# Patient Record
Sex: Male | Born: 1950 | Race: White | Hispanic: No | Marital: Married | Smoking: Former smoker
Health system: Southern US, Community
[De-identification: ages and names within clinical notes are randomized; demographics above are authoritative.]

## PROBLEM LIST (undated history)

## (undated) DIAGNOSIS — J189 Pneumonia, unspecified organism: Secondary | ICD-10-CM

## (undated) DIAGNOSIS — K219 Gastro-esophageal reflux disease without esophagitis: Secondary | ICD-10-CM

## (undated) DIAGNOSIS — C801 Malignant (primary) neoplasm, unspecified: Secondary | ICD-10-CM

## (undated) DIAGNOSIS — M1990 Unspecified osteoarthritis, unspecified site: Secondary | ICD-10-CM

## (undated) DIAGNOSIS — E785 Hyperlipidemia, unspecified: Secondary | ICD-10-CM

## (undated) DIAGNOSIS — L570 Actinic keratosis: Secondary | ICD-10-CM

## (undated) DIAGNOSIS — D125 Benign neoplasm of sigmoid colon: Secondary | ICD-10-CM

## (undated) DIAGNOSIS — K635 Polyp of colon: Secondary | ICD-10-CM

## (undated) DIAGNOSIS — K5792 Diverticulitis of intestine, part unspecified, without perforation or abscess without bleeding: Secondary | ICD-10-CM

## (undated) DIAGNOSIS — E349 Endocrine disorder, unspecified: Secondary | ICD-10-CM

## (undated) HISTORY — DX: Benign neoplasm of sigmoid colon: D12.5

## (undated) HISTORY — DX: Gastro-esophageal reflux disease without esophagitis: K21.9

## (undated) HISTORY — DX: Actinic keratosis: L57.0

## (undated) HISTORY — DX: Diverticulitis of intestine, part unspecified, without perforation or abscess without bleeding: K57.92

## (undated) HISTORY — PX: SHOULDER ARTHROSCOPY: SHX128

## (undated) HISTORY — DX: Unspecified osteoarthritis, unspecified site: M19.90

## (undated) HISTORY — DX: Polyp of colon: K63.5

## (undated) HISTORY — DX: Hyperlipidemia, unspecified: E78.5

## (undated) NOTE — *Deleted (*Deleted)
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  PROCEDURE:  Revision left total hip arthroplasty.  COMPONENTS USED:  DePuy hip system with a size 15 mm small stature AML 6-inch stem with a new 36+1.5 delta ceramic ball and a new 54 x 36+4 neutral AltrX liner.  SURGEON:  Durene Romans, MD  ASSISTANT:  Dennie Bible, PA-C.  Note that Ms. Adrian Blackwater was present for the entirety of the case from preoperative positioning, perioperative management of the operative extremity, general facilitation of the case and primary wound closure.  ANESTHESIA:  General.  SPECIMENS:  None.  COMPLICATIONS:  None noted.  BLOOD LOSS:  About 250 mL.  DRAINS:  None.  INDICATIONS:  The patient is a very pleasant 23 year old male with history of left total hip arthroplasty about 4 years ago.  He presented recently with complaints of knee and thigh pain.  Upon review of his radiographs, there was some concern not  necessarily of inherent knee pathology, but femoral component subsidence with pedestal formation distally and concerns for loosening around his femoral stem.  Comparing his immediate postoperative films to current films indicated concern for subsidence  or settling of the component.  Confirmational bone scan was ordered and indicated loosening of the stem.  We discussed the indications for surgery.  We discussed the risk of recurrence, lack of ingrowth given revised components.  We discussed the  postoperative course and expectation, reviewed standard risk of infection, DVT,  dislocation.  Consent was obtained for benefit of pain relief.  PROCEDURE IN DETAIL:  The patient was brought to the operative theater.  Once adequate anesthesia, preoperative antibiotics, Ancef administered as well as tranexamic  Dictation Ends Here  HN/NUANCE  D:06/22/2020 T:06/22/2020 JOB:[id number]

---
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
## 1988-08-05 HISTORY — PX: EYE SURGERY: SHX253

## 2006-09-15 ENCOUNTER — Ambulatory Visit (HOSPITAL_COMMUNITY): Admission: RE | Admit: 2006-09-15 | Discharge: 2006-09-15 | Payer: Self-pay | Admitting: Family Medicine

## 2006-09-29 ENCOUNTER — Ambulatory Visit: Payer: Self-pay | Admitting: Internal Medicine

## 2006-10-10 ENCOUNTER — Ambulatory Visit: Payer: Self-pay | Admitting: Internal Medicine

## 2006-10-10 HISTORY — PX: COLONOSCOPY: SHX174

## 2010-08-05 DIAGNOSIS — E349 Endocrine disorder, unspecified: Secondary | ICD-10-CM

## 2010-08-05 HISTORY — DX: Endocrine disorder, unspecified: E34.9

## 2011-09-19 ENCOUNTER — Encounter: Payer: Self-pay | Admitting: *Deleted

## 2011-09-24 ENCOUNTER — Ambulatory Visit (INDEPENDENT_AMBULATORY_CARE_PROVIDER_SITE_OTHER): Payer: BC Managed Care – PPO | Admitting: Cardiology

## 2011-09-24 ENCOUNTER — Encounter: Payer: Self-pay | Admitting: Cardiology

## 2011-09-24 DIAGNOSIS — Z01818 Encounter for other preprocedural examination: Secondary | ICD-10-CM

## 2011-09-24 DIAGNOSIS — E785 Hyperlipidemia, unspecified: Secondary | ICD-10-CM

## 2011-09-24 NOTE — Patient Instructions (Signed)
Start aspirin 81mg  daily after your surgery has been done.  Your physician recommends that you do not need to  schedule a follow-up appointment with Dr Shirlee Latch.

## 2011-09-25 DIAGNOSIS — Z01818 Encounter for other preprocedural examination: Secondary | ICD-10-CM | POA: Insufficient documentation

## 2011-09-25 DIAGNOSIS — E785 Hyperlipidemia, unspecified: Secondary | ICD-10-CM | POA: Insufficient documentation

## 2011-09-25 NOTE — Progress Notes (Signed)
PCP: Dr. Christell Constant  61 yo with history history of hyperlipidemia presents for cardiology evaluation prior to left THR with Dr. Charlann Boxer.   Patient in general is quite healthy.  No cardiopulmonary complaints.  He no longer jogs because of his hip pain but does not have any problems with walking briskly or climbing steps.  He swims for about 1/2 hour daily and lifts weights.  No chest pain or tightness.  No exertional dyspnea.  No history of syncope or lightheadedness.  He has never had heart trouble.  He does not smoke (quit years ago).  No family history of premature CAD.    ECG: NSR, normal  Labs (11/12): LDL-P 918 (low), LDL 74, HDL 56, TSH normal, K 5.6, creatinine 1.16  PMH: 1. Hyperlipidemia 2. Left hip OA 3. H/o diverticulitis 4. ETT about 5 years ago was normal.   FH: Father with dementia, mother with renal cell CA.  No family history of premature CAD.   SH: Married, 2 children.  Occasional ETOH, former smoker years ago.  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  Lives in [place].    ROS: All systems reviewed and negative except as per HPI.   Current Outpatient Prescriptions  Medication Sig Dispense Refill  . ANDROGEL PUMP 20.25 MG/ACT (1.62%) GEL Three times a day.      . cholecalciferol (VITAMIN D) 1000 UNITS tablet Take 1,000 Units by mouth daily.      . fish oil-omega-3 fatty acids 1000 MG capsule Take by mouth daily.      . multivitamin (THERAGRAN) per tablet Take 1 tablet by mouth daily.      Marland Kitchen SIMCOR 1000-40 MG TB24 Take 1,000 mg by mouth Daily.      Marland Kitchen aspirin (ASPIRIN CHILDRENS) 81 MG chewable tablet Chew 1 tablet (81 mg total) by mouth daily.        BP 110/72  Pulse 67  Ht 6\' 1"  (1.854 m)  Wt 201 lb 12.8 oz (91.536 kg)  BMI 26.62 kg/m2 General: NAD Neck: No JVD, no thyromegaly or thyroid nodule.  Lungs: Clear to auscultation bilaterally with normal respiratory effort. CV: Nondisplaced PMI.  Heart regular S1/S2, no S3/S4, no murmur.  No peripheral edema.  No carotid  bruit.  Normal pedal pulses.  Abdomen: Soft, nontender, no hepatosplenomegaly, no distention.  Skin: Intact without lesions or rashes.  Neurologic: Alert and oriented x 3.  Psych: Normal affect. Extremities: No clubbing or cyanosis.  HEENT: Normal.

## 2011-09-25 NOTE — Progress Notes (Signed)
H&P performed 09/25/2011 Dictation # [id number]

## 2011-09-25 NOTE — Assessment & Plan Note (Signed)
No cardiopulmonary limitations with excellent exercise tolerance.  ECG is normal.  No further testing needed prior to orthopedic surgery.  After surgery, would have him start ASA 81 mg daily.

## 2011-09-25 NOTE — Assessment & Plan Note (Signed)
Excellent lipid profile on Simcor.

## 2011-09-26 ENCOUNTER — Encounter (HOSPITAL_COMMUNITY): Payer: Self-pay | Admitting: Pharmacy Technician

## 2011-09-26 ENCOUNTER — Encounter (HOSPITAL_COMMUNITY): Payer: Self-pay

## 2011-09-26 ENCOUNTER — Encounter (HOSPITAL_COMMUNITY)
Admission: RE | Admit: 2011-09-26 | Discharge: 2011-09-26 | Disposition: A | Discharge status: Home / Self Care | Payer: BC Managed Care – PPO | Source: Ambulatory Visit | Attending: Orthopedic Surgery | Admitting: Orthopedic Surgery

## 2011-09-26 HISTORY — DX: Unspecified osteoarthritis, unspecified site: M19.90

## 2011-09-26 HISTORY — DX: Pneumonia, unspecified organism: J18.9

## 2011-09-26 LAB — COMPREHENSIVE METABOLIC PANEL
ALT: 34 U/L (ref 0–53)
AST: 37 U/L (ref 0–37)
Albumin: 4.4 g/dL (ref 3.5–5.2)
Alkaline Phosphatase: 60 U/L (ref 39–117)
BUN: 16 mg/dL (ref 6–23)
CO2: 29 mEq/L (ref 19–32)
Calcium: 9.7 mg/dL (ref 8.4–10.5)
Chloride: 102 mEq/L (ref 96–112)
Creatinine, Ser: 1.07 mg/dL (ref 0.50–1.35)
GFR calc Af Amer: 85 mL/min — ABNORMAL LOW (ref >90)
GFR calc non Af Amer: 74 mL/min — ABNORMAL LOW (ref >90)
Glucose, Bld: 104 mg/dL — ABNORMAL HIGH (ref 70–99)
Potassium: 3.9 mEq/L (ref 3.5–5.1)
Sodium: 140 mEq/L (ref 135–145)
Total Bilirubin: 1 mg/dL (ref 0.3–1.2)
Total Protein: 7.6 g/dL (ref 6.0–8.3)

## 2011-09-26 LAB — SURGICAL PCR SCREEN
MRSA, PCR: NEGATIVE
Staphylococcus aureus: NEGATIVE

## 2011-09-26 LAB — CBC
HCT: 44.9 % (ref 39.0–52.0)
Hemoglobin: 15.9 g/dL (ref 13.0–17.0)
MCH: 33.3 pg (ref 26.0–34.0)
MCHC: 35.4 g/dL (ref 30.0–36.0)
MCV: 93.9 fL (ref 78.0–100.0)
Platelets: 136 10*3/uL — ABNORMAL LOW (ref 150–400)
RBC: 4.78 MIL/uL (ref 4.22–5.81)
RDW: 12.7 % (ref 11.5–15.5)
WBC: 3.5 10*3/uL — ABNORMAL LOW (ref 4.0–10.5)

## 2011-09-26 LAB — URINALYSIS, ROUTINE W REFLEX MICROSCOPIC
Bilirubin Urine: NEGATIVE
Glucose, UA: NEGATIVE mg/dL
Hgb urine dipstick: NEGATIVE
Ketones, ur: NEGATIVE mg/dL
Leukocytes, UA: NEGATIVE
Nitrite: NEGATIVE
Protein, ur: NEGATIVE mg/dL
Specific Gravity, Urine: 1.028 (ref 1.005–1.030)
Urobilinogen, UA: 0.2 mg/dL (ref 0.0–1.0)
pH: 6 (ref 5.0–8.0)

## 2011-09-26 LAB — DIFFERENTIAL
Basophils Absolute: 0 10*3/uL (ref 0.0–0.1)
Basophils Relative: 1 % (ref 0–1)
Eosinophils Absolute: 0.1 10*3/uL (ref 0.0–0.7)
Eosinophils Relative: 2 % (ref 0–5)
Lymphocytes Relative: 36 % (ref 12–46)
Lymphs Abs: 1.3 10*3/uL (ref 0.7–4.0)
Monocytes Absolute: 0.3 10*3/uL (ref 0.1–1.0)
Monocytes Relative: 9 % (ref 3–12)
Neutro Abs: 1.8 10*3/uL (ref 1.7–7.7)
Neutrophils Relative %: 52 % (ref 43–77)

## 2011-09-26 LAB — PROTIME-INR
INR: 0.96 (ref 0.00–1.49)
Prothrombin Time: 13 seconds (ref 11.6–15.2)

## 2011-09-26 LAB — APTT: aPTT: 39 seconds — ABNORMAL HIGH (ref 24–37)

## 2011-09-26 MED ORDER — CEFAZOLIN SODIUM 1-5 GM-% IV SOLN: 1.0000 g | INTRAVENOUS | Status: DC

## 2011-09-26 MED ORDER — CHLORHEXIDINE GLUCONATE 4 % EX LIQD
60.0000 mL | Freq: Once | CUTANEOUS | Status: DC
  Filled 2011-09-26: qty 60

## 2011-09-26 NOTE — Pre-Procedure Instructions (Signed)
09/24/11 OV with Dr Shirlee Latch for clearance in [facility]

## 2011-09-26 NOTE — H&P (Signed)
[name], SNOWBALL                ACCOUNT NO.:  [id number]  MEDICAL RECORD NO.:  [id number]  LOCATION:                               FACILITY:  [facility]  PHYSICIAN:  Madlyn Frankel. Charlann Boxer, M.D.  DATE OF BIRTH:  1950/12/08  DATE OF ADMISSION:  10/01/2011 DATE OF DISCHARGE:                             HISTORY & PHYSICAL   DATE OF SURGERY:  October 01, 2011.  ADMITTING DIAGNOSIS:  Left hip osteoarthritis.  HISTORY OF PRESENT ILLNESS:  This is a 61 year old gentleman with a history of osteoarthritis of the left hip that has failed conservative management.  At this time, the patient is scheduled for total hip arthroplasty by anterior approach.  The surgery's benefits, and aftercare were discussed in detail with the patient, questions invited and answered.  Note that the patient's medical doctor is Dr. Rudi Heap.  He is a candidate for tranexamic acid and will receive that in preop.  He is planning on going home after surgery.  He was given his home medications of aspirin, iron, MiraLAX, Robaxin, and Colace.  PAST MEDICAL HISTORY:  Drug allergies none.  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  PREVIOUS SURGERIES:  None.  CURRENT MEDICATIONS: 1. Simcor 1000 mg daily, and 2. AndroGel 1.62% t.i.d.  FAMILY HISTORY:  Positive for Alzheimer's, and renal cell cancer.  SOCIAL HISTORY:  The patient is married.  He does not smoke and has 1-2 beers per day.  Again, he is planning on going home after surgery.  REVIEW OF SYSTEMS:  CENTRAL NERVOUS SYSTEM:  Negative for headache, blurred vision, or dizziness.  PULMONARY:  Negative for shortness breath, PND, and orthopnea.  CARDIOVASCULAR:  Negative for chest pain or palpitation.  GI:  Negative for ulcers, hepatitis.  GU:  Negative for urinary tract difficulty.  MUSCULOSKELETAL:  Positive as in HPI.  PHYSICAL EXAMINATION:  GENERAL:  This is a well-developed, well- nourished gentleman, in no acute  distress. VITAL SIGNS:  Blood pressure 145/88, respirations 14, pulse 72 and regular.  HEENT:  Head normocephalic.  Nose patent.  Ears patent. Pupils equal, round, reactive to light.  Throat without injection. NECK:  Supple without adenopathy.  Carotids 2+ without bruit. CHEST:  Clear to auscultation.  No rales or rhonchi.  Respirations 14. HEART:  Regular rate and rhythm at 72 beats per minute without murmur. ABDOMEN:  Soft.  Active bowel sounds.  No masses or organomegaly. NEUROLOGIC:  Patient alert, oriented to time, place and person.  Cranial nerves 2-12 grossly intact. EXTREMITIES:  Show the left hip with full extension, further flexion to 120 degrees, external rotation of 35, internal rotation of 20 with discomfort.  Dorsalis pedis and posterior tibialis pulses are 2+.  ASSESSMENT:  Osteoarthritis, left hip.  PLAN:  Total hip arthroplasty, left hip, by anterior approach.     Jaquelyn Bitter. Tymier Lindholm, P.A.   ______________________________ Madlyn Frankel Charlann Boxer, M.D.    SJC/MEDQ  D:  09/25/2011  T:  09/26/2011  Job:  [id number]

## 2011-09-26 NOTE — Patient Instructions (Signed)
[name]  09/26/2011   Your procedure is scheduled on:  10/01/11 1220pm-1350pm  Report to [facility] at 1000 AM.  Call this number if you have problems the morning of surgery: [phone]   Remember:   Do not eat food:After Midnight.  May have clear liquids:until Midnight .    Take these medicines the morning of surgery with A SIP OF WATER:    Do not wear jewelry,   Do not wear lotions, powders, or perfumes. .    Do not bring valuables to the hospital.  Contacts, dentures or bridgework may not be worn into surgery.  Leave suitcase in the car. After surgery it may be brought to your room.  For patients admitted to the hospital, checkout time is 11:00 AM the day of discharge.       Special Instructions: CHG Shower Use Special Wash: 1/2 bottle night before surgery and 1/2 bottle morning of surgery. shower chin to toes with CHG.  Wash face and private parts with regular soap.     Please read over the following fact sheets that you were given: MRSA Information, coughing and deep breathing exercises, leg exercises, Blood Transfusion Fact Sheet, Incentive Fact Sheet

## 2011-09-30 ENCOUNTER — Encounter (HOSPITAL_COMMUNITY): Payer: Self-pay | Admitting: Pharmacy Technician

## 2011-10-01 ENCOUNTER — Inpatient Hospital Stay (HOSPITAL_COMMUNITY): Payer: BC Managed Care – PPO

## 2011-10-01 ENCOUNTER — Encounter (HOSPITAL_COMMUNITY): Payer: Self-pay | Admitting: Anesthesiology

## 2011-10-01 ENCOUNTER — Encounter (HOSPITAL_COMMUNITY): Payer: Self-pay | Admitting: *Deleted

## 2011-10-01 ENCOUNTER — Inpatient Hospital Stay (HOSPITAL_COMMUNITY)
Admission: RE | Admit: 2011-10-01 | Discharge: 2011-10-02 | DRG: 818 | Disposition: A | Discharge status: Home Health | Payer: BC Managed Care – PPO | Source: Ambulatory Visit | Attending: Orthopedic Surgery | Admitting: Orthopedic Surgery

## 2011-10-01 ENCOUNTER — Inpatient Hospital Stay (HOSPITAL_COMMUNITY): Payer: BC Managed Care – PPO | Admitting: Anesthesiology

## 2011-10-01 ENCOUNTER — Encounter (HOSPITAL_COMMUNITY)
Admission: RE | Disposition: A | Discharge status: Home Health | Payer: Self-pay | Source: Ambulatory Visit | Attending: Orthopedic Surgery

## 2011-10-01 DIAGNOSIS — E349 Endocrine disorder, unspecified: Secondary | ICD-10-CM | POA: Diagnosis present

## 2011-10-01 DIAGNOSIS — E7800 Pure hypercholesterolemia, unspecified: Secondary | ICD-10-CM | POA: Diagnosis present

## 2011-10-01 DIAGNOSIS — M169 Osteoarthritis of hip, unspecified: Principal | ICD-10-CM | POA: Diagnosis present

## 2011-10-01 DIAGNOSIS — Z01812 Encounter for preprocedural laboratory examination: Secondary | ICD-10-CM

## 2011-10-01 DIAGNOSIS — Z96649 Presence of unspecified artificial hip joint: Secondary | ICD-10-CM

## 2011-10-01 DIAGNOSIS — Z79899 Other long term (current) drug therapy: Secondary | ICD-10-CM

## 2011-10-01 DIAGNOSIS — M1610 Unilateral primary osteoarthritis, unspecified hip: Principal | ICD-10-CM | POA: Diagnosis present

## 2011-10-01 HISTORY — PX: TOTAL HIP ARTHROPLASTY: SHX124

## 2011-10-01 LAB — ABO/RH: ABO/RH(D): O POS

## 2011-10-01 LAB — TYPE AND SCREEN
ABO/RH(D): O POS
Antibody Screen: NEGATIVE

## 2011-10-01 SURGERY — ARTHROPLASTY, HIP, TOTAL, ANTERIOR APPROACH
Anesthesia: General | Site: Hip | Laterality: Left | Wound class: Clean

## 2011-10-01 MED ORDER — DIPHENHYDRAMINE HCL 25 MG PO CAPS: 25.0000 mg | ORAL_CAPSULE | Freq: Four times a day (QID) | ORAL | Status: DC | PRN

## 2011-10-01 MED ORDER — LACTATED RINGERS IV SOLN
INTRAVENOUS | Status: DC | PRN
  Administered 2011-10-01 (×3): via INTRAVENOUS

## 2011-10-01 MED ORDER — PROMETHAZINE HCL 25 MG/ML IJ SOLN: 6.2500 mg | INTRAMUSCULAR | Status: DC | PRN

## 2011-10-01 MED ORDER — SIMVASTATIN 40 MG PO TABS
40.0000 mg | ORAL_TABLET | Freq: Every day | ORAL | Status: DC
  Administered 2011-10-02: 40 mg via ORAL
  Filled 2011-10-01: qty 1

## 2011-10-01 MED ORDER — LACTATED RINGERS IV SOLN
INTRAVENOUS | Status: DC
  Administered 2011-10-01: 16:00:00 via INTRAVENOUS

## 2011-10-01 MED ORDER — METHOCARBAMOL 500 MG PO TABS
500.0000 mg | ORAL_TABLET | Freq: Four times a day (QID) | ORAL | Status: DC | PRN
  Administered 2011-10-02: 500 mg via ORAL
  Filled 2011-10-01: qty 1

## 2011-10-01 MED ORDER — ACETAMINOPHEN 325 MG PO TABS: 650.0000 mg | ORAL_TABLET | Freq: Four times a day (QID) | ORAL | Status: DC | PRN

## 2011-10-01 MED ORDER — ONDANSETRON HCL 4 MG/2ML IJ SOLN: 4.0000 mg | Freq: Four times a day (QID) | INTRAMUSCULAR | Status: DC | PRN

## 2011-10-01 MED ORDER — PHENOL 1.4 % MT LIQD
1.0000 | OROMUCOSAL | Status: DC | PRN
  Filled 2011-10-01: qty 177

## 2011-10-01 MED ORDER — MIDAZOLAM HCL 5 MG/5ML IJ SOLN
INTRAMUSCULAR | Status: DC | PRN
  Administered 2011-10-01: 2 mg via INTRAVENOUS

## 2011-10-01 MED ORDER — HYDROCODONE-ACETAMINOPHEN 7.5-325 MG PO TABS
1.0000 | ORAL_TABLET | ORAL | Status: DC
  Administered 2011-10-01: 2 via ORAL
  Administered 2011-10-01: 1 via ORAL
  Administered 2011-10-02: 2 via ORAL
  Administered 2011-10-02: 1 via ORAL
  Administered 2011-10-02 (×2): 2 via ORAL
  Filled 2011-10-01 (×4): qty 2
  Filled 2011-10-01: qty 1
  Filled 2011-10-01: qty 2

## 2011-10-01 MED ORDER — MENTHOL 3 MG MT LOZG
1.0000 | LOZENGE | OROMUCOSAL | Status: DC | PRN
  Filled 2011-10-01: qty 9

## 2011-10-01 MED ORDER — ROCURONIUM BROMIDE 100 MG/10ML IV SOLN
INTRAVENOUS | Status: DC | PRN
  Administered 2011-10-01: 10 mg via INTRAVENOUS
  Administered 2011-10-01: 20 mg via INTRAVENOUS
  Administered 2011-10-01: 50 mg via INTRAVENOUS

## 2011-10-01 MED ORDER — TRANEXAMIC ACID 100 MG/ML IV SOLN
1360.0000 mg | INTRAVENOUS | Status: AC
  Administered 2011-10-01: 1360 mg via INTRAVENOUS
  Filled 2011-10-01: qty 13.6

## 2011-10-01 MED ORDER — METHOCARBAMOL 100 MG/ML IJ SOLN
500.0000 mg | Freq: Four times a day (QID) | INTRAVENOUS | Status: DC | PRN
  Administered 2011-10-01 (×2): 500 mg via INTRAVENOUS
  Filled 2011-10-01 (×3): qty 5

## 2011-10-01 MED ORDER — PROPOFOL 10 MG/ML IV EMUL
INTRAVENOUS | Status: DC | PRN
  Administered 2011-10-01: 200 mg via INTRAVENOUS

## 2011-10-01 MED ORDER — LIDOCAINE HCL (CARDIAC) 20 MG/ML IV SOLN
INTRAVENOUS | Status: DC | PRN
  Administered 2011-10-01: 60 mg via INTRAVENOUS
  Administered 2011-10-01: 100 mg via INTRAVENOUS

## 2011-10-01 MED ORDER — ACETAMINOPHEN 10 MG/ML IV SOLN
INTRAVENOUS | Status: DC | PRN
  Administered 2011-10-01: 1000 mg via INTRAVENOUS

## 2011-10-01 MED ORDER — RIVAROXABAN 10 MG PO TABS
10.0000 mg | ORAL_TABLET | ORAL | Status: DC
  Administered 2011-10-02: 10 mg via ORAL
  Filled 2011-10-01: qty 1

## 2011-10-01 MED ORDER — HYDROMORPHONE HCL PF 1 MG/ML IJ SOLN
INTRAMUSCULAR | Status: DC | PRN
  Administered 2011-10-01 (×2): 0.5 mg via INTRAVENOUS

## 2011-10-01 MED ORDER — FLEET ENEMA 7-19 GM/118ML RE ENEM: 1.0000 | ENEMA | Freq: Once | RECTAL | Status: AC | PRN

## 2011-10-01 MED ORDER — ACETAMINOPHEN 650 MG RE SUPP: 650.0000 mg | Freq: Four times a day (QID) | RECTAL | Status: DC | PRN

## 2011-10-01 MED ORDER — ONDANSETRON HCL 4 MG PO TABS: 4.0000 mg | ORAL_TABLET | Freq: Four times a day (QID) | ORAL | Status: DC | PRN

## 2011-10-01 MED ORDER — FENTANYL CITRATE 0.05 MG/ML IJ SOLN
INTRAMUSCULAR | Status: DC | PRN
  Administered 2011-10-01 (×2): 50 ug via INTRAVENOUS
  Administered 2011-10-01: 100 ug via INTRAVENOUS
  Administered 2011-10-01: 50 ug via INTRAVENOUS

## 2011-10-01 MED ORDER — BISACODYL 5 MG PO TBEC: 5.0000 mg | DELAYED_RELEASE_TABLET | Freq: Every day | ORAL | Status: DC | PRN

## 2011-10-01 MED ORDER — DOCUSATE SODIUM 100 MG PO CAPS
100.0000 mg | ORAL_CAPSULE | Freq: Two times a day (BID) | ORAL | Status: DC
  Administered 2011-10-01 – 2011-10-02 (×2): 100 mg via ORAL
  Filled 2011-10-01 (×3): qty 1

## 2011-10-01 MED ORDER — SODIUM CHLORIDE 0.9 % IV SOLN
100.0000 mL/h | INTRAVENOUS | Status: DC
  Administered 2011-10-01 – 2011-10-02 (×2): 100 mL/h via INTRAVENOUS
  Filled 2011-10-01 (×6): qty 1000

## 2011-10-01 MED ORDER — METOCLOPRAMIDE HCL 5 MG/ML IJ SOLN: 5.0000 mg | Freq: Three times a day (TID) | INTRAMUSCULAR | Status: DC | PRN

## 2011-10-01 MED ORDER — HYDROMORPHONE HCL PF 1 MG/ML IJ SOLN
INTRAMUSCULAR | Status: AC
  Administered 2011-10-01: 0.5 mg via INTRAVENOUS
  Filled 2011-10-01: qty 2

## 2011-10-01 MED ORDER — ALUM & MAG HYDROXIDE-SIMETH 200-200-20 MG/5ML PO SUSP: 30.0000 mL | ORAL | Status: DC | PRN

## 2011-10-01 MED ORDER — NIACIN-SIMVASTATIN ER 1000-40 MG PO TB24: 1000.0000 mg | ORAL_TABLET | Freq: Every morning | ORAL | Status: DC

## 2011-10-01 MED ORDER — FERROUS SULFATE 325 (65 FE) MG PO TABS
325.0000 mg | ORAL_TABLET | Freq: Three times a day (TID) | ORAL | Status: DC
  Administered 2011-10-01 – 2011-10-02 (×3): 325 mg via ORAL
  Filled 2011-10-01 (×4): qty 1

## 2011-10-01 MED ORDER — POLYETHYLENE GLYCOL 3350 17 G PO PACK
17.0000 g | PACK | Freq: Two times a day (BID) | ORAL | Status: DC
  Administered 2011-10-02: 17 g via ORAL
  Filled 2011-10-01 (×3): qty 1

## 2011-10-01 MED ORDER — GLYCOPYRROLATE 0.2 MG/ML IJ SOLN
INTRAMUSCULAR | Status: DC | PRN
  Administered 2011-10-01: 0.2 mg via INTRAVENOUS

## 2011-10-01 MED ORDER — ONDANSETRON HCL 4 MG/2ML IJ SOLN
INTRAMUSCULAR | Status: DC | PRN
  Administered 2011-10-01: 4 mg via INTRAVENOUS

## 2011-10-01 MED ORDER — CEFAZOLIN SODIUM-DEXTROSE 2-3 GM-% IV SOLR
2.0000 g | Freq: Once | INTRAVENOUS | Status: AC
  Administered 2011-10-01: 2 g via INTRAVENOUS

## 2011-10-01 MED ORDER — ZOLPIDEM TARTRATE 5 MG PO TABS: 5.0000 mg | ORAL_TABLET | Freq: Every evening | ORAL | Status: DC | PRN

## 2011-10-01 MED ORDER — CEFAZOLIN SODIUM-DEXTROSE 2-3 GM-% IV SOLR
2.0000 g | Freq: Four times a day (QID) | INTRAVENOUS | Status: AC
  Administered 2011-10-01 – 2011-10-02 (×3): 2 g via INTRAVENOUS
  Filled 2011-10-01 (×3): qty 50

## 2011-10-01 MED ORDER — HYDROMORPHONE HCL PF 1 MG/ML IJ SOLN: 0.5000 mg | INTRAMUSCULAR | Status: DC | PRN

## 2011-10-01 MED ORDER — METOCLOPRAMIDE HCL 10 MG PO TABS: 5.0000 mg | ORAL_TABLET | Freq: Three times a day (TID) | ORAL | Status: DC | PRN

## 2011-10-01 MED ORDER — NIACIN ER 500 MG PO CPCR
1000.0000 mg | ORAL_CAPSULE | Freq: Every day | ORAL | Status: DC
  Administered 2011-10-02: 1000 mg via ORAL
  Filled 2011-10-01: qty 2

## 2011-10-01 MED ORDER — HYDROMORPHONE HCL PF 1 MG/ML IJ SOLN
0.2500 mg | INTRAMUSCULAR | Status: DC | PRN
  Administered 2011-10-01 (×4): 0.5 mg via INTRAVENOUS

## 2011-10-01 SURGICAL SUPPLY — 45 items
ADH SKN CLS APL DERMABOND .7 (GAUZE/BANDAGES/DRESSINGS) ×1
BAG SPEC THK2 15X12 ZIP CLS (MISCELLANEOUS) ×2
BAG ZIPLOCK 12X15 (MISCELLANEOUS) ×4 IMPLANT
BLADE SAW SGTL 18X1.27X75 (BLADE) ×2 IMPLANT
CELLS DAT CNTRL 66122 CELL SVR (MISCELLANEOUS) IMPLANT
CLOTH BEACON ORANGE TIMEOUT ST (SAFETY) ×2 IMPLANT
DERMABOND ADVANCED (GAUZE/BANDAGES/DRESSINGS) ×1
DERMABOND ADVANCED .7 DNX12 (GAUZE/BANDAGES/DRESSINGS) ×1 IMPLANT
DRAPE C-ARM 42X72 X-RAY (DRAPES) ×2 IMPLANT
DRAPE STERI IOBAN 125X83 (DRAPES) ×2 IMPLANT
DRAPE U-SHAPE 47X51 STRL (DRAPES) ×6 IMPLANT
DRSG AQUACEL AG ADV 3.5X10 (GAUZE/BANDAGES/DRESSINGS) ×2 IMPLANT
DRSG TEGADERM 4X4.75 (GAUZE/BANDAGES/DRESSINGS) ×1 IMPLANT
DURAPREP 26ML APPLICATOR (WOUND CARE) ×2 IMPLANT
ELECT BLADE TIP CTD 4 INCH (ELECTRODE) ×2 IMPLANT
ELECT REM PT RETURN 9FT ADLT (ELECTROSURGICAL) ×2
ELECTRODE REM PT RTRN 9FT ADLT (ELECTROSURGICAL) ×1 IMPLANT
EVACUATOR 1/8 PVC DRAIN (DRAIN) ×1 IMPLANT
FACESHIELD LNG OPTICON STERILE (SAFETY) ×8 IMPLANT
GAUZE SPONGE 2X2 8PLY STRL LF (GAUZE/BANDAGES/DRESSINGS) ×1 IMPLANT
GLOVE BIOGEL PI IND STRL 7.5 (GLOVE) ×1 IMPLANT
GLOVE BIOGEL PI IND STRL 8 (GLOVE) ×1 IMPLANT
GLOVE BIOGEL PI INDICATOR 7.5 (GLOVE) ×1
GLOVE BIOGEL PI INDICATOR 8 (GLOVE) ×1
GLOVE ECLIPSE 8.0 STRL XLNG CF (GLOVE) ×3 IMPLANT
GLOVE INDICATOR 6.5 STRL GRN (GLOVE) ×1 IMPLANT
GLOVE ORTHO TXT STRL SZ7.5 (GLOVE) ×5 IMPLANT
GLOVE SURG SS PI 6.5 STRL IVOR (GLOVE) ×1 IMPLANT
GLOVE SURG SS PI 7.5 STRL IVOR (GLOVE) ×2 IMPLANT
GOWN BRE IMP PREV XXLGXLNG (GOWN DISPOSABLE) ×2 IMPLANT
GOWN STRL NON-REIN LRG LVL3 (GOWN DISPOSABLE) ×3 IMPLANT
GOWN STRL REIN XL XLG (GOWN DISPOSABLE) ×1 IMPLANT
KIT BASIN OR (CUSTOM PROCEDURE TRAY) ×2 IMPLANT
PACK TOTAL JOINT (CUSTOM PROCEDURE TRAY) ×2 IMPLANT
PADDING CAST COTTON 6X4 STRL (CAST SUPPLIES) ×2 IMPLANT
RETRACTOR WND ALEXIS 18 MED (MISCELLANEOUS) ×1 IMPLANT
RTRCTR WOUND ALEXIS 18CM MED (MISCELLANEOUS)
SPONGE GAUZE 2X2 STER 10/PKG (GAUZE/BANDAGES/DRESSINGS) ×1
SUCTION FRAZIER 12FR DISP (SUCTIONS) ×2 IMPLANT
SUT MNCRL AB 4-0 PS2 18 (SUTURE) ×2 IMPLANT
SUT VIC AB 1 CT1 36 (SUTURE) ×8 IMPLANT
SUT VIC AB 2-0 CT1 27 (SUTURE) ×4
SUT VIC AB 2-0 CT1 TAPERPNT 27 (SUTURE) ×2 IMPLANT
TOWEL OR 17X26 10 PK STRL BLUE (TOWEL DISPOSABLE) ×4 IMPLANT
TRAY FOLEY CATH 14FRSI W/METER (CATHETERS) ×2 IMPLANT

## 2011-10-01 NOTE — Op Note (Signed)
NAME:  [name]                ACCOUNT NO.: [id number]      MEDICAL RECORD NO.: [id number]      FACILITY:  [facility]      PHYSICIAN:  Durene Romans D  DATE OF BIRTH:  [birth year]     DATE OF PROCEDURE:  10/01/2011                                 OPERATIVE REPORT         PREOPERATIVE DIAGNOSIS: Left  hip osteoarthritis.      POSTOPERATIVE DIAGNOSIS:  Left hip osteoarthritis.      PROCEDURE:  Left total hip replacement through an anterior approach   utilizing DePuy THR system, component size 54mm pinnacle cup, a size 36+4 neutral   Altrex liner, a size 8Hi Tri Lock stem with a 36+1.5 delta ceramic   ball.      SURGEON:  Madlyn Frankel. Charlann Boxer, M.D.      ASSISTANT:  Lanney Gins, PA      ANESTHESIA:  General.      SPECIMENS:  None.      COMPLICATIONS:  None.      BLOOD LOSS:  800 cc     DRAINS:  One Hemovac.      INDICATION OF THE PROCEDURE:  [name] is a 61 y.o. male who had   presented to office for evaluation of left hip pain.  Radiographs revealed   progressive degenerative changes with bone-on-bone   articulation to the  hip joint.  The patient had painful limited range of   motion significantly affecting their overall quality of life.  The patient was failing to    respond to conservative measures, and at this point was ready   to proceed with more definitive measures.  The patient has noted progressive   degenerative changes in his hip, progressive problems and dysfunction   with regarding the hip prior to surgery.  Consent was obtained for   benefit of pain relief.  Specific risk of infection, DVT, component   failure, dislocation, need for revision surgery, as well discussion of   the anterior versus posterior approach were reviewed.  Consent was   obtained for benefit of anterior pain relief through an anterior   approach.      PROCEDURE IN DETAIL:  The patient was brought to operative theater.   Once adequate anesthesia, preoperative antibiotics, 2gm Ancef  administered.   The patient was positioned supine on the OSI Hanna table.  Once adequate   padding of boney process was carried out, we had predraped out the hip, and  used fluoroscopy to confirm orientation of the pelvis and position.      The left hip was then prepped and draped from proximal iliac crest to   mid thigh with shower curtain technique.      Time-out was performed identifying the patient, planned procedure, and   extremity.     An incision was then made 2 cm distal and lateral to the   anterior superior iliac spine extending over the orientation of the   tensor fascia lata muscle and sharp dissection was carried down to the   fascia of the muscle and protractor placed in the soft tissues.      The fascia was then incised.  The muscle belly was identified and swept   laterally  and retractor placed along the superior neck.  Following   cauterization of the circumflex vessels and removing some pericapsular   fat, a second cobra retractor was placed on the inferior neck.  A third   retractor was placed on the anterior acetabulum after elevating the   anterior rectus.  A L-capsulotomy was along the line of the   superior neck to the trochanteric fossa, then extended proximally and   distally.  Tag sutures were placed and the retractors were then placed   intracapsular.  We then identified the trochanteric fossa and   orientation of my neck cut, confirmed this radiographically   and then made a neck osteotomy with the femur on traction.  The femoral   head was removed without difficulty or complication.  Traction was let   off and retractors were placed posterior and anterior around the   acetabulum.      The labrum and foveal tissue were debrided.  I began reaming with a 47mm   reamer and reamed up to 53mm reamer with good bony bed preparation and a 54mm   cup was chosen.  The final 54 Pinnacle cup was then impacted under fluoroscopy  to confirm the depth of penetration and  orientation with respect to   abduction.  A screw was placed followed by the hole eliminator.  The final   36+4 neutral Altrex liner was impacted with good visualized rim fit.  The cup was positioned anatomically within the acetabular portion of the pelvis.      At this point, the femur was rolled at 80 degrees.  Further capsule was   released off the inferior aspect of the femoral neck.  I then   released the superior capsule proximally.  The hook was placed laterally   along the femur and elevated manually and held in position with the bed   hook.  The leg was then extended and adducted with the leg rolled to 100   degrees of external rotation.  Once the proximal femur was fully   exposed, I used a box osteotome to set orientation.  I then began   broaching with the starting chili pepper broach and passed this by hand and then broached up to 8.  With the 8 broach in place I chose a high offset neck with a 36+1.5 ball and did a trial reduction.  The offset was appropriate, leg lengths   appeared to be equal, confirmed radiographically.   Given these findings, I went ahead and dislocated the hip, repositioned all   retractors and positioned the right hip in the extended and abducted position.  The final 8 Hi  Tri Lock stem was   chosen and it was impacted down to the level of neck cut.  Based on this   and the trial reduction, a 36+1.5 delta ceramic ball was chosen and   impacted onto a clean and dry trunnion, and the hip was reduced.  The   hip had been irrigated throughout the case again at this point.  I did   reapproximate the superior capsular leaflet to the anterior leaflet   using #1 Vicryl, placed a medium Hemovac drain deep.  The fascia of the   tensor fascia lata muscle was then reapproximated using #1 Vicryl.  The   remaining wound was closed with 2-0 Vicryl and running 4-0 Monocryl.   The hip was cleaned, dried, and dressed sterilely using Dermabond and   Aquacel dressing.  Drain  site dressed  separately.  She was then brought   to recovery room in stable condition tolerating the procedure well.    Lanney Gins, PA-C was present for the entirety of the case involved from   preoperative positioning, perioperative retractor management, general   facilitation of the case, as well as primary wound closure as assistant.            Madlyn Frankel Charlann Boxer, M.D.            MDO/MEDQ  D:  05/28/2011  T:  05/28/2011  Job:  [id number]      Electronically Signed by Durene Romans M.D. on 06/03/2011 09:15:38 AM

## 2011-10-01 NOTE — Preoperative (Signed)
Beta Blockers   Reason not to administer Beta Blockers:Not Applicable 

## 2011-10-01 NOTE — Anesthesia Postprocedure Evaluation (Signed)
  Anesthesia Post-op Note  Patient: [name]  Procedure(s) Performed: Procedure(s) (LRB): TOTAL HIP ARTHROPLASTY ANTERIOR APPROACH (Left)  Patient Location: PACU  Anesthesia Type: General  Level of Consciousness: awake and alert   Airway and Oxygen Therapy: Patient Spontanous Breathing  Post-op Pain: mild  Post-op Assessment: Post-op Vital signs reviewed, Patient's Cardiovascular Status Stable, Respiratory Function Stable, Patent Airway and No signs of Nausea or vomiting  Post-op Vital Signs: stable  Complications: No apparent anesthesia complications

## 2011-10-01 NOTE — Anesthesia Preprocedure Evaluation (Addendum)
Anesthesia Evaluation  Patient identified by MRN, date of birth, ID band Patient awake    Reviewed: Allergy & Precautions, H&P , NPO status , Patient's Chart, lab work & pertinent test results  Airway Mallampati: II TM Distance: >3 FB Neck ROM: Full    Dental No notable dental hx.    Pulmonary  clear to auscultation  Pulmonary exam normal       Cardiovascular neg cardio ROS Regular Normal    Neuro/Psych Negative Neurological ROS  Negative Psych ROS   GI/Hepatic negative GI ROS, Neg liver ROS,   Endo/Other  Negative Endocrine ROS  Renal/GU negative Renal ROS  Genitourinary negative   Musculoskeletal negative musculoskeletal ROS (+)   Abdominal   Peds negative pediatric ROS (+)  Hematology negative hematology ROS (+)   Anesthesia Other Findings   Reproductive/Obstetrics negative OB ROS                           Anesthesia Physical Anesthesia Plan  ASA: I  Anesthesia Plan: General   Post-op Pain Management:    Induction: Intravenous  Airway Management Planned: Oral ETT  Additional Equipment:   Intra-op Plan:   Post-operative Plan: Extubation in OR  Informed Consent: I have reviewed the patients History and Physical, chart, labs and discussed the procedure including the risks, benefits and alternatives for the proposed anesthesia with the patient or authorized representative who has indicated his/her understanding and acceptance.   Dental advisory given  Plan Discussed with: CRNA  Anesthesia Plan Comments:         Anesthesia Quick Evaluation

## 2011-10-01 NOTE — Transfer of Care (Signed)
Immediate Anesthesia Transfer of Care Note  Patient: [name]  Procedure(s) Performed: Procedure(s) (LRB): TOTAL HIP ARTHROPLASTY ANTERIOR APPROACH (Left)  Patient Location: PACU  Anesthesia Type: General  Level of Consciousness: awake, alert , oriented and patient cooperative  Airway & Oxygen Therapy: Patient Spontanous Breathing and Patient connected to face mask oxygen  Post-op Assessment: Report given to PACU RN, Post -op Vital signs reviewed and stable and Patient moving all extremities X 4  Post vital signs: Reviewed and stable  Complications: No apparent anesthesia complications

## 2011-10-01 NOTE — Interval H&P Note (Signed)
History and Physical Interval Note:  10/01/2011 8:44 AM  [name]  has presented today for surgery, with the diagnosis of Left Hip Osetoarthritis  The various methods of treatment have been discussed with the patient and family. After consideration of risks, benefits and other options for treatment, the patient has consented to  Procedure(s) (LRB): LEFT TOTAL HIP ARTHROPLASTY ANTERIOR APPROACH (Left) as a surgical intervention .  The patients' history has been reviewed, patient examined, no change in status, stable for surgery.  I have reviewed the patients' chart and labs.  Questions were answered to the patient's satisfaction.     Shelda Pal

## 2011-10-02 LAB — BASIC METABOLIC PANEL
BUN: 9 mg/dL (ref 6–23)
CO2: 29 mEq/L (ref 19–32)
Calcium: 8.3 mg/dL — ABNORMAL LOW (ref 8.4–10.5)
Chloride: 106 mEq/L (ref 96–112)
Creatinine, Ser: 1.15 mg/dL (ref 0.50–1.35)
GFR calc Af Amer: 78 mL/min — ABNORMAL LOW (ref >90)
GFR calc non Af Amer: 67 mL/min — ABNORMAL LOW (ref >90)
Glucose, Bld: 115 mg/dL — ABNORMAL HIGH (ref 70–99)
Potassium: 3.7 mEq/L (ref 3.5–5.1)
Sodium: 140 mEq/L (ref 135–145)

## 2011-10-02 LAB — CBC
HCT: 35 % — ABNORMAL LOW (ref 39.0–52.0)
Hemoglobin: 12.1 g/dL — ABNORMAL LOW (ref 13.0–17.0)
MCH: 32.1 pg (ref 26.0–34.0)
MCHC: 34.6 g/dL (ref 30.0–36.0)
MCV: 92.8 fL (ref 78.0–100.0)
Platelets: 120 10*3/uL — ABNORMAL LOW (ref 150–400)
RBC: 3.77 MIL/uL — ABNORMAL LOW (ref 4.22–5.81)
RDW: 12.5 % (ref 11.5–15.5)
WBC: 8 10*3/uL (ref 4.0–10.5)

## 2011-10-02 MED ORDER — DSS 100 MG PO CAPS: 100.0000 mg | ORAL_CAPSULE | Freq: Two times a day (BID) | ORAL | Status: AC

## 2011-10-02 MED ORDER — ASPIRIN EC 325 MG PO TBEC: 325.0000 mg | DELAYED_RELEASE_TABLET | Freq: Two times a day (BID) | ORAL | Status: DC

## 2011-10-02 MED ORDER — DIPHENHYDRAMINE HCL 25 MG PO CAPS: 25.0000 mg | ORAL_CAPSULE | Freq: Four times a day (QID) | ORAL | Status: DC | PRN

## 2011-10-02 MED ORDER — POLYETHYLENE GLYCOL 3350 17 G PO PACK: 17.0000 g | PACK | Freq: Two times a day (BID) | ORAL | Status: AC

## 2011-10-02 MED ORDER — METHOCARBAMOL 500 MG PO TABS: 500.0000 mg | ORAL_TABLET | Freq: Four times a day (QID) | ORAL | Status: AC | PRN

## 2011-10-02 MED ORDER — HYDROCODONE-ACETAMINOPHEN 7.5-325 MG PO TABS: 1.0000 | ORAL_TABLET | ORAL | Status: AC

## 2011-10-02 MED ORDER — FERROUS SULFATE 325 (65 FE) MG PO TABS: 325.0000 mg | ORAL_TABLET | Freq: Three times a day (TID) | ORAL | Status: DC

## 2011-10-02 NOTE — Evaluation (Signed)
Physical Therapy Evaluation Patient Details Name: [name] MRN: [id number] DOB: June 29, 1951 Today's Date: 10/02/2011  Problem List:  Patient Active Problem List  Diagnoses  . Hyperlipidemia  . Preoperative evaluation of a medical condition to rule out surgical contraindications (TAR required)  . S/P left THA, AA    Past Medical History:  Past Medical History  Diagnosis Date  . Pneumonia     hx of years ago   . Arthritis     hip , shoulders    Past Surgical History: History reviewed. No pertinent past surgical history.  PT Assessment/Plan/Recommendation PT Assessment Clinical Impression Statement: pt is s/p L THA dir. ant. ambulated first time OOB.pt will benefit from PT to improve functional I reo, strength to DC to home. PT Recommendation/Assessment: Patient will need skilled PT in the acute care venue PT Problem List: Decreased strength;Decreased range of motion;Decreased activity tolerance;Decreased knowledge of use of DME PT Therapy Diagnosis : Difficulty walking PT Plan PT Frequency: 7X/week PT Recommendation Recommendations for Other Services: OT consult Follow Up Recommendations: Home health PT Equipment Recommended:  (crutches) PT Goals  Acute Rehab PT Goals PT Goal Formulation: With patient Time For Goal Achievement: 3 days Pt will go Supine/Side to Sit: Independently PT Goal: Supine/Side to Sit - Progress: Goal set today Pt will go Sit to Supine/Side: Independently PT Goal: Sit to Supine/Side - Progress: Goal set today Pt will go Sit to Stand: with supervision PT Goal: Sit to Stand - Progress: Goal set today Pt will go Stand to Sit: with supervision PT Goal: Stand to Sit - Progress: Goal set today Pt will Ambulate: >150 feet;with supervision;with least restrictive assistive device PT Goal: Ambulate - Progress: Goal set today Pt will Go Up / Down Stairs: 1-2 stairs;with supervision;with least restrictive assistive device PT Goal: Up/Down Stairs -  Progress: Goal set today Pt will Perform Home Exercise Program: Independently PT Goal: Perform Home Exercise Program - Progress: Goal set today  PT Evaluation Precautions/Restrictions    Prior Functioning  Home Living Lives With: Spouse Receives Help From: Family Type of Home: House Home Layout: One level Home Access: Stairs to enter Entrance Stairs-Rails: None Entrance Stairs-Number of Steps: 1 Home Adaptive Equipment: Walker - rolling Prior Function Level of Independence: Independent with basic ADLs Able to Take Stairs?: Yes Driving: Yes Vocation: Full time employment Cognition Cognition Overall Cognitive Status: Appears within functional limits for tasks assessed Orientation Level: Oriented X4 Sensation/Coordination   Extremity Assessment RLE Assessment RLE Assessment: Within Functional Limits LLE Assessment LLE Assessment: Exceptions to [facility] LLE Strength LLE Overall Strength Comments: pt able to flex knee in supine to 80 degr. with no assist. Mobility (including Balance) Bed Mobility Bed Mobility: Yes Supine to Sit: 4: Min assist;HOB flat;With rails Transfers Transfers: Yes Sit to Stand: 4: Min assist;From elevated surface;From bed;With upper extremity assist Sit to Stand Details (indicate cue type and reason): vc topush from bed Stand to Sit: 5: Supervision;With upper extremity assist;To chair/3-in-1;With armrests Stand to Sit Details: vc for LLE placed for comfort prior to sitting. Ambulation/Gait Ambulation/Gait: Yes Ambulation/Gait Assistance: 4: Min assist Ambulation/Gait Assistance Details (indicate cue type and reason): vc for position inside RWQ and step length on LLE Ambulation Distance (Feet): 200 Feet Assistive device: Rolling walker Gait Pattern: Step-through pattern Gait velocity: normal    Exercise  Total Joint Exercises Ankle Circles/Pumps: AROM;20 reps;Supine Quad Sets: AROM;10 reps;Supine Short Arc Quad: AROM;Left;10 reps;Supine Heel  Slides: AROM;Left;20 reps;Supine Hip ABduction/ADduction: AROM;Right;10 reps;Supine End of Session PT - End  of Session Activity Tolerance: Patient tolerated treatment well Patient left: in chair;with call bell in reach Nurse Communication: Mobility status for transfers General Behavior During Session: [facility] for tasks performed Cognition: [facility] for tasks performed  Rada Hay 10/02/2011, 11:04 AM

## 2011-10-02 NOTE — Progress Notes (Signed)
D/c instructions given and explained to pt.  Prescription for norco given to pt, placed in pt's hands.  Iv site d/c, placed pressure dressing over site.  Pt stable and ready for d/c.  Pt has 3-n-1 and crutches.  Pt d/c home with wife at this time.

## 2011-10-02 NOTE — Progress Notes (Signed)
CARE MANAGEMENT NOTE 10/02/2011  Patient:  [name], [name]   Account Number:  [id number]  Date Initiated:  10/02/2011  Documentation initiated by:  Colleen Can  Subjective/Objective Assessment:   dx total hip replacemnt-left, anterior approach     Action/Plan:   CM spoke with patient. Plans are for patient to return to his home in [place] where his spouse will be caregiver. Already has crutches and #n1 in room. States he will not require RW. Wants [facility] for [facility] services   Anticipated DC Date:  10/02/2011   Anticipated DC Plan:  HOME W HOME HEALTH SERVICES  In-house referral  NA      DC Planning Services  CM consult      [facility] Choice  HOME HEALTH   Choice offered to / List presented to:  C-1 Patient   DME arranged  NA      DME agency  NA     HH arranged  HH-2 PT      [facility] agency  [facility]   Status of service:  Completed, signed off Medicare Important Message given?  NO (If response is "NO", the following Medicare IM given date fields will be blank) Date Medicare IM given:   Date Additional Medicare IM given:    Discharge Disposition:  HOME W HOME HEALTH SERVICES  Per UR Regulation:    Comments:  List of agencies placed in shadow chart . Genevieve Norlander will start [facility] services tomorrow 10/03/11.

## 2011-10-02 NOTE — Progress Notes (Signed)
Subjective: 1 Day Post-Op Procedure(s) (LRB): TOTAL HIP ARTHROPLASTY ANTERIOR APPROACH (Left)   Patient reports pain as mild. No events. Ready to be discharged home.  Objective:   VITALS:   Filed Vitals:   10/02/11 0645  BP: 113/67  Pulse: 59  Temp: 98.5 F (36.9 C)  Resp: 16    Neurovascular intact Dorsiflexion/Plantar flexion intact Incision: dressing C/D/I No cellulitis present Compartment soft  LABS  Basename 10/02/11 0435  HGB 12.1*  HCT 35.0*  WBC 8.0  PLT 120*     Basename 10/02/11 0435  NA 140  K 3.7  BUN 9  CREATININE 1.15  GLUCOSE 115*     Assessment/Plan: 1 Day Post-Op Procedure(s) (LRB): TOTAL HIP ARTHROPLASTY ANTERIOR APPROACH (Left)   HV drain d/c'ed Foley cath d/c'ed Advance diet Up with therapy D/C IV fluids Discharge home with home health today Follow up in 2 weeks at [facility].  Follow-up Information    Follow up with OLIN,Havier Deeb D in 2 weeks.   Contact information:   [facility] [address], [address] [place] [address] [phone]          Anastasio Auerbach. Darell Saputo   PAC  10/02/2011, 9:32 AM

## 2011-10-02 NOTE — Discharge Summary (Signed)
Physician Discharge Summary  Patient ID: [name] MRN: [id number] DOB/AGE: 09/24/1950 61 y.o.  Admit date: 10/01/2011 Discharge date: 10/02/2011  Procedures:  Procedure(s) (LRB): TOTAL HIP ARTHROPLASTY ANTERIOR APPROACH (Left)  Attending Physician: Shelda Pal, MD   Admission Diagnoses: Left hip osteoarthritis  Discharge Diagnoses:  Principal Problem:  *S/P left THA, AA Hypercholesterolemia Hypotestosteronemia  HPI: This is a 61 year old gentleman with a history of osteoarthritis of the left hip that has failed conservative management. At this time, the patient is scheduled for total hip arthroplasty by anterior approach. The surgery's benefits, and aftercare were discussed in detail with the patient, questions invited and answered. Note that the patient's medical doctor is Dr. Rudi Heap. He is a candidate for tranexamic acid and will receive that in preop.  He is planning on going home after surgery. He was given his home medications of aspirin, iron, MiraLAX, Robaxin, and Colace.  PCP: Rudi Heap, MD, MD   Discharged Condition: good  Hospital Course:  Patient underwent the above stated procedure on 10/01/2011. Patient tolerated the procedure well and brought to the recovery room in good condition and subsequently to the floor.  POD #1 BP: 113/67 ; Pulse: 59 ; Temp: 98.5 F (36.9 C) ; Resp: 16 Pt's foley was removed, as well as the hemovac drain removed. IV was changed to a saline lock. Patient reports pain as mild. No events. Ready to be discharged home. Neurovascular intact, dorsiflexion/plantar flexion intact, incision: dressing C/D/I, no cellulitis present and compartment soft.   LABS  Basename  10/02/11 0435   HGB  12.1  HCT  35.0    Discharge Exam: General appearance: alert, cooperative and no distress Extremities: Homans sign is negative, no sign of DVT, no edema, redness or tenderness in the calves or thighs and no ulcers, gangrene or trophic  changes  Disposition: Home with HHPT, with follow up in 2 weeks  Follow-up Information    Follow up with OLIN,Ann-Marie Kluge D in 2 weeks.   Contact information:   [facility] [address] [place] [address] [phone]          Discharge Orders    Future Orders Please Complete By Expires   Diet - low sodium heart healthy      Call MD / Call 911      Comments:   If you experience chest pain or shortness of breath, CALL 911 and be transported to the hospital emergency room.  If you develope a fever above 101 F, pus (white drainage) or increased drainage or redness at the wound, or calf pain, call your surgeon's office.   Discharge instructions      Comments:   Maintain surgical dressing for 8 days, then replace with gauze and tape. Keep the area dry and clean until follow up. Follow up in 2 weeks at [facility]. Call with any questions or concerns.     Constipation Prevention      Comments:   Drink plenty of fluids.  Prune juice may be helpful.  You may use a stool softener, such as Colace (over the counter) 100 mg twice a day.  Use MiraLax (over the counter) for constipation as needed.   Increase activity slowly as tolerated      Weight Bearing as taught in Physical Therapy      Comments:   Use a walker or crutches as instructed.   Driving restrictions      Comments:   No driving for 4  weeks   Change dressing      Comments:   Maintain surgical dressing for 8 days, then replace with 4x4 guaze and tape. Keep the area dry and clean.   TED hose      Comments:   Use stockings (TED hose) for 2 weeks on both leg(s).  You may remove them at night for sleeping.      Current Discharge Medication List    START taking these medications   Details  aspirin EC 325 MG tablet Take 1 tablet (325 mg total) by mouth 2 (two) times daily. X 4 weeks Qty: 60 tablet, Refills: 0    diphenhydrAMINE (BENADRYL) 25 mg capsule Take 1 capsule  (25 mg total) by mouth every 6 (six) hours as needed for itching, allergies or sleep.    docusate sodium 100 MG CAPS Take 100 mg by mouth 2 (two) times daily.    ferrous sulfate 325 (65 FE) MG tablet Take 1 tablet (325 mg total) by mouth 3 (three) times daily after meals.    HYDROcodone-acetaminophen (NORCO) 7.5-325 MG per tablet Take 1-2 tablets by mouth every 4 (four) hours. Qty: 120 tablet, Refills: 0    methocarbamol (ROBAXIN) 500 MG tablet Take 1 tablet (500 mg total) by mouth every 6 (six) hours as needed (muscle spasms).    polyethylene glycol (MIRALAX / GLYCOLAX) packet Take 17 g by mouth 2 (two) times daily.      CONTINUE these medications which have NOT CHANGED   Details  ANDROGEL PUMP 20.25 MG/ACT (1.62%) GEL Three times a day.    cholecalciferol (VITAMIN D) 1000 UNITS tablet Take 1,000 Units by mouth every morning.     SIMCOR 1000-40 MG TB24 Take 1,000 mg by mouth every morning.     fish oil-omega-3 fatty acids 1000 MG capsule Take 1 g by mouth every morning.     multivitamin (THERAGRAN) per tablet Take 1 tablet by mouth every morning.       STOP taking these medications     aspirin (ASPIRIN CHILDRENS) 81 MG chewable tablet Comments:  Reason for Stopping:          Signed: Anastasio Auerbach. Narciso Stoutenburg   PAC  10/02/2011, 9:39 AM

## 2011-10-04 ENCOUNTER — Encounter (HOSPITAL_COMMUNITY): Payer: Self-pay | Admitting: Orthopedic Surgery

## 2012-01-04 ENCOUNTER — Encounter: Payer: Self-pay | Admitting: Cardiology

## 2012-01-28 ENCOUNTER — Ambulatory Visit (INDEPENDENT_AMBULATORY_CARE_PROVIDER_SITE_OTHER): Payer: BC Managed Care – PPO | Admitting: Internal Medicine

## 2012-01-28 ENCOUNTER — Encounter: Payer: Self-pay | Admitting: Internal Medicine

## 2012-01-28 VITALS — BP 138/72 | HR 64 | Ht 73.0 in | Wt 200.0 lb

## 2012-01-28 DIAGNOSIS — R12 Heartburn: Secondary | ICD-10-CM

## 2012-01-28 NOTE — Patient Instructions (Addendum)
Please read the GERD hand out you have been given today and try to follow the suggestions.  If symptoms become more frequent or worsen come back and see [place].  You are due for your colon in 2018.

## 2012-01-28 NOTE — Progress Notes (Signed)
Subjective:    Patient ID: [name], male    DOB: 09-01-1950, 61 y.o.   MRN: [id number] Referred by: Ernestina Penna, MD  HPI The patient is a pleasant middle-aged white man sent for evaluation of heartburn and reflux symptoms. At 2 months ago he took a Z-Pak and developed nausea and stomach upset increased heartburn. He was mentioning this to his primary care physician, Dr. Christell Constant and a recent visit. He was referred for evaluation of GERD symptoms. Since that time he is actually had less problems. About once a week he has heartburn. He drinks about 5 cups of coffee a day. The heartburn, when it occurs response to a glass of water. He does not have dysphagia or bleeding or unintentional weight loss in his GI review of systems is otherwise negative.  He had a screening colonoscopy about 5 years ago, he had 2 diminutive hyperplastic polyps in the sigmoid.  No Known Allergies Outpatient Prescriptions Prior to Visit  Medication Sig Dispense Refill  . ANDROGEL PUMP 20.25 MG/ACT (1.62%) GEL Three times a day.      . cholecalciferol (VITAMIN D) 1000 UNITS tablet Take 2,000 Units by mouth every morning.       . fish oil-omega-3 fatty acids 1000 MG capsule Take 1 g by mouth every morning.       Marland Kitchen SIMCOR 1000-40 MG TB24 Take 1,000 mg by mouth every morning.       Marland Kitchen aspirin EC 325 MG tablet Take 1 tablet (325 mg total) by mouth 2 (two) times daily. X 4 weeks  60 tablet  0  . diphenhydrAMINE (BENADRYL) 25 mg capsule Take 1 capsule (25 mg total) by mouth every 6 (six) hours as needed for itching, allergies or sleep.      . ferrous sulfate 325 (65 FE) MG tablet Take 1 tablet (325 mg total) by mouth 3 (three) times daily after meals.      . multivitamin (THERAGRAN) per tablet Take 1 tablet by mouth every morning.        Past Medical History  Diagnosis Date  . Pneumonia     hx of years ago   . Arthritis     hip , shoulders   . Hyperlipidemia   . Diverticulitis   . Osteoarthritis     left Hip  .  Polyp, sigmoid colon  hyperplastic and diminutive   . Actinic keratosis   . GERD (gastroesophageal reflux disease)    Past Surgical History  Procedure Date  . Total hip arthroplasty 10/01/2011    Procedure: TOTAL HIP ARTHROPLASTY ANTERIOR APPROACH;  Surgeon: Shelda Pal, MD;  Location: [facility];  Service: Orthopedics;  Laterality: Left;  . Colonoscopy    History   Social History  . Marital Status: Married    Spouse Name: N/A    Number of Children: 2  .     Occupational History  . CEO    Social History Main Topics  . Smoking status: Never Smoker   . Smokeless tobacco: Never Used  . Alcohol Use: 0.0 oz/week     sometimes  . Drug Use: No  . Sexually Active: None          Social History Narrative   Daily caffeine 5 cups of coffee    Family History  Problem Relation Age of Onset  . Kidney cancer Mother 60  . Colon cancer Neg Hx   . Alzheimer's disease Father 72       Review of Systems  As per history of present illness, all other review of systems negative.    Objective:   Physical Exam General:  NAD Eyes:   anicteric Lungs:  clear Heart:  S1S2 no rubs, murmurs or gallops Abdomen:  soft and nontender, BS+ Ext:   no edema Neuro/psych: Alert and oriented x3 normal affect    Data Reviewed:  CBC on 01/10/2012 shows a white count of 3.4 platelets 1 28,000 otherwise normal hemoglobin and hematocrit. TSH was normal. Comprehensive metabolic panel was normal.        Assessment & Plan:   1. Heartburn    He has some occasional or rare heartburn at this point. I think he had more symptoms related to side effects from azithromycin. At this point he is advised to reduce caffeine, and follow lifestyle changes for heartburn. I will see him back on an as-needed basis.  Note he really does not need a routine preventative colonoscopy for another 5 years or so as he had diminutive hyperplastic sigmoid polyps which do not raise his risk of cancer. It is not unreasonable  to start screening Hemoccult at this point after 5 years but again that is optional.  I appreciate the opportunity to care for this patient.   CC: Rudi Heap, MD

## 2012-10-29 ENCOUNTER — Telehealth: Payer: Self-pay | Admitting: Family Medicine

## 2012-10-30 NOTE — Telephone Encounter (Signed)
appt scheduled

## 2012-11-23 ENCOUNTER — Encounter: Payer: Self-pay | Admitting: *Deleted

## 2012-11-23 ENCOUNTER — Other Ambulatory Visit (INDEPENDENT_AMBULATORY_CARE_PROVIDER_SITE_OTHER): Payer: BC Managed Care – PPO

## 2012-11-23 DIAGNOSIS — R5383 Other fatigue: Secondary | ICD-10-CM

## 2012-11-23 DIAGNOSIS — R5381 Other malaise: Secondary | ICD-10-CM

## 2012-11-23 DIAGNOSIS — E785 Hyperlipidemia, unspecified: Secondary | ICD-10-CM

## 2012-11-23 DIAGNOSIS — E559 Vitamin D deficiency, unspecified: Secondary | ICD-10-CM

## 2012-11-23 DIAGNOSIS — I10 Essential (primary) hypertension: Secondary | ICD-10-CM

## 2012-11-23 DIAGNOSIS — R7309 Other abnormal glucose: Secondary | ICD-10-CM

## 2012-11-23 LAB — POCT GLYCOSYLATED HEMOGLOBIN (HGB A1C): Hemoglobin A1C: 5.5

## 2012-11-23 LAB — THYROID PANEL WITH TSH
Free Thyroxine Index: 1.5 (ref 1.0–3.9)
T3 Uptake: 39.9 % — ABNORMAL HIGH (ref 22.5–37.0)
T4, Total: 3.8 ug/dL — ABNORMAL LOW (ref 5.0–12.5)
TSH: 1.929 u[IU]/mL (ref 0.350–4.500)

## 2012-11-23 LAB — POCT CBC
Granulocyte percent: 54.3 %G (ref 37–80)
HCT, POC: 45.3 % (ref 43.5–53.7)
Hemoglobin: 15.1 g/dL (ref 14.1–18.1)
Lymph, poc: 1.5 (ref 0.6–3.4)
MCH, POC: 31.9 pg — AB (ref 27–31.2)
MCHC: 33.4 g/dL (ref 31.8–35.4)
MCV: 95.5 fL (ref 80–97)
MPV: 9.3 fL (ref 0–99.8)
POC Granulocyte: 1.9 — AB (ref 2–6.9)
POC LYMPH PERCENT: 42.8 %L (ref 10–50)
Platelet Count, POC: 130 10*3/uL — AB (ref 142–424)
RBC: 4.7 M/uL (ref 4.69–6.13)
RDW, POC: 12.4 %
WBC: 3.5 10*3/uL — AB (ref 4.6–10.2)

## 2012-11-23 LAB — BASIC METABOLIC PANEL
BUN: 17 mg/dL (ref 6–23)
CO2: 29 mEq/L (ref 19–32)
Calcium: 9.6 mg/dL (ref 8.4–10.5)
Chloride: 106 mEq/L (ref 96–112)
Creat: 1.08 mg/dL (ref 0.50–1.35)
Glucose, Bld: 113 mg/dL — ABNORMAL HIGH (ref 70–99)
Potassium: 5.1 mEq/L (ref 3.5–5.3)
Sodium: 140 mEq/L (ref 135–145)

## 2012-11-23 LAB — HEPATIC FUNCTION PANEL
ALT: 30 U/L (ref 0–53)
AST: 28 U/L (ref 0–37)
Albumin: 4.1 g/dL (ref 3.5–5.2)
Alkaline Phosphatase: 52 U/L (ref 39–117)
Bilirubin, Direct: 0.2 mg/dL (ref 0.0–0.3)
Indirect Bilirubin: 0.8 mg/dL (ref 0.0–0.9)
Total Bilirubin: 1 mg/dL (ref 0.3–1.2)
Total Protein: 6.6 g/dL (ref 6.0–8.3)

## 2012-11-23 LAB — VITAMIN D 25 HYDROXY (VIT D DEFICIENCY, FRACTURES): Vit D, 25-Hydroxy: 41 ng/mL (ref 30–89)

## 2012-11-23 NOTE — Progress Notes (Signed)
Patient came in for labs only.

## 2012-11-24 LAB — NMR LIPOPROFILE WITH LIPIDS
Cholesterol, Total: 138 mg/dL (ref <200)
HDL Particle Number: 33.8 umol/L (ref >=30.5)
HDL Size: 9.4 nm (ref >=9.2)
HDL-C: 44 mg/dL (ref >=40)
LDL (calc): 66 mg/dL (ref <100)
LDL Particle Number: 854 nmol/L (ref <1000)
LDL Size: 20.1 nm — ABNORMAL LOW (ref >20.5)
LP-IR Score: 51 — ABNORMAL HIGH (ref <=45)
Large HDL-P: 8 umol/L (ref >=4.8)
Large VLDL-P: 6 nmol/L — ABNORMAL HIGH (ref <=2.7)
Small LDL Particle Number: 548 nmol/L — ABNORMAL HIGH (ref <=527)
Triglycerides: 140 mg/dL (ref <150)
VLDL Size: 51.3 nm — ABNORMAL HIGH (ref <=46.6)

## 2012-11-25 ENCOUNTER — Encounter: Payer: Self-pay | Admitting: Family Medicine

## 2012-11-25 ENCOUNTER — Ambulatory Visit (INDEPENDENT_AMBULATORY_CARE_PROVIDER_SITE_OTHER): Payer: BC Managed Care – PPO | Admitting: Family Medicine

## 2012-11-25 VITALS — BP 122/76 | HR 66 | Temp 97.3°F | Ht 72.0 in | Wt 201.6 lb

## 2012-11-25 DIAGNOSIS — E785 Hyperlipidemia, unspecified: Secondary | ICD-10-CM

## 2012-11-25 DIAGNOSIS — E349 Endocrine disorder, unspecified: Secondary | ICD-10-CM | POA: Insufficient documentation

## 2012-11-25 DIAGNOSIS — E291 Testicular hypofunction: Secondary | ICD-10-CM

## 2012-11-25 NOTE — Progress Notes (Signed)
  Subjective:    Patient ID: [name], male    DOB: [birth year], 62 y.o.   MRN: [id number]  HPI Patient is doing well no particular complaints as noted and review of systems. Labs reviewed with patient.  Review of Systems  Constitutional: Negative.   HENT: Negative.   Eyes: Negative.   Respiratory: Negative.   Cardiovascular: Negative.   Gastrointestinal: Negative.   Endocrine: Negative.   Genitourinary: Negative.   Musculoskeletal: Negative.   Skin: Negative.   Allergic/Immunologic: Negative.   Neurological: Negative.   Hematological: Negative.   Psychiatric/Behavioral: Negative.        Objective:   Physical Exam BP 122/76  Pulse 66  Temp(Src) 97.3 F (36.3 C) (Oral)  Ht 6' (1.829 m)  Wt 201 lb 9.6 oz (91.445 kg)  BMI 27.34 kg/m2  The patient appeared well nourished and normally developed, alert and oriented to time and place. Speech, behavior and judgement appear normal. Vital signs as documented.  Head exam is unremarkable. No scleral icterus or pallor noted.  Neck is without jugular venous distension, thyromegally, or carotid bruits. Carotid upstrokes are brisk bilaterally. No cervical adenopathy. Lungs are clear anteriorly and posteriorly to auscultation. Normal respiratory effort. Cardiac exam reveals regular rate and rhythm. First and second heart sounds normal. No murmurs, rubs or gallops.  Abdominal exam reveals normal bowl sounds, no masses, no organomegaly and no aortic enlargement. No inguinal adenopathy. Extremities are nonedematous and both femoral and pedal pulses are normal. Skin without pallor or jaundice.  Warm and dry, without rash. Neurologic exam reveals normal deep tendon reflexes and normal sensation.          Assessment & Plan:  Testosterone deficiency  Hyperlipidemia   Patient Instructions  Take vitamin D 3 2000 daily Monday through Friday and take 4000 on Saturday and Sunday Now Brand Continue current meds and aggressive  therapeutic lifestyle changes Watch the carbs in your diet more closely

## 2012-11-25 NOTE — Patient Instructions (Addendum)
Take vitamin D 3 2000 daily Monday through Friday and take 4000 on Saturday and Sunday Now Brand Continue current meds and aggressive therapeutic lifestyle changes Watch the carbs in your diet more closely

## 2012-11-30 ENCOUNTER — Encounter: Payer: Self-pay | Admitting: *Deleted

## 2012-12-10 ENCOUNTER — Ambulatory Visit (INDEPENDENT_AMBULATORY_CARE_PROVIDER_SITE_OTHER): Payer: BC Managed Care – PPO | Admitting: General Practice

## 2012-12-10 ENCOUNTER — Encounter: Payer: Self-pay | Admitting: General Practice

## 2012-12-10 ENCOUNTER — Telehealth: Payer: Self-pay | Admitting: Family Medicine

## 2012-12-10 VITALS — BP 118/76 | HR 60 | Temp 97.6°F | Ht 72.0 in | Wt 202.0 lb

## 2012-12-10 DIAGNOSIS — J069 Acute upper respiratory infection, unspecified: Secondary | ICD-10-CM

## 2012-12-10 MED ORDER — AZITHROMYCIN 250 MG PO TABS: ORAL_TABLET | ORAL | Status: DC

## 2012-12-10 NOTE — Progress Notes (Signed)
  Subjective:    Patient ID: [name], male    DOB: 06/24/1951, 62 y.o.   MRN: [id number]  HPI Presents today with productive cough (brown sputum), congestion, and generalized weakness. OTC mucinex with minimal relief.     Review of Systems  Constitutional: Negative for fever and chills.  HENT: Positive for congestion.   Respiratory: Positive for cough. Negative for chest tightness and shortness of breath.   Cardiovascular: Negative for chest pain.  Genitourinary: Negative for difficulty urinating.  Skin: Negative.   Neurological: Negative for dizziness and headaches.       Objective:   Physical Exam  Constitutional: He is oriented to person, place, and time. He appears well-developed and well-nourished.  Cardiovascular: Normal rate, regular rhythm and normal heart sounds.   Pulmonary/Chest: Effort normal and breath sounds normal. No respiratory distress. He exhibits no tenderness.  Neurological: He is alert and oriented to person, place, and time.  Skin: Skin is warm and dry.  Psychiatric: He has a normal mood and affect.          Assessment & Plan:  Continue antibiotics even if feeling better Increase fluid intake Motrin or tylenol OTC OTC decongestant New toothbrush in 3 days Proper handwashing Patient verbalized understanding Coralie Keens, FNP-C

## 2012-12-10 NOTE — Telephone Encounter (Signed)
Returned Call to triage. Appt given

## 2012-12-10 NOTE — Patient Instructions (Signed)

## 2013-03-08 ENCOUNTER — Other Ambulatory Visit: Payer: Self-pay | Admitting: Family Medicine

## 2013-03-09 ENCOUNTER — Other Ambulatory Visit: Payer: Self-pay | Admitting: *Deleted

## 2013-03-09 MED ORDER — TESTOSTERONE 20.25 MG/ACT (1.62%) TD GEL: 3.0000 | Freq: Every day | TRANSDERMAL | Status: DC

## 2013-03-09 NOTE — Telephone Encounter (Signed)
Last seen in office on 12-10-12. Please advise. If approved please print and route to Pool A so pt can be called to pick up

## 2013-03-09 NOTE — Telephone Encounter (Signed)
Pharmacy sent request for simcor but the dosage is different that what we have listed. Please advise which dose he should be taking

## 2013-03-30 ENCOUNTER — Other Ambulatory Visit (INDEPENDENT_AMBULATORY_CARE_PROVIDER_SITE_OTHER): Payer: BC Managed Care – PPO

## 2013-03-30 DIAGNOSIS — E291 Testicular hypofunction: Secondary | ICD-10-CM

## 2013-03-30 DIAGNOSIS — I10 Essential (primary) hypertension: Secondary | ICD-10-CM

## 2013-03-30 DIAGNOSIS — E785 Hyperlipidemia, unspecified: Secondary | ICD-10-CM

## 2013-03-30 DIAGNOSIS — E559 Vitamin D deficiency, unspecified: Secondary | ICD-10-CM

## 2013-03-30 LAB — POCT CBC
Granulocyte percent: 52.4 %G (ref 37–80)
HCT, POC: 44.2 % (ref 43.5–53.7)
Hemoglobin: 15.1 g/dL (ref 14.1–18.1)
Lymph, poc: 1.2 (ref 0.6–3.4)
MCH, POC: 32.5 pg — AB (ref 27–31.2)
MCHC: 34.2 g/dL (ref 31.8–35.4)
MCV: 95 fL (ref 80–97)
MPV: 8.4 fL (ref 0–99.8)
POC Granulocyte: 1.6 — AB (ref 2–6.9)
POC LYMPH PERCENT: 39.6 %L (ref 10–50)
Platelet Count, POC: 122 10*3/uL — AB (ref 142–424)
RBC: 4.7 M/uL (ref 4.69–6.13)
RDW, POC: 12.3 %
WBC: 3 10*3/uL — AB (ref 4.6–10.2)

## 2013-03-30 NOTE — Progress Notes (Signed)
Pt came in for labs only 

## 2013-04-01 ENCOUNTER — Encounter: Payer: Self-pay | Admitting: Family Medicine

## 2013-04-01 ENCOUNTER — Ambulatory Visit (INDEPENDENT_AMBULATORY_CARE_PROVIDER_SITE_OTHER): Payer: BC Managed Care – PPO | Admitting: Family Medicine

## 2013-04-01 VITALS — BP 113/73 | HR 56 | Temp 98.5°F | Resp 18 | Ht 73.0 in | Wt 191.0 lb

## 2013-04-01 DIAGNOSIS — E559 Vitamin D deficiency, unspecified: Secondary | ICD-10-CM

## 2013-04-01 DIAGNOSIS — E785 Hyperlipidemia, unspecified: Secondary | ICD-10-CM

## 2013-04-01 DIAGNOSIS — E291 Testicular hypofunction: Secondary | ICD-10-CM

## 2013-04-01 DIAGNOSIS — E349 Endocrine disorder, unspecified: Secondary | ICD-10-CM

## 2013-04-01 LAB — NMR, LIPOPROFILE
Cholesterol: 150 mg/dL (ref <200)
HDL Cholesterol by NMR: 47 mg/dL (ref >=40)
HDL Particle Number: 31.3 umol/L (ref >=30.5)
LDL Particle Number: 1006 nmol/L — ABNORMAL HIGH (ref <1000)
LDL Size: 20.9 nm (ref >20.5)
LDLC SERPL CALC-MCNC: 80 mg/dL (ref <100)
LP-IR Score: 36 (ref <=45)
Small LDL Particle Number: 410 nmol/L (ref <=527)
Triglycerides by NMR: 114 mg/dL (ref <150)

## 2013-04-01 LAB — BMP8+EGFR
BUN/Creatinine Ratio: 12 (ref 10–22)
BUN: 13 mg/dL (ref 8–27)
CO2: 26 mmol/L (ref 18–29)
Calcium: 9.3 mg/dL (ref 8.6–10.2)
Chloride: 103 mmol/L (ref 97–108)
Creatinine, Ser: 1.11 mg/dL (ref 0.76–1.27)
GFR calc Af Amer: 82 mL/min/{1.73_m2} (ref >59)
GFR calc non Af Amer: 71 mL/min/{1.73_m2} (ref >59)
Glucose: 109 mg/dL — ABNORMAL HIGH (ref 65–99)
Potassium: 4.7 mmol/L (ref 3.5–5.2)
Sodium: 141 mmol/L (ref 134–144)

## 2013-04-01 LAB — HEPATIC FUNCTION PANEL
ALT: 30 IU/L (ref 0–44)
AST: 32 IU/L (ref 0–40)
Albumin: 4.3 g/dL (ref 3.6–4.8)
Alkaline Phosphatase: 56 IU/L (ref 39–117)
Bilirubin, Direct: 0.24 mg/dL (ref 0.00–0.40)
Total Bilirubin: 1.1 mg/dL (ref 0.0–1.2)
Total Protein: 6.7 g/dL (ref 6.0–8.5)

## 2013-04-01 LAB — TESTOSTERONE,FREE AND TOTAL
Testosterone, Free: 2.8 pg/mL — ABNORMAL LOW (ref 6.6–18.1)
Testosterone: 302 ng/dL — ABNORMAL LOW (ref 348–1197)

## 2013-04-01 LAB — VITAMIN D 25 HYDROXY (VIT D DEFICIENCY, FRACTURES): Vit D, 25-Hydroxy: 46.9 ng/mL (ref 30.0–100.0)

## 2013-04-01 NOTE — Patient Instructions (Signed)
Continue meds as doing Continue aggressive therapeutic lifestyle changes Remember to get flu shot in October

## 2013-04-01 NOTE — Progress Notes (Signed)
  Subjective:    Patient ID: [name], male    DOB: 1951/04/30, 62 y.o.   MRN: [id number]  HPI Patient returns to clinic for followup of chronic medical problems. He is doing well without any complaints. These problems include hyperlipidemia, BPH, and testosterone deficiency.    Review of Systems  Constitutional: Negative.  Negative for activity change, appetite change and fatigue.  HENT: Negative.  Negative for ear pain, congestion, sore throat, sneezing and postnasal drip.   Eyes: Negative.  Negative for pain, discharge, redness, itching and visual disturbance.  Respiratory: Negative.  Negative for cough, choking, chest tightness, shortness of breath and wheezing.   Cardiovascular: Negative.  Negative for palpitations and leg swelling.  Gastrointestinal: Negative.  Negative for vomiting, abdominal pain, diarrhea, constipation and blood in stool.  Endocrine: Negative.  Negative for cold intolerance, heat intolerance, polydipsia and polyuria.  Genitourinary: Negative for dysuria, urgency, frequency and hematuria.  Musculoskeletal: Positive for myalgias (muscle soreness due to weightlifting). Negative for back pain and arthralgias.  Skin: Negative.  Negative for color change, rash and wound.  Allergic/Immunologic: Negative.   Neurological: Negative for dizziness, tremors, weakness, light-headedness, numbness and headaches.  Hematological: Does not bruise/bleed easily.  Psychiatric/Behavioral: Negative for confusion, sleep disturbance and agitation. The patient is not nervous/anxious.        Objective:   Physical Exam BP 113/73  Pulse 56  Temp(Src) 98.5 F (36.9 C) (Oral)  Resp 18  Ht 6\' 1"  (1.854 m)  Wt 191 lb (86.637 kg)  BMI 25.2 kg/m2  The patient appeared well nourished and normally developed, alert and oriented to time and place. Positive demeanor.  Speech, behavior and judgement appear normal. Vital signs as documented.  Head exam is unremarkable. No scleral icterus  or pallor noted. Ears nose and throat were all normal.  Neck is without jugular venous distension, thyromegally, or carotid bruits. Carotid upstrokes are brisk bilaterally. No cervical adenopathy. Lungs are clear anteriorly and posteriorly to auscultation. Normal respiratory effort. Cardiac exam reveals regular rate and rhythm at 72 per minute.. First and second heart sounds normal.  No murmurs, rubs or gallops.  Abdominal exam reveals normal bowl sounds, no masses, no organomegaly and no aortic enlargement. No inguinal adenopathy. There is no abdominal tenderness. Extremities are nonedematous and both femoral and pedal pulses are normal. Skin without pallor or jaundice.  Warm and dry, without rash. Neurologic exam reveals normal deep tendon reflexes and normal sensation.  Recent labs were reviewed with patient. The testosterone levels were low this time. Cholesterol numbers were elevated this time, but only slightly          Assessment & Plan:  1. Hyperlipemia  2. Vitamin D deficiency  3. Testosterone deficiency  Patient Instructions  Continue meds as doing Continue aggressive therapeutic lifestyle changes Remember to get flu shot in October   Repeat labs and prostate exam and PSA and testosterone levels at next visit Nyra Capes MD

## 2013-04-30 ENCOUNTER — Other Ambulatory Visit: Payer: Self-pay | Admitting: Family Medicine

## 2013-04-30 NOTE — Telephone Encounter (Signed)
This is okay for 6 months 

## 2013-04-30 NOTE — Telephone Encounter (Signed)
Last seen 04/01/13  DWM  If approved print and route to nurse

## 2013-05-03 NOTE — Telephone Encounter (Signed)
Pt aware to pick up in office

## 2013-05-04 ENCOUNTER — Other Ambulatory Visit: Payer: Self-pay | Admitting: Family Medicine

## 2013-05-10 ENCOUNTER — Other Ambulatory Visit: Payer: Self-pay | Admitting: Family Medicine

## 2013-06-10 ENCOUNTER — Other Ambulatory Visit: Payer: Self-pay

## 2013-06-16 ENCOUNTER — Other Ambulatory Visit: Payer: Self-pay | Admitting: Family Medicine

## 2013-06-17 ENCOUNTER — Encounter: Payer: Self-pay | Admitting: *Deleted

## 2013-06-17 NOTE — Telephone Encounter (Signed)
Left message that prescription is available

## 2013-06-17 NOTE — Telephone Encounter (Signed)
This prescription is okay for 3 month Make sure that the patient knows his last testosterone level

## 2013-06-17 NOTE — Telephone Encounter (Signed)
LAST 0V 04/01/13. LAST TESTOSTERONE LEVEL WAS 302. PRINT AND CALL PT FOR RESULTS.

## 2013-07-14 ENCOUNTER — Other Ambulatory Visit: Payer: Self-pay | Admitting: Family Medicine

## 2013-07-16 ENCOUNTER — Encounter: Payer: Self-pay | Admitting: Internal Medicine

## 2013-07-16 ENCOUNTER — Other Ambulatory Visit: Payer: Self-pay | Admitting: Family Medicine

## 2013-07-16 NOTE — Telephone Encounter (Signed)
rx called in

## 2013-07-16 NOTE — Telephone Encounter (Signed)
Last seen 04/01/13  DWM  If approved route to nurse to call into [facility]

## 2013-07-16 NOTE — Telephone Encounter (Signed)
NTBS- appointment with Dr. Christell Constant

## 2013-08-24 ENCOUNTER — Telehealth: Payer: Self-pay | Admitting: Family Medicine

## 2013-08-24 DIAGNOSIS — Z96649 Presence of unspecified artificial hip joint: Secondary | ICD-10-CM

## 2013-08-24 DIAGNOSIS — E349 Endocrine disorder, unspecified: Secondary | ICD-10-CM

## 2013-08-24 DIAGNOSIS — E785 Hyperlipidemia, unspecified: Secondary | ICD-10-CM

## 2013-08-25 ENCOUNTER — Other Ambulatory Visit: Payer: Self-pay | Admitting: *Deleted

## 2013-08-25 MED ORDER — TESTOSTERONE 20.25 MG/ACT (1.62%) TD GEL: TRANSDERMAL | Status: DC

## 2013-08-25 NOTE — Telephone Encounter (Signed)
Labs ordered.

## 2013-09-03 ENCOUNTER — Other Ambulatory Visit (INDEPENDENT_AMBULATORY_CARE_PROVIDER_SITE_OTHER): Payer: BC Managed Care – PPO

## 2013-09-03 DIAGNOSIS — E349 Endocrine disorder, unspecified: Secondary | ICD-10-CM

## 2013-09-03 DIAGNOSIS — Z96649 Presence of unspecified artificial hip joint: Secondary | ICD-10-CM

## 2013-09-03 DIAGNOSIS — E785 Hyperlipidemia, unspecified: Secondary | ICD-10-CM

## 2013-09-03 NOTE — Progress Notes (Signed)
Patient came in for labs only.

## 2013-09-06 LAB — NMR, LIPOPROFILE
Cholesterol: 157 mg/dL (ref <200)
HDL Cholesterol by NMR: 50 mg/dL (ref >=40)
HDL Particle Number: 29.2 umol/L — ABNORMAL LOW (ref >=30.5)
LDL Particle Number: 976 nmol/L (ref <1000)
LDL Size: 20.8 nm (ref >20.5)
LDLC SERPL CALC-MCNC: 86 mg/dL (ref <100)
LP-IR Score: 26 (ref <=45)
Small LDL Particle Number: 481 nmol/L (ref <=527)
Triglycerides by NMR: 104 mg/dL (ref <150)

## 2013-09-06 LAB — CBC WITH DIFFERENTIAL
Basophils Absolute: 0 10*3/uL (ref 0.0–0.2)
Basos: 1 %
Eos: 3 %
Eosinophils Absolute: 0.1 10*3/uL (ref 0.0–0.4)
HCT: 45.4 % (ref 37.5–51.0)
Hemoglobin: 15.5 g/dL (ref 12.6–17.7)
Immature Grans (Abs): 0 10*3/uL (ref 0.0–0.1)
Immature Granulocytes: 0 %
Lymphocytes Absolute: 1.3 10*3/uL (ref 0.7–3.1)
Lymphs: 35 %
MCH: 32.5 pg (ref 26.6–33.0)
MCHC: 34.1 g/dL (ref 31.5–35.7)
MCV: 95 fL (ref 79–97)
Monocytes Absolute: 0.3 10*3/uL (ref 0.1–0.9)
Monocytes: 9 %
Neutrophils Absolute: 2 10*3/uL (ref 1.4–7.0)
Neutrophils Relative %: 52 %
Platelets: 162 10*3/uL (ref 150–379)
RBC: 4.77 x10E6/uL (ref 4.14–5.80)
RDW: 13.2 % (ref 12.3–15.4)
WBC: 3.8 10*3/uL (ref 3.4–10.8)

## 2013-09-06 LAB — TESTOSTERONE,FREE AND TOTAL
Testosterone, Free: 6 pg/mL — ABNORMAL LOW (ref 6.6–18.1)
Testosterone: 262 ng/dL — ABNORMAL LOW (ref 348–1197)

## 2013-09-06 LAB — HEPATIC FUNCTION PANEL
ALT: 25 IU/L (ref 0–44)
AST: 32 IU/L (ref 0–40)
Albumin: 4.5 g/dL (ref 3.6–4.8)
Alkaline Phosphatase: 53 IU/L (ref 39–117)
Bilirubin, Direct: 0.23 mg/dL (ref 0.00–0.40)
Total Bilirubin: 1 mg/dL (ref 0.0–1.2)
Total Protein: 6.6 g/dL (ref 6.0–8.5)

## 2013-09-06 LAB — BMP8+EGFR
BUN/Creatinine Ratio: 17 (ref 10–22)
BUN: 20 mg/dL (ref 8–27)
CO2: 25 mmol/L (ref 18–29)
Calcium: 9.9 mg/dL (ref 8.6–10.2)
Chloride: 102 mmol/L (ref 97–108)
Creatinine, Ser: 1.18 mg/dL (ref 0.76–1.27)
GFR calc Af Amer: 76 mL/min/{1.73_m2} (ref >59)
GFR calc non Af Amer: 66 mL/min/{1.73_m2} (ref >59)
Glucose: 111 mg/dL — ABNORMAL HIGH (ref 65–99)
Potassium: 4.6 mmol/L (ref 3.5–5.2)
Sodium: 143 mmol/L (ref 134–144)

## 2013-09-06 LAB — PSA, TOTAL AND FREE
PSA, Free Pct: 20.8 %
PSA, Free: 0.25 ng/mL
PSA: 1.2 ng/mL (ref 0.0–4.0)

## 2013-09-06 LAB — VITAMIN D 25 HYDROXY (VIT D DEFICIENCY, FRACTURES): Vit D, 25-Hydroxy: 43.7 ng/mL (ref 30.0–100.0)

## 2013-09-16 ENCOUNTER — Ambulatory Visit (INDEPENDENT_AMBULATORY_CARE_PROVIDER_SITE_OTHER): Payer: BC Managed Care – PPO | Admitting: Family Medicine

## 2013-09-16 ENCOUNTER — Encounter: Payer: Self-pay | Admitting: Family Medicine

## 2013-09-16 VITALS — BP 123/74 | HR 60 | Temp 96.6°F | Ht 73.0 in | Wt 199.0 lb

## 2013-09-16 DIAGNOSIS — E349 Endocrine disorder, unspecified: Secondary | ICD-10-CM

## 2013-09-16 DIAGNOSIS — R7309 Other abnormal glucose: Secondary | ICD-10-CM

## 2013-09-16 DIAGNOSIS — N400 Enlarged prostate without lower urinary tract symptoms: Secondary | ICD-10-CM

## 2013-09-16 DIAGNOSIS — E291 Testicular hypofunction: Secondary | ICD-10-CM

## 2013-09-16 DIAGNOSIS — R739 Hyperglycemia, unspecified: Secondary | ICD-10-CM

## 2013-09-16 DIAGNOSIS — E785 Hyperlipidemia, unspecified: Secondary | ICD-10-CM

## 2013-09-16 DIAGNOSIS — M542 Cervicalgia: Secondary | ICD-10-CM

## 2013-09-16 LAB — POCT GLYCOSYLATED HEMOGLOBIN (HGB A1C): Hemoglobin A1C: 5.1

## 2013-09-16 NOTE — Addendum Note (Signed)
Addended by: Zannie Cove on: 09/16/2013 04:24 PM   Modules accepted: Orders

## 2013-09-16 NOTE — Patient Instructions (Addendum)
Continue current medications. Continue good therapeutic lifestyle changes which include good diet and exercise. Fall precautions discussed with patient. Schedule your flu vaccine if you haven't had it yet If you are over 63 years old - you may need Prevnar 68 or the adult Pneumonia vaccine. Please check with her insurance regarding the Prevnar vaccine We will call you with the results of the hemoglobin A1c once those results are available. You are due to get a chest x-ray today and we will call you with those results once they are available also.

## 2013-09-16 NOTE — Progress Notes (Signed)
Subjective:    Patient ID: [name], male    DOB: 04-09-1951, 63 y.o.   MRN: [id number]  HPI Pt here for follow up and management of chronic medical problems. Recent labs were reviewed with patient and has an understanding of disease. He'll have a hemoglobin A1c which did not get added to the most recent lab work. He is going to check with his insurance regarding the Prevnar vaccine.       Patient Active Problem List   Diagnosis Date Noted  . Testosterone deficiency 11/25/2012  . 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 Hyperlipidemia 09/25/2011   Outpatient Encounter Prescriptions as of 09/16/2013  Medication Sig  . aspirin 81 MG tablet Take 81 mg by mouth daily.  . cholecalciferol (VITAMIN D) 1000 UNITS tablet Take 3,000 Units by mouth every morning.   . fish oil-omega-3 fatty acids 1000 MG capsule Take 2 g by mouth 2 (two) times daily.   Marland Kitchen SIMCOR 500-20 MG 24 hr tablet TAKE 1 TABLET DAILY  . Testosterone (ANDROGEL PUMP) 20.25 MG/ACT (1.62%) GEL APPLY 3 PUMPS ONCE DAILY AS DIRECTED    Review of Systems  Constitutional: Negative.   HENT: Negative.   Eyes: Negative.   Respiratory: Negative.   Cardiovascular: Negative.   Gastrointestinal: Negative.   Endocrine: Negative.   Genitourinary: Negative.   Musculoskeletal: Positive for myalgias (right shoulder pain) and neck pain.  Skin: Negative.   Allergic/Immunologic: Negative.   Neurological: Negative.   Hematological: Negative.   Psychiatric/Behavioral: Negative.        Objective:   Physical Exam  Nursing note and vitals reviewed. Constitutional: He is oriented to person, place, and time. He appears well-developed and well-nourished. No distress.  HENT:  Head: Normocephalic and atraumatic.  Right Ear: External ear normal.  Left Ear: External ear normal.  Nose: Nose normal.  Mouth/Throat: Oropharynx is clear and moist. No oropharyngeal exudate.  Eyes: Conjunctivae and EOM are normal. Pupils are equal, round, and  reactive to light. Right eye exhibits no discharge. Left eye exhibits no discharge. No scleral icterus.  Neck: Normal range of motion. Neck supple. No thyromegaly present.  Cardiovascular: Normal rate, regular rhythm, normal heart sounds and intact distal pulses.  Exam reveals no gallop and no friction rub.   No murmur heard. Pulmonary/Chest: Effort normal and breath sounds normal. No respiratory distress. He has no wheezes. He has no rales. He exhibits no tenderness.  Abdominal: Soft. Bowel sounds are normal. He exhibits no mass. There is no tenderness. There is no rebound and no guarding.  Genitourinary: Rectum normal and penis normal.  The prostate is slightly enlarged with no lumps or masses. No rectal masses. No inguinal hernia. External genitalia are normal  Musculoskeletal: Normal range of motion. He exhibits no edema and no tenderness.  Good mobility of the neck without pain  Lymphadenopathy:    He has no cervical adenopathy.  Neurological: He is alert and oriented to person, place, and time. He has normal reflexes. No cranial nerve deficit.  Skin: Skin is warm and dry. No rash noted. No erythema. No pallor.  Psychiatric: He has a normal mood and affect. His behavior is normal. Judgment and thought content normal.   BP 123/74  Pulse 60  Temp(Src) 96.6 F (35.9 C) (Oral)  Ht 6\' 1"  (1.854 m)  Wt 199 lb (90.266 kg)  BMI 26.26 kg/m2  [facility] reading (PRIMARY) by  Dr.Moore-chest x-ray  Assessment & Plan:  1. Testosterone deficiency  2. Hyperlipidemia  3. BPH (benign prostatic hyperplasia)  4. Neck pain Patient Instructions  Continue current medications. Continue good therapeutic lifestyle changes which include good diet and exercise. Fall precautions discussed with patient. Schedule your flu vaccine if you haven't had it yet If you are over 28 years old - you may need Prevnar 33 or the adult Pneumonia vaccine. Please check with her  insurance regarding the Prevnar vaccine We will call you with the results of the hemoglobin A1c once those results are available. You are due to get a chest x-ray today and we will call you with those results once they are available also.   Arrie Senate MD

## 2013-10-12 ENCOUNTER — Other Ambulatory Visit: Payer: Self-pay | Admitting: Nurse Practitioner

## 2013-10-12 ENCOUNTER — Ambulatory Visit (INDEPENDENT_AMBULATORY_CARE_PROVIDER_SITE_OTHER): Payer: BC Managed Care – PPO | Admitting: Pharmacist Clinician (PhC)/ Clinical Pharmacy Specialist

## 2013-10-12 DIAGNOSIS — E785 Hyperlipidemia, unspecified: Secondary | ICD-10-CM

## 2013-10-12 MED ORDER — SIMVASTATIN 20 MG PO TABS: 20.0000 mg | ORAL_TABLET | Freq: Every day | ORAL | Status: DC

## 2013-10-14 ENCOUNTER — Other Ambulatory Visit: Payer: Self-pay | Admitting: *Deleted

## 2013-10-14 MED ORDER — TESTOSTERONE 20.25 MG/ACT (1.62%) TD GEL: TRANSDERMAL | Status: DC

## 2013-10-14 NOTE — Telephone Encounter (Signed)
Pt aware to pick up

## 2013-10-14 NOTE — Telephone Encounter (Signed)
Patient last seen in office on 09-16-13. Rx last filled on 08-25-13. Please advise. If approved please route to Pool A so nurse can call patient to pick up

## 2013-11-09 ENCOUNTER — Other Ambulatory Visit: Payer: Self-pay | Admitting: *Deleted

## 2013-11-09 ENCOUNTER — Encounter (INDEPENDENT_AMBULATORY_CARE_PROVIDER_SITE_OTHER): Payer: BC Managed Care – PPO

## 2013-11-09 MED ORDER — AZITHROMYCIN 250 MG PO TABS: ORAL_TABLET | ORAL | Status: DC

## 2013-11-26 ENCOUNTER — Encounter: Payer: Self-pay | Admitting: *Deleted

## 2014-01-20 ENCOUNTER — Encounter: Payer: Self-pay | Admitting: *Deleted

## 2014-02-02 ENCOUNTER — Other Ambulatory Visit (HOSPITAL_COMMUNITY): Payer: Self-pay | Admitting: Orthopedic Surgery

## 2014-02-02 DIAGNOSIS — M79652 Pain in left thigh: Secondary | ICD-10-CM

## 2014-02-14 ENCOUNTER — Ambulatory Visit (HOSPITAL_COMMUNITY)
Admission: RE | Admit: 2014-02-14 | Discharge: 2014-02-14 | Disposition: A | Discharge status: Home / Self Care | Payer: BC Managed Care – PPO | Source: Ambulatory Visit | Attending: Orthopedic Surgery | Admitting: Orthopedic Surgery

## 2014-02-14 ENCOUNTER — Encounter (HOSPITAL_COMMUNITY)
Admission: RE | Admit: 2014-02-14 | Discharge: 2014-02-14 | Disposition: A | Discharge status: Home / Self Care | Payer: BC Managed Care – PPO | Source: Ambulatory Visit | Attending: Orthopedic Surgery | Admitting: Orthopedic Surgery

## 2014-02-14 DIAGNOSIS — M79652 Pain in left thigh: Secondary | ICD-10-CM

## 2014-02-14 DIAGNOSIS — Z96649 Presence of unspecified artificial hip joint: Secondary | ICD-10-CM | POA: Insufficient documentation

## 2014-02-14 DIAGNOSIS — M25559 Pain in unspecified hip: Secondary | ICD-10-CM | POA: Insufficient documentation

## 2014-02-14 MED ORDER — TECHNETIUM TC 99M MEDRONATE IV KIT
25.5000 | PACK | Freq: Once | INTRAVENOUS | Status: AC | PRN
Start: 2014-02-14 — End: 2014-02-14
  Administered 2014-02-14: 25.5 via INTRAVENOUS

## 2014-02-15 ENCOUNTER — Other Ambulatory Visit: Payer: Self-pay | Admitting: Family Medicine

## 2014-02-15 ENCOUNTER — Telehealth: Payer: Self-pay | Admitting: Family Medicine

## 2014-02-15 MED ORDER — AZITHROMYCIN 250 MG PO TABS: ORAL_TABLET | ORAL | Status: DC

## 2014-02-15 NOTE — Telephone Encounter (Signed)
Patient wants to know if he can get a rx for zpack for a cough, and congestion he is going out of town and wants to know if we can call in a rx

## 2014-02-16 ENCOUNTER — Encounter: Payer: Self-pay | Admitting: Family Medicine

## 2014-02-16 ENCOUNTER — Encounter (INDEPENDENT_AMBULATORY_CARE_PROVIDER_SITE_OTHER): Payer: BC Managed Care – PPO | Admitting: Family Medicine

## 2014-02-16 NOTE — Telephone Encounter (Signed)
appt made

## 2014-02-16 NOTE — Progress Notes (Signed)
Pt came in for URI Did not realize [facility] had called Zpac in on Tuesday Wanted to try this first

## 2014-03-07 ENCOUNTER — Other Ambulatory Visit (INDEPENDENT_AMBULATORY_CARE_PROVIDER_SITE_OTHER): Payer: BC Managed Care – PPO

## 2014-03-07 DIAGNOSIS — E785 Hyperlipidemia, unspecified: Secondary | ICD-10-CM

## 2014-03-07 DIAGNOSIS — M542 Cervicalgia: Secondary | ICD-10-CM

## 2014-03-07 DIAGNOSIS — R5383 Other fatigue: Secondary | ICD-10-CM

## 2014-03-07 DIAGNOSIS — R5381 Other malaise: Secondary | ICD-10-CM

## 2014-03-07 DIAGNOSIS — E291 Testicular hypofunction: Secondary | ICD-10-CM

## 2014-03-07 DIAGNOSIS — E349 Endocrine disorder, unspecified: Secondary | ICD-10-CM

## 2014-03-07 DIAGNOSIS — R7309 Other abnormal glucose: Secondary | ICD-10-CM

## 2014-03-07 DIAGNOSIS — R739 Hyperglycemia, unspecified: Secondary | ICD-10-CM

## 2014-03-07 LAB — POCT GLYCOSYLATED HEMOGLOBIN (HGB A1C): Hemoglobin A1C: 5.2

## 2014-03-07 LAB — POCT CBC
Granulocyte percent: 56.8 %G (ref 37–80)
HCT, POC: 44.4 % (ref 43.5–53.7)
Hemoglobin: 14.9 g/dL (ref 14.1–18.1)
Lymph, poc: 1.4 (ref 0.6–3.4)
MCH, POC: 32 pg — AB (ref 27–31.2)
MCHC: 33.6 g/dL (ref 31.8–35.4)
MCV: 95.1 fL (ref 80–97)
MPV: 9.5 fL (ref 0–99.8)
POC Granulocyte: 2 (ref 2–6.9)
POC LYMPH PERCENT: 39.6 %L (ref 10–50)
Platelet Count, POC: 122 10*3/uL — AB (ref 142–424)
RBC: 4.7 M/uL (ref 4.69–6.13)
RDW, POC: 12.4 %
WBC: 3.5 10*3/uL — AB (ref 4.6–10.2)

## 2014-03-08 LAB — BMP8+EGFR
BUN/Creatinine Ratio: 18 (ref 10–22)
BUN: 20 mg/dL (ref 8–27)
CO2: 25 mmol/L (ref 18–29)
Calcium: 9.3 mg/dL (ref 8.6–10.2)
Chloride: 99 mmol/L (ref 97–108)
Creatinine, Ser: 1.11 mg/dL (ref 0.76–1.27)
GFR calc Af Amer: 82 mL/min/{1.73_m2} (ref >59)
GFR calc non Af Amer: 71 mL/min/{1.73_m2} (ref >59)
Glucose: 108 mg/dL — ABNORMAL HIGH (ref 65–99)
Potassium: 4.8 mmol/L (ref 3.5–5.2)
Sodium: 138 mmol/L (ref 134–144)

## 2014-03-08 LAB — HEPATIC FUNCTION PANEL
ALT: 24 IU/L (ref 0–44)
AST: 33 IU/L (ref 0–40)
Albumin: 4.3 g/dL (ref 3.6–4.8)
Alkaline Phosphatase: 51 IU/L (ref 39–117)
Bilirubin, Direct: 0.19 mg/dL (ref 0.00–0.40)
Total Bilirubin: 0.8 mg/dL (ref 0.0–1.2)
Total Protein: 6.5 g/dL (ref 6.0–8.5)

## 2014-03-08 LAB — NMR, LIPOPROFILE
Cholesterol: 137 mg/dL (ref 100–199)
HDL Cholesterol by NMR: 45 mg/dL (ref >39)
HDL Particle Number: 32.9 umol/L (ref >=30.5)
LDL Particle Number: 719 nmol/L (ref <1000)
LDL Size: 20.6 nm (ref >20.5)
LDLC SERPL CALC-MCNC: 69 mg/dL (ref 0–99)
LP-IR Score: 36 (ref <=45)
Small LDL Particle Number: 212 nmol/L (ref <=527)
Triglycerides by NMR: 115 mg/dL (ref 0–149)

## 2014-03-08 LAB — TESTOSTERONE,FREE AND TOTAL
Testosterone, Free: 21.2 pg/mL — ABNORMAL HIGH (ref 6.6–18.1)
Testosterone: 436 ng/dL (ref 348–1197)

## 2014-03-08 LAB — VITAMIN D 25 HYDROXY (VIT D DEFICIENCY, FRACTURES): Vit D, 25-Hydroxy: 39.5 ng/mL (ref 30.0–100.0)

## 2014-03-16 ENCOUNTER — Ambulatory Visit: Payer: BC Managed Care – PPO | Admitting: Family Medicine

## 2014-03-17 ENCOUNTER — Ambulatory Visit (INDEPENDENT_AMBULATORY_CARE_PROVIDER_SITE_OTHER): Payer: BC Managed Care – PPO | Admitting: Family Medicine

## 2014-03-17 ENCOUNTER — Ambulatory Visit (INDEPENDENT_AMBULATORY_CARE_PROVIDER_SITE_OTHER): Payer: BC Managed Care – PPO

## 2014-03-17 ENCOUNTER — Encounter: Payer: Self-pay | Admitting: Family Medicine

## 2014-03-17 VITALS — BP 115/78 | HR 53 | Temp 96.6°F | Ht 73.0 in | Wt 195.0 lb

## 2014-03-17 DIAGNOSIS — E785 Hyperlipidemia, unspecified: Secondary | ICD-10-CM

## 2014-03-17 DIAGNOSIS — E291 Testicular hypofunction: Secondary | ICD-10-CM

## 2014-03-17 DIAGNOSIS — E349 Endocrine disorder, unspecified: Secondary | ICD-10-CM

## 2014-03-17 LAB — POCT UA - MICROSCOPIC ONLY
Bacteria, U Microscopic: NEGATIVE
Casts, Ur, LPF, POC: NEGATIVE
Crystals, Ur, HPF, POC: NEGATIVE
Mucus, UA: NEGATIVE
RBC, urine, microscopic: NEGATIVE
WBC, Ur, HPF, POC: NEGATIVE
Yeast, UA: NEGATIVE

## 2014-03-17 LAB — POCT URINALYSIS DIPSTICK
Bilirubin, UA: NEGATIVE
Blood, UA: NEGATIVE
Glucose, UA: NEGATIVE
Ketones, UA: NEGATIVE
Leukocytes, UA: NEGATIVE
Nitrite, UA: NEGATIVE
Protein, UA: NEGATIVE
Spec Grav, UA: 1.01
Urobilinogen, UA: NEGATIVE
pH, UA: 6.5

## 2014-03-17 NOTE — Progress Notes (Addendum)
Subjective:    Patient ID: [name], male    DOB: [birth year], 63 y.o.   MRN: [id number]  HPI Pt here for follow up and management of chronic medical problems. Patient is doing well with no specific complaints other than his left hip. He has recently seen the orthopedist about this. His most recent lab work will be reviewed with him today. The blood sugar was slightly elevated at 108. The testosterone levels were good except the free direct testosterone level was slightly elevated. Liver function tests were normal the vitamin D level was 39.5 and he was asked to increase his vitamin D3 by taking 2000 daily Monday through Saturday  and 4000 on Sunday . The cholesterol numbers were excellent. The CBC was within normal limits. A hemoglobin A1c was done because of the slightly elevated blood sugar and this was well within the normal range at 5.2%. This was reviewed with him he was given a copy of the report.        Patient Active Problem List   Diagnosis Date Noted  . Testosterone deficiency 11/25/2012  . 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 Hyperlipidemia 09/25/2011   Outpatient Encounter Prescriptions as of 03/17/2014  Medication Sig  . aspirin 81 MG tablet Take 81 mg by mouth daily.  . cholecalciferol (VITAMIN D) 1000 UNITS tablet Take 3,000 Units by mouth every morning.   . fish oil-omega-3 fatty acids 1000 MG capsule Take 2 g by mouth 2 (two) times daily.   . simvastatin (ZOCOR) 20 MG tablet Take 1 tablet (20 mg total) by mouth at bedtime.  . Testosterone (ANDROGEL PUMP) 20.25 MG/ACT (1.62%) GEL APPLY 3 PUMPS ONCE DAILY AS DIRECTED  . [DISCONTINUED] azithromycin (ZITHROMAX) 250 MG tablet As dircted    Review of Systems  Constitutional: Negative.   HENT: Negative.   Eyes: Negative.   Respiratory: Negative.   Cardiovascular: Negative.   Gastrointestinal: Negative.   Endocrine: Negative.   Genitourinary: Negative.   Musculoskeletal: Positive for arthralgias (left hip pain- at  times and into leg- seen ortho.).  Skin: Negative.   Allergic/Immunologic: Negative.   Neurological: Negative.   Hematological: Negative.   Psychiatric/Behavioral: Negative.        Objective:   Physical Exam  Nursing note and vitals reviewed. Constitutional: He is oriented to person, place, and time. He appears well-developed and well-nourished. No distress.  HENT:  Head: Normocephalic and atraumatic.  Right Ear: External ear normal.  Left Ear: External ear normal.  Nose: Nose normal.  Mouth/Throat: Oropharynx is clear and moist. No oropharyngeal exudate.  Eyes: Conjunctivae and EOM are normal. Pupils are equal, round, and reactive to light. Right eye exhibits no discharge. Left eye exhibits no discharge. No scleral icterus.  Neck: Normal range of motion. Neck supple. No thyromegaly present.  No carotid bruits  Cardiovascular: Normal rate, regular rhythm, normal heart sounds and intact distal pulses.  Exam reveals no gallop and no friction rub.   No murmur heard. At 72 per minute  Pulmonary/Chest: Effort normal and breath sounds normal. No respiratory distress. He has no wheezes. He has no rales. He exhibits no tenderness.  The lungs are clear anteriorly and posteriorly, there is no axillary adenopathy  Abdominal: Soft. Bowel sounds are normal. He exhibits no mass. There is no tenderness. There is no rebound and no guarding.  There are no abdominal bruits  Genitourinary: Rectum normal and penis normal.  There is minimal enlargement of the prostate gland and it is smooth without  lumps or masses. There are no rectal masses. The external genitalia were normal. There are no inguinal hernias or inguinal nodes.  Musculoskeletal: Normal range of motion. He exhibits no edema and no tenderness.  The patient has good motion of his left hip with abduction and abduction and leg raising.  Lymphadenopathy:    He has no cervical adenopathy.  Neurological: He is alert and oriented to person,  place, and time. He has normal reflexes. No cranial nerve deficit.  Skin: Skin is warm and dry. No rash noted. No erythema. No pallor.  Psychiatric: He has a normal mood and affect. His behavior is normal. Judgment and thought content normal.    BP 115/78  Pulse 53  Temp(Src) 96.6 F (35.9 C) (Oral)  Ht 6\' 1"  (1.854 m)  Wt 195 lb (88.451 kg)  BMI 25.73 kg/m2   [facility] reading (PRIMARY) by  Dr. Brunilda Payor x-ray--no active disease                                      Assessment & Plan:  1. Hyperlipidemia - POCT UA - Microscopic Only - DG Chest 2 View; Future  2. Testosterone deficiency - POCT UA - Microscopic Only - POCT urinalysis dipstick - PSA, total and free  3. BPH  No orders of the defined types were placed in this encounter.   Patient Instructions  Continue current medications. Continue good therapeutic lifestyle changes which include good diet and exercise. Fall precautions discussed with patient. If an FOBT was given today- please return it to our front desk. If you are over 63 years old - you may need Prevnar 46 or the adult Pneumonia vaccine.  Please be sure and return  the FOBT We will call you with the results of the PSA test once those results are available   Arrie Senate MD

## 2014-03-17 NOTE — Patient Instructions (Addendum)
Continue current medications. Continue good therapeutic lifestyle changes which include good diet and exercise. Fall precautions discussed with patient. If an FOBT was given today- please return it to our front desk. If you are over 63 years old - you may need Prevnar 42 or the adult Pneumonia vaccine.  Please be sure and return  the FOBT We will call you with the results of the PSA test once those results are available

## 2014-03-18 ENCOUNTER — Ambulatory Visit: Payer: BC Managed Care – PPO | Admitting: Family Medicine

## 2014-03-18 ENCOUNTER — Telehealth: Payer: Self-pay

## 2014-03-18 LAB — PSA, TOTAL AND FREE
PSA, Free Pct: 16.7 %
PSA, Free: 0.2 ng/mL
PSA: 1.2 ng/mL (ref 0.0–4.0)

## 2014-03-18 NOTE — Telephone Encounter (Signed)
Message copied by Koren Bound on Fri Mar 18, 2014  2:10 PM ------      Message from: Chipper Herb      Created: Thu Mar 17, 2014  5:09 PM       As per radiology report ------

## 2014-03-18 NOTE — Telephone Encounter (Signed)
[facility] to x-ray 

## 2014-03-18 NOTE — Telephone Encounter (Signed)
Message copied by Koren Bound on Fri Mar 18, 2014  2:13 PM ------      Message from: Chipper Herb      Created: Thu Mar 17, 2014  5:09 PM       As per radiology report ------

## 2014-03-18 NOTE — Telephone Encounter (Signed)
Pt aware of CXR results.

## 2014-04-29 ENCOUNTER — Other Ambulatory Visit: Payer: Self-pay | Admitting: Nurse Practitioner

## 2014-05-02 NOTE — Telephone Encounter (Signed)
Patient last seen in office on 03-17-14. Rx last filled on 03-22-14 for 150g. Please advise on refill. If approved please route to Pool A so nurse can call patient to pick up. Rx will print

## 2014-05-02 NOTE — Telephone Encounter (Signed)
This is okay to refill for six-month 

## 2014-05-03 ENCOUNTER — Telehealth: Payer: Self-pay | Admitting: Family Medicine

## 2014-05-03 NOTE — Telephone Encounter (Signed)
Left prescription information on [facility] voicemail. Patient advised that this is a controlled substance and normally a paper prescription is given for patient to drop off. He is aware that they may not be able to fill it without the hard copy.

## 2014-05-20 ENCOUNTER — Other Ambulatory Visit: Payer: Self-pay

## 2014-06-09 ENCOUNTER — Other Ambulatory Visit: Payer: Self-pay | Admitting: Family Medicine

## 2014-06-09 ENCOUNTER — Other Ambulatory Visit: Payer: Self-pay | Admitting: Nurse Practitioner

## 2014-06-10 ENCOUNTER — Other Ambulatory Visit: Payer: Self-pay | Admitting: *Deleted

## 2014-06-10 MED ORDER — TESTOSTERONE 20.25 MG/ACT (1.62%) TD GEL: TRANSDERMAL | Status: DC

## 2014-06-10 NOTE — Telephone Encounter (Signed)
Patient last seen in office on 03-17-14. Rx last filled in [place] last month. Please advise. If approved please route to Pool A so nurse can call patient to pick up. Rx will print

## 2014-06-10 NOTE — Telephone Encounter (Signed)
Please confirm and make sure that this prescription is correct and it may be refilled as this patient comes in regularly for checkups

## 2014-06-10 NOTE — Telephone Encounter (Signed)
This medication is not on EPIC list   Saw [facility] 03/17/14

## 2014-06-10 NOTE — Telephone Encounter (Signed)
This is okay to refill for 4 months

## 2014-07-18 ENCOUNTER — Other Ambulatory Visit: Payer: Self-pay | Admitting: Nurse Practitioner

## 2014-07-19 NOTE — Telephone Encounter (Signed)
Print or call in, last filled 06/10/14, last seen 03/17/14

## 2014-08-21 IMAGING — CR DG CHEST 2V
3 series · 3 of 3 positions shown · non-contrast
Comparison: None.

CLINICAL DATA: Yearly physical.

EXAM:
CHEST  2 VIEW

[view not recorded (1 of 3)]
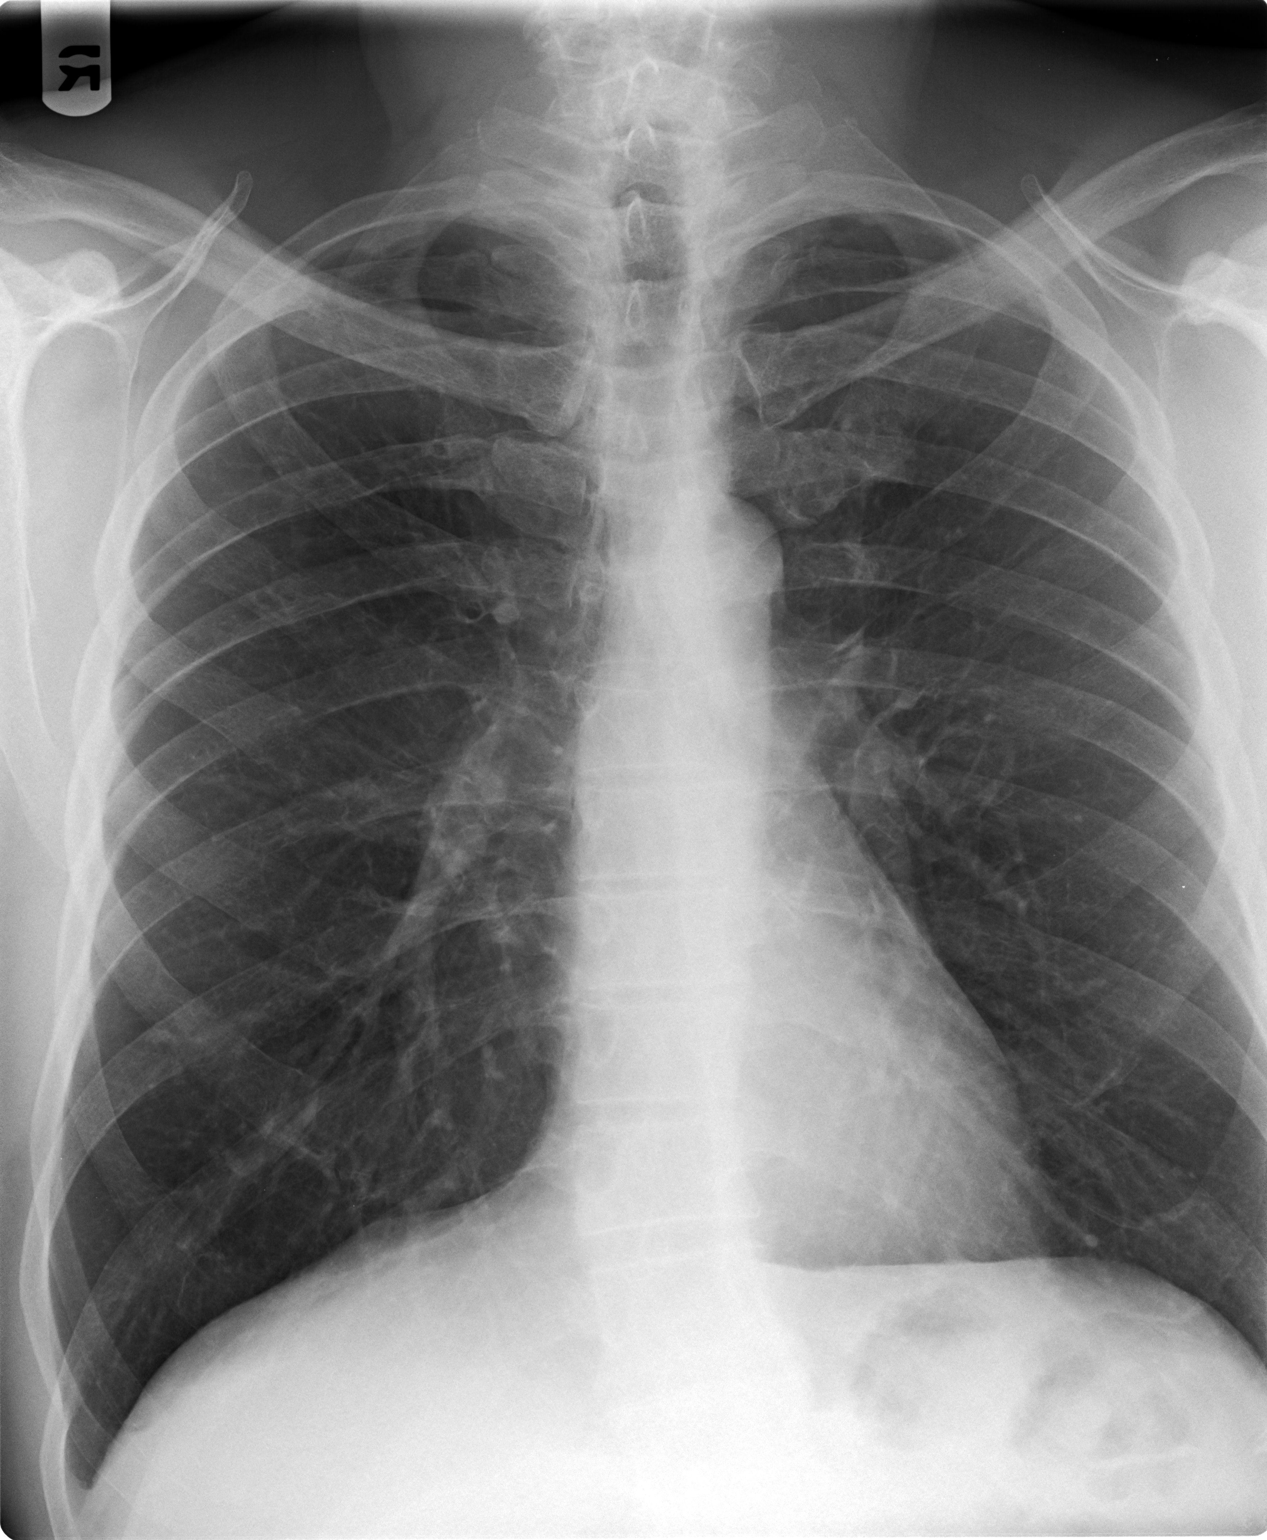

[view not recorded (2 of 3)]
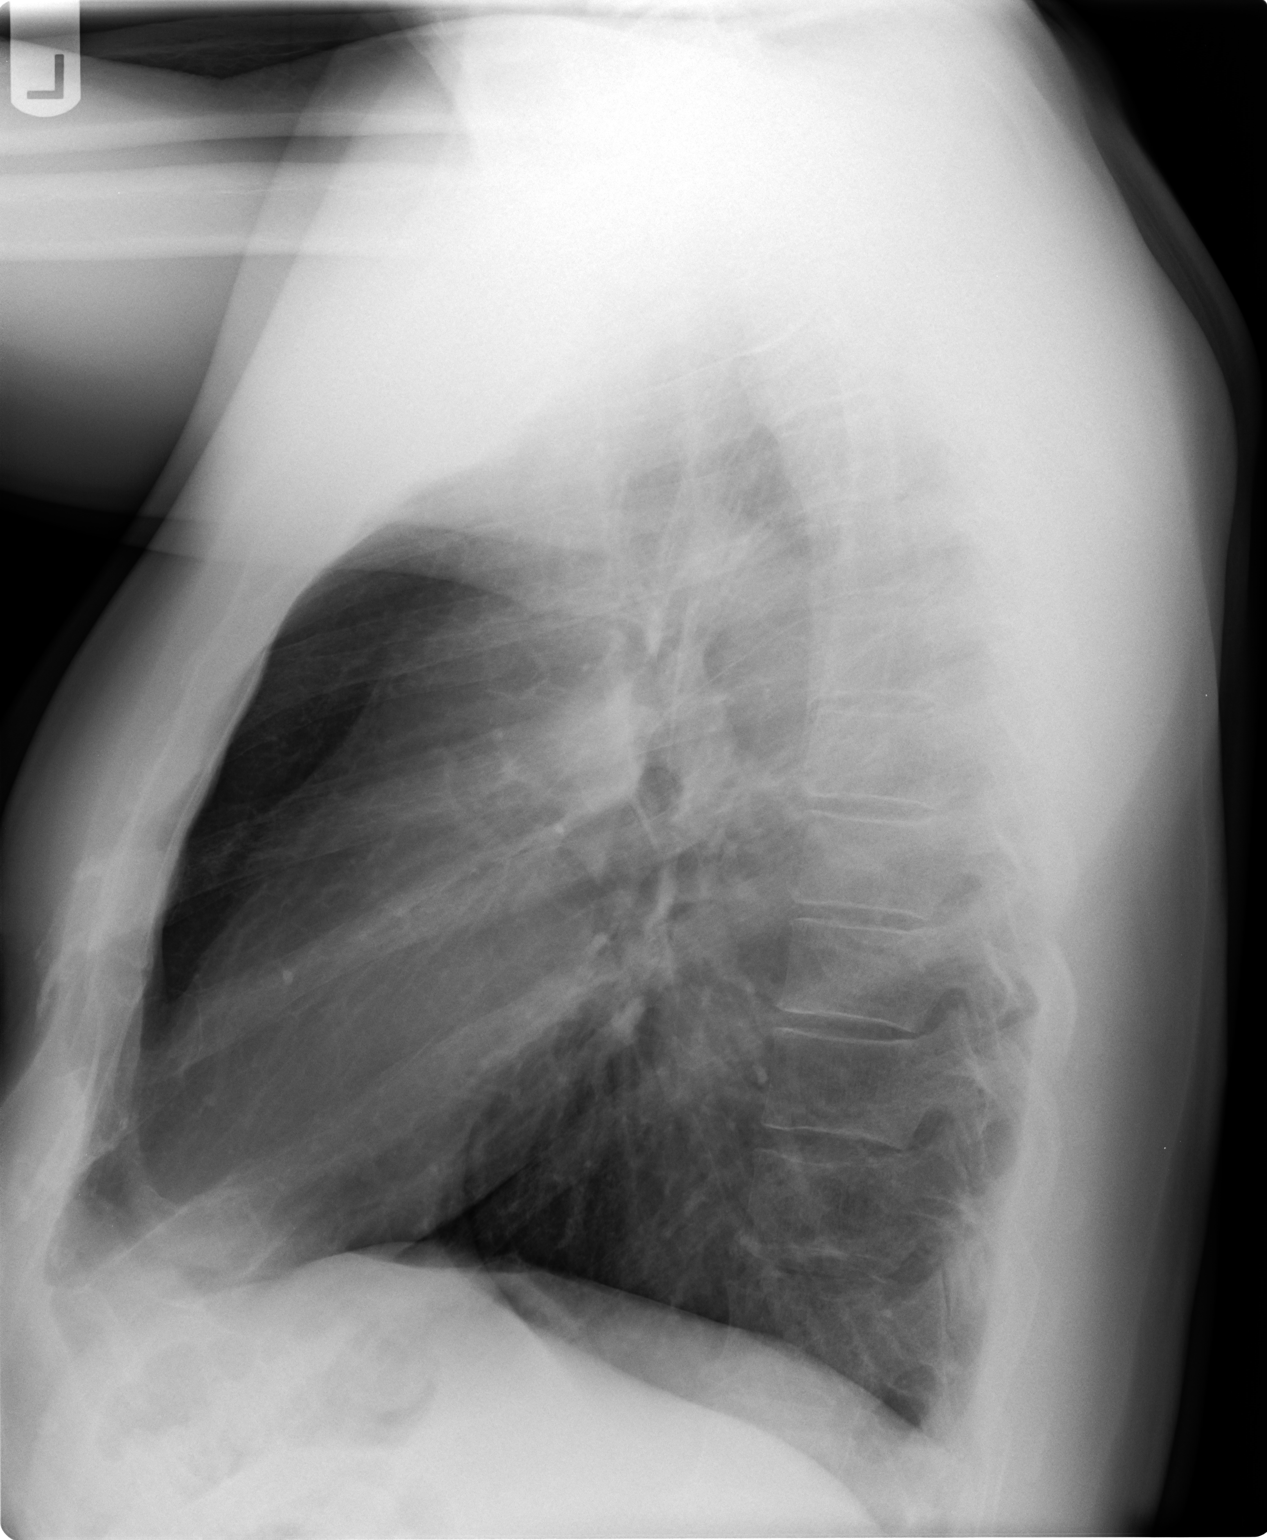

[view not recorded (3 of 3)]
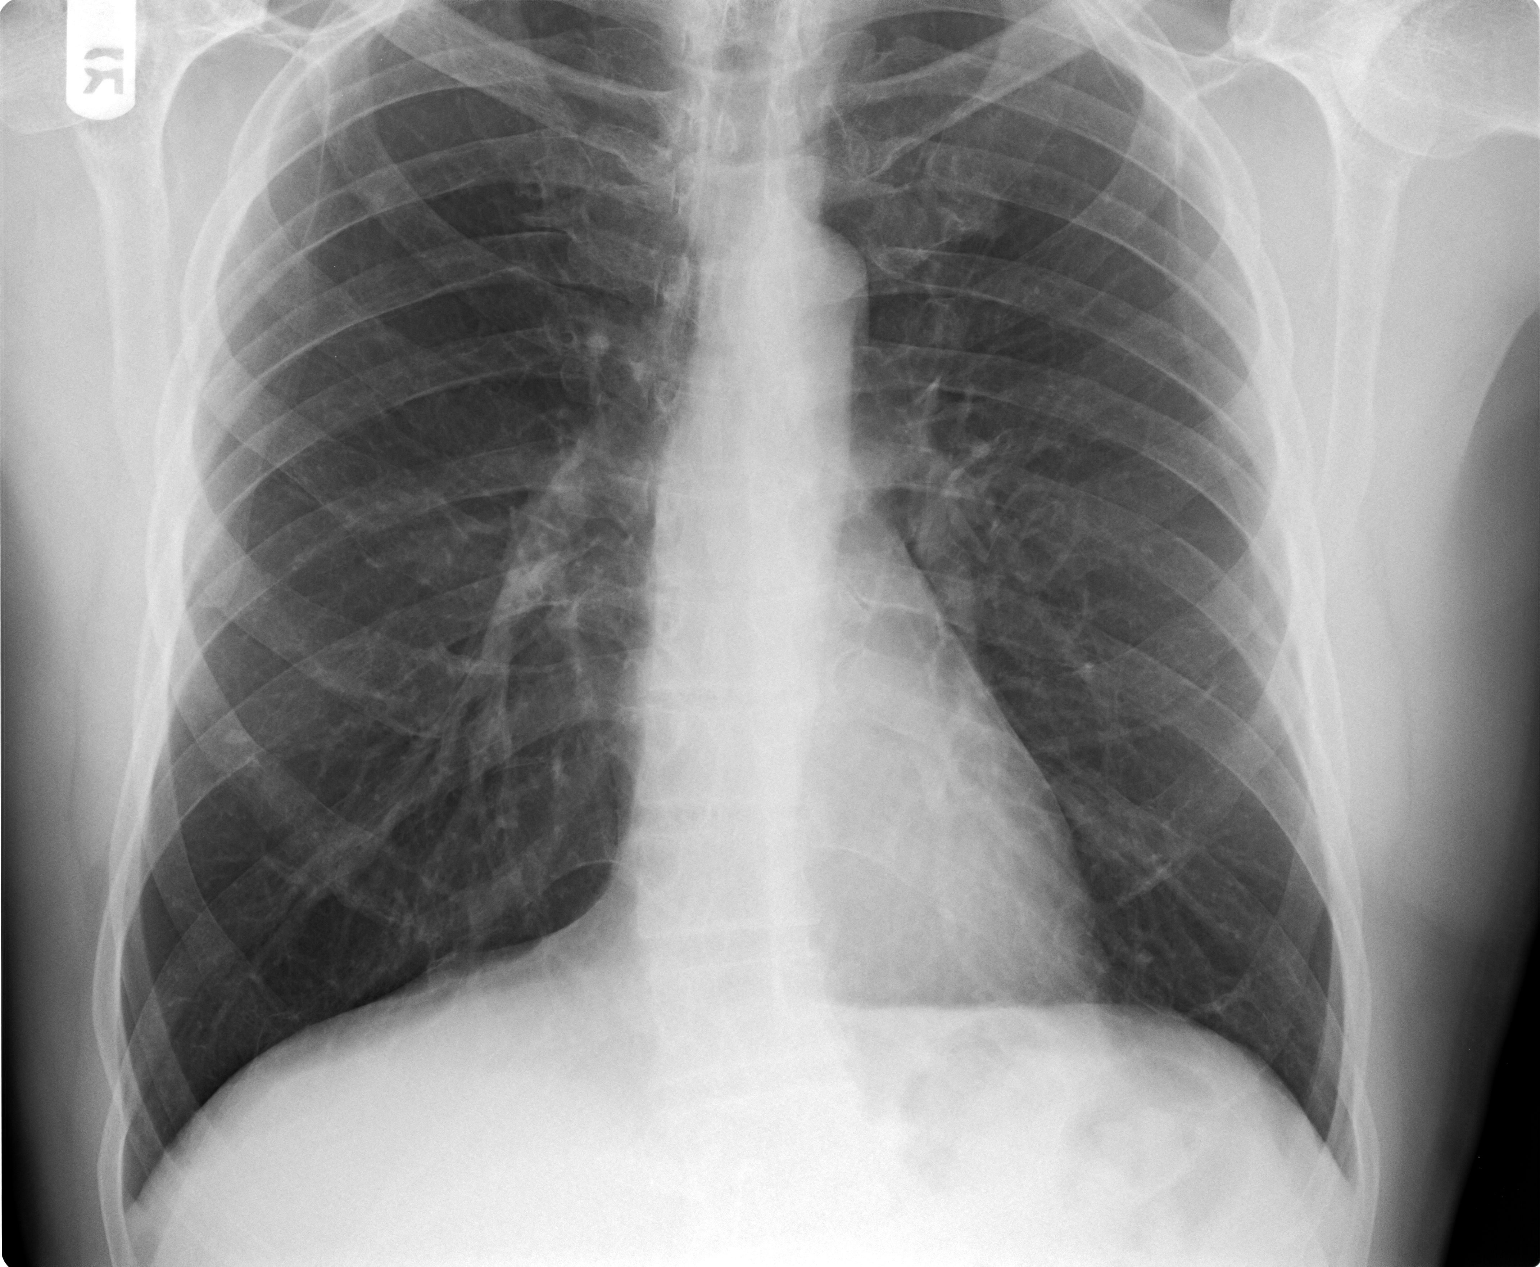

[3 of 3 positions shown; findings below may reference images not displayed]

FINDINGS: Heart, mediastinum hila are unremarkable.

Lungs are mildly hyperexpanded. Lungs are clear. No pleural effusion
or pneumothorax.

Bony thorax is intact.
IMPRESSION: No acute cardiopulmonary disease.

## 2014-09-07 ENCOUNTER — Telehealth: Payer: Self-pay | Admitting: *Deleted

## 2014-09-07 NOTE — Telephone Encounter (Signed)
[name], [name]'s ins will not cover his androgel and dr Laurance Flatten wishes you to help him determine the best alternative to get equivalent dose to current med.  Ins will cover androderm or axiron.

## 2014-09-07 NOTE — Telephone Encounter (Signed)
I would recommend Axiron - it is 30mg /actuation or pump compared to Androgel which is 20.25mg /acutation or pump.  Patient is currently using 3 acutations / pumps daily for Androgel.  New Rx would be Axiron 30mg /act appy one pump to each under arm once daily.   Dose may be increased as needed in the future to 3 or 4 pumps daily.  Please note this Rx cannot be sent electronically and must be either printed and signed by provider to called in.

## 2014-09-07 NOTE — Telephone Encounter (Signed)
Please change this prescription to [place] as determined by the clinical pharmacist and have a prescription printed for me to sign so that he can pick it up

## 2014-09-08 MED ORDER — TESTOSTERONE 30 MG/ACT TD SOLN: TRANSDERMAL | Status: DC

## 2014-09-08 NOTE — Telephone Encounter (Signed)
rx has been printed and given to Dr. Laurance Flatten to sign

## 2014-09-08 NOTE — Telephone Encounter (Signed)
Detailed message left that new rx is ready to be picked up.

## 2014-09-19 ENCOUNTER — Ambulatory Visit: Payer: BC Managed Care – PPO | Admitting: Family Medicine

## 2014-09-30 ENCOUNTER — Other Ambulatory Visit (INDEPENDENT_AMBULATORY_CARE_PROVIDER_SITE_OTHER): Payer: BLUE CROSS/BLUE SHIELD

## 2014-09-30 DIAGNOSIS — Z1212 Encounter for screening for malignant neoplasm of rectum: Secondary | ICD-10-CM

## 2014-09-30 NOTE — Progress Notes (Signed)
Lab only 

## 2014-10-02 LAB — FECAL OCCULT BLOOD, IMMUNOCHEMICAL: Fecal Occult Bld: NEGATIVE

## 2014-10-03 ENCOUNTER — Other Ambulatory Visit (INDEPENDENT_AMBULATORY_CARE_PROVIDER_SITE_OTHER): Payer: BLUE CROSS/BLUE SHIELD

## 2014-10-03 DIAGNOSIS — R739 Hyperglycemia, unspecified: Secondary | ICD-10-CM

## 2014-10-03 DIAGNOSIS — E559 Vitamin D deficiency, unspecified: Secondary | ICD-10-CM

## 2014-10-03 DIAGNOSIS — R7309 Other abnormal glucose: Secondary | ICD-10-CM | POA: Diagnosis not present

## 2014-10-03 DIAGNOSIS — E349 Endocrine disorder, unspecified: Secondary | ICD-10-CM

## 2014-10-03 DIAGNOSIS — E785 Hyperlipidemia, unspecified: Secondary | ICD-10-CM

## 2014-10-03 DIAGNOSIS — E291 Testicular hypofunction: Secondary | ICD-10-CM | POA: Diagnosis not present

## 2014-10-03 LAB — POCT CBC
Granulocyte percent: 57.4 %G (ref 37–80)
HCT, POC: 46.6 % (ref 43.5–53.7)
Hemoglobin: 14.6 g/dL (ref 14.1–18.1)
Lymph, poc: 1.9 (ref 0.6–3.4)
MCH, POC: 29.8 pg (ref 27–31.2)
MCHC: 31.3 g/dL — AB (ref 31.8–35.4)
MCV: 95.1 fL (ref 80–97)
MPV: 9.3 fL (ref 0–99.8)
POC Granulocyte: 2.7 (ref 2–6.9)
POC LYMPH PERCENT: 41.1 %L (ref 10–50)
Platelet Count, POC: 135 10*3/uL — AB (ref 142–424)
RBC: 4.9 M/uL (ref 4.69–6.13)
RDW, POC: 13 %
WBC: 4.7 10*3/uL (ref 4.6–10.2)

## 2014-10-03 LAB — POCT GLYCOSYLATED HEMOGLOBIN (HGB A1C): Hemoglobin A1C: 5.3

## 2014-10-04 ENCOUNTER — Encounter: Payer: Self-pay | Admitting: Family Medicine

## 2014-10-04 LAB — NMR, LIPOPROFILE
Cholesterol: 142 mg/dL (ref 100–199)
HDL Cholesterol by NMR: 49 mg/dL (ref >39)
HDL Particle Number: 32.6 umol/L (ref >=30.5)
LDL Particle Number: 761 nmol/L (ref <1000)
LDL Size: 20.4 nm (ref >20.5)
LDL-C: 76 mg/dL (ref 0–99)
LP-IR Score: 45 (ref <=45)
Small LDL Particle Number: 419 nmol/L (ref <=527)
Triglycerides by NMR: 83 mg/dL (ref 0–149)

## 2014-10-04 LAB — BMP8+EGFR
BUN/Creatinine Ratio: 19 (ref 10–22)
BUN: 22 mg/dL (ref 8–27)
CO2: 24 mmol/L (ref 18–29)
Calcium: 9.2 mg/dL (ref 8.6–10.2)
Chloride: 102 mmol/L (ref 97–108)
Creatinine, Ser: 1.18 mg/dL (ref 0.76–1.27)
GFR calc Af Amer: 75 mL/min/{1.73_m2} (ref >59)
GFR calc non Af Amer: 65 mL/min/{1.73_m2} (ref >59)
Glucose: 113 mg/dL — ABNORMAL HIGH (ref 65–99)
Potassium: 5 mmol/L (ref 3.5–5.2)
Sodium: 142 mmol/L (ref 134–144)

## 2014-10-04 LAB — HEPATIC FUNCTION PANEL
ALT: 27 IU/L (ref 0–44)
AST: 30 IU/L (ref 0–40)
Albumin: 4.4 g/dL (ref 3.6–4.8)
Alkaline Phosphatase: 53 IU/L (ref 39–117)
Bilirubin Total: 0.9 mg/dL (ref 0.0–1.2)
Bilirubin, Direct: 0.22 mg/dL (ref 0.00–0.40)
Total Protein: 6.6 g/dL (ref 6.0–8.5)

## 2014-10-04 LAB — TESTOSTERONE,FREE AND TOTAL
Testosterone, Free: 12.7 pg/mL (ref 6.6–18.1)
Testosterone: 220 ng/dL — ABNORMAL LOW (ref 348–1197)

## 2014-10-04 LAB — VITAMIN D 25 HYDROXY (VIT D DEFICIENCY, FRACTURES): Vit D, 25-Hydroxy: 43.6 ng/mL (ref 30.0–100.0)

## 2014-10-07 LAB — PSA, TOTAL AND FREE
PSA, Free Pct: 16.7 %
PSA, Free: 0.2 ng/mL
PSA: 1.2 ng/mL (ref 0.0–4.0)

## 2014-10-07 LAB — SPECIMEN STATUS REPORT

## 2014-10-10 ENCOUNTER — Encounter: Payer: Self-pay | Admitting: Family Medicine

## 2014-10-10 ENCOUNTER — Ambulatory Visit (INDEPENDENT_AMBULATORY_CARE_PROVIDER_SITE_OTHER): Payer: BLUE CROSS/BLUE SHIELD | Admitting: Family Medicine

## 2014-10-10 VITALS — BP 126/76 | HR 56 | Temp 96.7°F | Ht 73.0 in | Wt 203.0 lb

## 2014-10-10 DIAGNOSIS — E349 Endocrine disorder, unspecified: Secondary | ICD-10-CM

## 2014-10-10 DIAGNOSIS — E559 Vitamin D deficiency, unspecified: Secondary | ICD-10-CM

## 2014-10-10 DIAGNOSIS — Z0000 Encounter for general adult medical examination without abnormal findings: Secondary | ICD-10-CM

## 2014-10-10 DIAGNOSIS — E785 Hyperlipidemia, unspecified: Secondary | ICD-10-CM

## 2014-10-10 DIAGNOSIS — N400 Enlarged prostate without lower urinary tract symptoms: Secondary | ICD-10-CM

## 2014-10-10 NOTE — Patient Instructions (Addendum)
Continue current medications. Continue good therapeutic lifestyle changes which include good diet and exercise. Fall precautions discussed with patient. If an FOBT was given today- please return it to our front desk. If you are over 64 years old - you may need Prevnar 34 or the adult Pneumonia vaccine.  Flu Shots are still available at our office. If you still haven't had one please call to set up a nurse visit to get one.   After your visit with [place] today you will receive a survey in the mail or online from [employer] regarding your care with [place]. Please take a moment to fill this out. Your feedback is very important to [place] as you can help [place] better understand your patient needs as well as improve your experience and satisfaction. WE CARE ABOUT YOU!!!  Continue with exercise activity as you have been doing Be sure and check with your insurance regarding the Prevnar vaccine

## 2014-10-10 NOTE — Progress Notes (Signed)
Subjective:    Patient ID: [name], male    DOB: 09-04-1950, 64 y.o.   MRN: [id number]  HPI Patient is here today for annual wellness exam and follow up of chronic medical problems which includes low Testosterone, hyperlipidemia, and low Vitamin D. He is taking all medications regularly. The patient is doing well and has no complaints. He is up-to-date on all of his health maintenance issues and will check with his insurance regarding the Prevnar vaccine. He is having a little bit of left 5 pain and he is spoken with orthopedist about this but he is okay with this. The patient recent lab work was reviewed with him today and all of this was excellent his PSA is testosterone levels his liver function tests kidney function test vitamin D level and cholesterol numbers. He had no questions about any of this material and had already seen at on his chart.        Patient Active Problem List   Diagnosis Date Noted  . Testosterone deficiency 11/25/2012  . 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 Hyperlipidemia 09/25/2011   Outpatient Encounter Prescriptions as of 10/10/2014  Medication Sig  . aspirin 81 MG tablet Take 81 mg by mouth daily.  . cholecalciferol (VITAMIN D) 1000 UNITS tablet Take 3,000 Units by mouth every morning.   . fish oil-omega-3 fatty acids 1000 MG capsule Take 2 g by mouth 2 (two) times daily.   Marland Kitchen SIMCOR 500-20 MG 24 hr tablet TAKE 1 TABLET DAILY  . simvastatin (ZOCOR) 20 MG tablet Take 1 tablet (20 mg total) by mouth at bedtime.  . Testosterone (AXIRON) 30 MG/ACT SOLN Apply 1 pump under each arm daily    Review of Systems  Constitutional: Negative.   HENT: Negative.   Eyes: Negative.   Respiratory: Negative.   Cardiovascular: Negative.   Gastrointestinal: Negative.   Endocrine: Negative.   Genitourinary: Negative.   Musculoskeletal: Negative.   Skin: Negative.   Allergic/Immunologic: Negative.   Neurological: Negative.   Hematological: Negative.     Psychiatric/Behavioral: Negative.        Objective:   Physical Exam  Constitutional: He is oriented to person, place, and time. He appears well-developed and well-nourished. No distress.  HENT:  Head: Normocephalic and atraumatic.  Right Ear: External ear normal.  Left Ear: External ear normal.  Nose: Nose normal.  Mouth/Throat: Oropharynx is clear and moist. No oropharyngeal exudate.  Eyes: Conjunctivae and EOM are normal. Pupils are equal, round, and reactive to light. Right eye exhibits no discharge. Left eye exhibits no discharge. No scleral icterus.  Neck: Normal range of motion. Neck supple. No thyromegaly present.  Cardiovascular: Normal rate, regular rhythm, normal heart sounds and intact distal pulses.  Exam reveals no gallop and no friction rub.   No murmur heard. At 60/m  Pulmonary/Chest: Effort normal and breath sounds normal. No respiratory distress. He has no wheezes. He has no rales. He exhibits no tenderness.  Abdominal: Soft. Bowel sounds are normal. He exhibits no mass. There is no tenderness. There is no rebound and no guarding.  Genitourinary: Rectum normal and penis normal.  Prostate is slightly enlarged but soft and smooth. There are no rectal masses. There were no inguinal hernias. External genitalia were within normal limits.  Musculoskeletal: Normal range of motion. He exhibits no edema or tenderness.  Lymphadenopathy:    He has no cervical adenopathy.  Neurological: He is alert and oriented to person, place, and time. He has normal reflexes. No  cranial nerve deficit.  Skin: Skin is warm and dry. No rash noted. No erythema. No pallor.  Psychiatric: He has a normal mood and affect. His behavior is normal. Judgment and thought content normal.  Nursing note and vitals reviewed.  BP 126/76 mmHg  Pulse 56  Temp(Src) 96.7 F (35.9 C) (Oral)  Ht 6\' 1"  (1.854 m)  Wt 203 lb (92.08 kg)  BMI 26.79 kg/m2        Assessment & Plan:   1. Hyperlipidemia -The  patient's cholesterol numbers were excellent and he should continue with his current treatment.  2. Testosterone deficiency -His energy level is good and his testosterone levels were normal with good PSAs and he should continue with his current treatment.  3. BPH (benign prostatic hyperplasia) -The prostate remains mildly enlarged but the patient is having no symptoms with this.  4. Vitamin D deficiency -The vitamin D level was good and she should continue with current treatment  5. Annual physical exam -The patient is up-to-date on all of his health maintenance issues except for his Prevnar vaccine which she will check with insurance on.  Patient Instructions  Continue current medications. Continue good therapeutic lifestyle changes which include good diet and exercise. Fall precautions discussed with patient. If an FOBT was given today- please return it to our front desk. If you are over 78 years old - you may need Prevnar 59 or the adult Pneumonia vaccine.  Flu Shots are still available at our office. If you still haven't had one please call to set up a nurse visit to get one.   After your visit with [place] today you will receive a survey in the mail or online from [employer] regarding your care with [place]. Please take a moment to fill this out. Your feedback is very important to [place] as you can help [place] better understand your patient needs as well as improve your experience and satisfaction. WE CARE ABOUT YOU!!!  Continue with exercise activity as you have been doing Be sure and check with your insurance regarding the Prevnar vaccine   Arrie Senate MD

## 2014-10-12 NOTE — Progress Notes (Signed)
NA  10/12/14   AH

## 2014-10-12 NOTE — Progress Notes (Signed)
NA  10/12/14  AH

## 2014-10-20 ENCOUNTER — Other Ambulatory Visit: Payer: Self-pay | Admitting: Pharmacist Clinician (PhC)/ Clinical Pharmacy Specialist

## 2015-01-03 ENCOUNTER — Encounter: Payer: Self-pay | Admitting: *Deleted

## 2015-01-17 ENCOUNTER — Other Ambulatory Visit: Payer: Self-pay | Admitting: Family Medicine

## 2015-01-17 NOTE — Telephone Encounter (Signed)
Patient last seen in office on 10-10-14. Rx last filled on 09-08-14 for 25ml with 3 RFs. Please advise. If approved print and route to Pool A so nurse can call patient to pick up

## 2015-02-21 ENCOUNTER — Other Ambulatory Visit: Payer: Self-pay | Admitting: Family Medicine

## 2015-02-22 ENCOUNTER — Other Ambulatory Visit: Payer: Self-pay | Admitting: *Deleted

## 2015-02-22 MED ORDER — TESTOSTERONE 30 MG/ACT TD SOLN: TRANSDERMAL | Status: DC

## 2015-02-22 NOTE — Telephone Encounter (Signed)
Last seen 10/10/14 [facility]  If approved route to nurse to call into [facility]

## 2015-04-17 ENCOUNTER — Other Ambulatory Visit (INDEPENDENT_AMBULATORY_CARE_PROVIDER_SITE_OTHER): Payer: BLUE CROSS/BLUE SHIELD

## 2015-04-17 DIAGNOSIS — E559 Vitamin D deficiency, unspecified: Secondary | ICD-10-CM | POA: Diagnosis not present

## 2015-04-17 DIAGNOSIS — E291 Testicular hypofunction: Secondary | ICD-10-CM

## 2015-04-17 DIAGNOSIS — E785 Hyperlipidemia, unspecified: Secondary | ICD-10-CM | POA: Diagnosis not present

## 2015-04-17 DIAGNOSIS — R739 Hyperglycemia, unspecified: Secondary | ICD-10-CM

## 2015-04-17 DIAGNOSIS — R7309 Other abnormal glucose: Secondary | ICD-10-CM

## 2015-04-17 DIAGNOSIS — M542 Cervicalgia: Secondary | ICD-10-CM | POA: Diagnosis not present

## 2015-04-17 DIAGNOSIS — E349 Endocrine disorder, unspecified: Secondary | ICD-10-CM

## 2015-04-17 LAB — POCT GLYCOSYLATED HEMOGLOBIN (HGB A1C): Hemoglobin A1C: 5.2

## 2015-04-18 LAB — CBC WITH DIFFERENTIAL/PLATELET
Basophils Absolute: 0 10*3/uL (ref 0.0–0.2)
Basos: 0 %
EOS (ABSOLUTE): 0.2 10*3/uL (ref 0.0–0.4)
Eos: 3 %
Hematocrit: 44.1 % (ref 37.5–51.0)
Hemoglobin: 15.1 g/dL (ref 12.6–17.7)
Immature Grans (Abs): 0 10*3/uL (ref 0.0–0.1)
Immature Granulocytes: 0 %
Lymphocytes Absolute: 1.2 10*3/uL (ref 0.7–3.1)
Lymphs: 25 %
MCH: 32.6 pg (ref 26.6–33.0)
MCHC: 34.2 g/dL (ref 31.5–35.7)
MCV: 95 fL (ref 79–97)
Monocytes Absolute: 0.4 10*3/uL (ref 0.1–0.9)
Monocytes: 8 %
Neutrophils Absolute: 3 10*3/uL (ref 1.4–7.0)
Neutrophils: 64 %
Platelets: 158 10*3/uL (ref 150–379)
RBC: 4.63 x10E6/uL (ref 4.14–5.80)
RDW: 13.6 % (ref 12.3–15.4)
WBC: 4.8 10*3/uL (ref 3.4–10.8)

## 2015-04-18 LAB — LIPID PANEL
Chol/HDL Ratio: 3.2 ratio units (ref 0.0–5.0)
Cholesterol, Total: 145 mg/dL (ref 100–199)
HDL: 46 mg/dL (ref >39)
LDL Calculated: 68 mg/dL (ref 0–99)
Triglycerides: 154 mg/dL — ABNORMAL HIGH (ref 0–149)
VLDL Cholesterol Cal: 31 mg/dL (ref 5–40)

## 2015-04-18 LAB — BMP8+EGFR
BUN/Creatinine Ratio: 17 (ref 10–22)
BUN: 21 mg/dL (ref 8–27)
CO2: 26 mmol/L (ref 18–29)
Calcium: 9.6 mg/dL (ref 8.6–10.2)
Chloride: 100 mmol/L (ref 97–108)
Creatinine, Ser: 1.21 mg/dL (ref 0.76–1.27)
GFR calc Af Amer: 73 mL/min/{1.73_m2} (ref >59)
GFR calc non Af Amer: 63 mL/min/{1.73_m2} (ref >59)
Glucose: 111 mg/dL — ABNORMAL HIGH (ref 65–99)
Potassium: 4.9 mmol/L (ref 3.5–5.2)
Sodium: 138 mmol/L (ref 134–144)

## 2015-04-18 LAB — HEPATIC FUNCTION PANEL
ALT: 28 IU/L (ref 0–44)
AST: 34 IU/L (ref 0–40)
Albumin: 4.3 g/dL (ref 3.6–4.8)
Alkaline Phosphatase: 50 IU/L (ref 39–117)
Bilirubin Total: 0.9 mg/dL (ref 0.0–1.2)
Bilirubin, Direct: 0.2 mg/dL (ref 0.00–0.40)
Total Protein: 6.7 g/dL (ref 6.0–8.5)

## 2015-04-18 LAB — TESTOSTERONE,FREE AND TOTAL
Testosterone, Free: 10.2 pg/mL (ref 6.6–18.1)
Testosterone: 307 ng/dL — ABNORMAL LOW (ref 348–1197)

## 2015-04-18 LAB — VITAMIN D 25 HYDROXY (VIT D DEFICIENCY, FRACTURES): Vit D, 25-Hydroxy: 39.9 ng/mL (ref 30.0–100.0)

## 2015-04-19 ENCOUNTER — Telehealth: Payer: Self-pay | Admitting: Family Medicine

## 2015-04-19 ENCOUNTER — Ambulatory Visit: Payer: BLUE CROSS/BLUE SHIELD | Admitting: Family Medicine

## 2015-04-19 ENCOUNTER — Other Ambulatory Visit: Payer: Self-pay | Admitting: Family

## 2015-04-20 ENCOUNTER — Ambulatory Visit: Payer: BLUE CROSS/BLUE SHIELD | Admitting: Family Medicine

## 2015-04-20 ENCOUNTER — Other Ambulatory Visit: Payer: Self-pay | Admitting: Nurse Practitioner

## 2015-04-20 MED ORDER — TESTOSTERONE 30 MG/ACT TD SOLN
TRANSDERMAL | Status: DC
Start: 2015-04-20 — End: 2015-05-25

## 2015-04-20 NOTE — Telephone Encounter (Signed)
Patient came to pick up this RX  For [facility]

## 2015-04-20 NOTE — Telephone Encounter (Signed)
Last seen 10/10/14 [facility]  If approved route to nurse to call into [facility] 

## 2015-05-25 ENCOUNTER — Ambulatory Visit (INDEPENDENT_AMBULATORY_CARE_PROVIDER_SITE_OTHER): Payer: BLUE CROSS/BLUE SHIELD | Admitting: Family Medicine

## 2015-05-25 ENCOUNTER — Encounter: Payer: Self-pay | Admitting: Family Medicine

## 2015-05-25 VITALS — BP 118/78 | HR 53 | Temp 96.9°F | Ht 73.0 in | Wt 196.0 lb

## 2015-05-25 DIAGNOSIS — L209 Atopic dermatitis, unspecified: Secondary | ICD-10-CM | POA: Diagnosis not present

## 2015-05-25 DIAGNOSIS — N400 Enlarged prostate without lower urinary tract symptoms: Secondary | ICD-10-CM

## 2015-05-25 DIAGNOSIS — E785 Hyperlipidemia, unspecified: Secondary | ICD-10-CM | POA: Diagnosis not present

## 2015-05-25 DIAGNOSIS — E291 Testicular hypofunction: Secondary | ICD-10-CM

## 2015-05-25 DIAGNOSIS — E349 Endocrine disorder, unspecified: Secondary | ICD-10-CM

## 2015-05-25 DIAGNOSIS — E559 Vitamin D deficiency, unspecified: Secondary | ICD-10-CM

## 2015-05-25 LAB — POCT URINALYSIS DIPSTICK
Bilirubin, UA: NEGATIVE
Blood, UA: NEGATIVE
Glucose, UA: NEGATIVE
Ketones, UA: NEGATIVE
Leukocytes, UA: NEGATIVE
Nitrite, UA: NEGATIVE
Protein, UA: NEGATIVE
Spec Grav, UA: <=1.005
Urobilinogen, UA: NEGATIVE
pH, UA: 7.5

## 2015-05-25 LAB — POCT UA - MICROSCOPIC ONLY
Bacteria, U Microscopic: NEGATIVE
Casts, Ur, LPF, POC: NEGATIVE
Crystals, Ur, HPF, POC: NEGATIVE
Mucus, UA: NEGATIVE
RBC, urine, microscopic: NEGATIVE
WBC, Ur, HPF, POC: NEGATIVE
Yeast, UA: NEGATIVE

## 2015-05-25 MED ORDER — TESTOSTERONE 30 MG/ACT TD SOLN: TRANSDERMAL | Status: DC

## 2015-05-25 NOTE — Patient Instructions (Addendum)
Continue current medications. Continue good therapeutic lifestyle changes which include good diet and exercise. Fall precautions discussed with patient. If an FOBT was given today- please return it to our front desk. If you are over 64 years old - you may need Prevnar 14 or the adult Pneumonia vaccine.  **Flu shots are available--- please call and schedule a FLU-CLINIC appointment**  After your visit with [place] today you will receive a survey in the mail or online from [employer] regarding your care with [place]. Please take a moment to fill this out. Your feedback is very important to [place] as you can help [place] better understand your patient needs as well as improve your experience and satisfaction. WE CARE ABOUT YOU!!!   Use nasal saline as directed Use cortisone 10 on leg Use scent free fabric softeners and detergents and soaps (ALL brand sensitive) Vit d - NOW brand 5000 - one daily

## 2015-05-25 NOTE — Progress Notes (Signed)
Subjective:    Patient ID: [name], male    DOB: 15-Dec-1950, 64 y.o.   MRN: [id number]  HPI Pt here for follow up and management of chronic medical problems which includes Testosterone Def and hyperlipidemia. He is taking medications regularly. The patient is doing well with no specific complaints other than some nasal congestion that he has just started having. He denies chest pain or shortness of breath trouble swallowing heartburn indigestion nausea vomiting diarrhea or blood in the stool. He is passing his water without problems. He is doing well with his hip replacement. He is active physically and having no physical complaints. He does not have any fatigue and there is no sexual dysfunction. His recent lab work was reviewed with him and everything was good and no changes will be made in treatment. His triglycerides were slightly elevated and he was reminded to watch his diet more closely. His hemoglobin was good his testosterone levels were good except the total was slightly decreased but the free direct was normal.      Patient Active Problem List   Diagnosis Date Noted  . Testosterone deficiency 11/25/2012  . 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 Hyperlipidemia 09/25/2011   Outpatient Encounter Prescriptions as of 05/25/2015  Medication Sig  . aspirin 81 MG tablet Take 81 mg by mouth daily.  . cholecalciferol (VITAMIN D) 1000 UNITS tablet Take 2,000 Units by mouth 2 (two) times daily.   . fish oil-omega-3 fatty acids 1000 MG capsule Take 2 g by mouth 2 (two) times daily.   Marland Kitchen SIMCOR 500-20 MG 24 hr tablet TAKE 1 TABLET DAILY  . simvastatin (ZOCOR) 20 MG tablet TAKE ONE TABLET AT BEDTIME  . Testosterone (AXIRON) 30 MG/ACT SOLN APPLY 1 PUMP UNDER EACH ARM DAILY   No facility-administered encounter medications on file as of 05/25/2015.       Review of Systems  Constitutional: Negative.  Negative for fever.  HENT: Positive for congestion (nasal).   Eyes: Negative.     Respiratory: Negative.   Cardiovascular: Negative.   Gastrointestinal: Negative.   Endocrine: Negative.   Genitourinary: Negative.   Musculoskeletal: Negative.   Skin: Negative.        Left lower leg rash  Allergic/Immunologic: Negative.   Neurological: Negative.   Hematological: Negative.   Psychiatric/Behavioral: Negative.        Objective:   Physical Exam  Constitutional: He is oriented to person, place, and time. He appears well-developed and well-nourished.  HENT:  Head: Normocephalic and atraumatic.  Right Ear: External ear normal.  Left Ear: External ear normal.  Mouth/Throat: Oropharynx is clear and moist. No oropharyngeal exudate.  Slight nasal congestion bilaterally  Eyes: Conjunctivae and EOM are normal. Pupils are equal, round, and reactive to light. Right eye exhibits no discharge. Left eye exhibits no discharge. No scleral icterus.  Neck: Normal range of motion. Neck supple. No thyromegaly present.  Cardiovascular: Normal rate, regular rhythm, normal heart sounds and intact distal pulses.   No murmur heard. Pulmonary/Chest: Effort normal and breath sounds normal. No respiratory distress. He has no wheezes. He has no rales. He exhibits no tenderness.  Clear anteriorly and posteriorly  Abdominal: Soft. Bowel sounds are normal. He exhibits no mass. There is no tenderness. There is no rebound and no guarding.  No abdominal tenderness or organ enlargement or inguinal adenopathy  Genitourinary: Rectum normal and penis normal.  The prostate is enlarged but soft and smooth. The external genitalia were within normal limits and  no inguinal hernias were palpable. There were no rectal masses.  Musculoskeletal: Normal range of motion. He exhibits no edema.  Lymphadenopathy:    He has no cervical adenopathy.  Neurological: He is alert and oriented to person, place, and time. He has normal reflexes. No cranial nerve deficit.  Skin: Skin is warm and dry. Rash noted. No erythema.  No pallor.  There is an area of atopic dermatitis on the right medial leg.  Psychiatric: He has a normal mood and affect. His behavior is normal. Judgment and thought content normal.  Nursing note and vitals reviewed.  BP 118/78 mmHg  Pulse 53  Temp(Src) 96.9 F (36.1 C) (Oral)  Ht 6\' 1"  (1.854 m)  Wt 196 lb (88.905 kg)  BMI 25.86 kg/m2        Assessment & Plan:  1. Testosterone deficiency -Continue current testosterone replacement - PSA - POCT UA - Microscopic Only - POCT urinalysis dipstick  2. Hyperlipidemia -Continue current cholesterol medicine but try to do better with diet habits  3. BPH (benign prostatic hyperplasia) -The prostate was enlarged but soft and smooth and there were no rectal masses and the patient's having trouble passing his water.  4. Vitamin D deficiency -Increased vitamin D 10-4998 units daily  5. Atopic dermatitis -Try cortisone 10 cream over-the-counter and apply twice daily to the affected area of skin -Make sure that soaps detergents and fabric softeners are scent free  Meds ordered this encounter  Medications  . Testosterone (AXIRON) 30 MG/ACT SOLN    Sig: APPLY 1 PUMP UNDER EACH ARM DAILY    Dispense:  90 mL    Refill:  5   Patient Instructions    Continue current medications. Continue good therapeutic lifestyle changes which include good diet and exercise. Fall precautions discussed with patient. If an FOBT was given today- please return it to our front desk. If you are over 59 years old - you may need Prevnar 62 or the adult Pneumonia vaccine.  **Flu shots are available--- please call and schedule a FLU-CLINIC appointment**  After your visit with [place] today you will receive a survey in the mail or online from [employer] regarding your care with [place]. Please take a moment to fill this out. Your feedback is very important to [place] as you can help [place] better understand your patient needs as well as improve your experience and satisfaction. WE  CARE ABOUT YOU!!!   Use nasal saline as directed Use cortisone 10 on leg Use scent free fabric softeners and detergents and soaps (ALL brand sensitive) Vit d - NOW brand 5000 - one daily   Arrie Senate MD

## 2015-05-26 LAB — PSA: Prostate Specific Ag, Serum: 1.3 ng/mL (ref 0.0–4.0)

## 2015-10-19 DIAGNOSIS — M5137 Other intervertebral disc degeneration, lumbosacral region: Secondary | ICD-10-CM | POA: Insufficient documentation

## 2015-11-20 ENCOUNTER — Other Ambulatory Visit: Payer: BLUE CROSS/BLUE SHIELD

## 2015-11-20 DIAGNOSIS — E785 Hyperlipidemia, unspecified: Secondary | ICD-10-CM

## 2015-11-20 DIAGNOSIS — E349 Endocrine disorder, unspecified: Secondary | ICD-10-CM

## 2015-11-20 DIAGNOSIS — E7800 Pure hypercholesterolemia, unspecified: Secondary | ICD-10-CM

## 2015-11-20 DIAGNOSIS — E559 Vitamin D deficiency, unspecified: Secondary | ICD-10-CM

## 2015-11-21 LAB — TESTOSTERONE,FREE AND TOTAL
Testosterone, Free: 34.6 pg/mL — ABNORMAL HIGH (ref 6.6–18.1)
Testosterone: 1132 ng/dL (ref 348–1197)

## 2015-11-21 LAB — CBC WITH DIFFERENTIAL/PLATELET
Basophils Absolute: 0 10*3/uL (ref 0.0–0.2)
Basos: 1 %
EOS (ABSOLUTE): 0.1 10*3/uL (ref 0.0–0.4)
Eos: 3 %
Hematocrit: 44.2 % (ref 37.5–51.0)
Hemoglobin: 15.2 g/dL (ref 12.6–17.7)
Immature Grans (Abs): 0 10*3/uL (ref 0.0–0.1)
Immature Granulocytes: 0 %
Lymphocytes Absolute: 1.2 10*3/uL (ref 0.7–3.1)
Lymphs: 37 %
MCH: 31.8 pg (ref 26.6–33.0)
MCHC: 34.4 g/dL (ref 31.5–35.7)
MCV: 93 fL (ref 79–97)
Monocytes Absolute: 0.2 10*3/uL (ref 0.1–0.9)
Monocytes: 8 %
Neutrophils Absolute: 1.7 10*3/uL (ref 1.4–7.0)
Neutrophils: 51 %
Platelets: 147 10*3/uL — ABNORMAL LOW (ref 150–379)
RBC: 4.78 x10E6/uL (ref 4.14–5.80)
RDW: 13.8 % (ref 12.3–15.4)
WBC: 3.2 10*3/uL — ABNORMAL LOW (ref 3.4–10.8)

## 2015-11-21 LAB — HEPATIC FUNCTION PANEL
ALT: 38 IU/L (ref 0–44)
AST: 47 IU/L — ABNORMAL HIGH (ref 0–40)
Albumin: 4.4 g/dL (ref 3.6–4.8)
Alkaline Phosphatase: 54 IU/L (ref 39–117)
Bilirubin Total: 0.8 mg/dL (ref 0.0–1.2)
Bilirubin, Direct: 0.19 mg/dL (ref 0.00–0.40)
Total Protein: 7 g/dL (ref 6.0–8.5)

## 2015-11-21 LAB — BMP8+EGFR
BUN/Creatinine Ratio: 15 (ref 10–24)
BUN: 16 mg/dL (ref 8–27)
CO2: 24 mmol/L (ref 18–29)
Calcium: 9.1 mg/dL (ref 8.6–10.2)
Chloride: 103 mmol/L (ref 96–106)
Creatinine, Ser: 1.08 mg/dL (ref 0.76–1.27)
GFR calc Af Amer: 83 mL/min/{1.73_m2} (ref >59)
GFR calc non Af Amer: 72 mL/min/{1.73_m2} (ref >59)
Glucose: 108 mg/dL — ABNORMAL HIGH (ref 65–99)
Potassium: 4.8 mmol/L (ref 3.5–5.2)
Sodium: 141 mmol/L (ref 134–144)

## 2015-11-21 LAB — LIPID PANEL
Chol/HDL Ratio: 4 ratio units (ref 0.0–5.0)
Cholesterol, Total: 216 mg/dL — ABNORMAL HIGH (ref 100–199)
HDL: 54 mg/dL (ref >39)
LDL Calculated: 141 mg/dL — ABNORMAL HIGH (ref 0–99)
Triglycerides: 103 mg/dL (ref 0–149)
VLDL Cholesterol Cal: 21 mg/dL (ref 5–40)

## 2015-11-21 LAB — VITAMIN D 25 HYDROXY (VIT D DEFICIENCY, FRACTURES): Vit D, 25-Hydroxy: 64 ng/mL (ref 30.0–100.0)

## 2015-11-24 ENCOUNTER — Ambulatory Visit (INDEPENDENT_AMBULATORY_CARE_PROVIDER_SITE_OTHER): Payer: BLUE CROSS/BLUE SHIELD | Admitting: Family Medicine

## 2015-11-24 ENCOUNTER — Encounter: Payer: Self-pay | Admitting: Family Medicine

## 2015-11-24 VITALS — BP 118/77 | HR 62 | Temp 97.2°F | Ht 73.0 in | Wt 203.0 lb

## 2015-11-24 DIAGNOSIS — N400 Enlarged prostate without lower urinary tract symptoms: Secondary | ICD-10-CM

## 2015-11-24 DIAGNOSIS — J301 Allergic rhinitis due to pollen: Secondary | ICD-10-CM

## 2015-11-24 DIAGNOSIS — E785 Hyperlipidemia, unspecified: Secondary | ICD-10-CM

## 2015-11-24 DIAGNOSIS — Z1211 Encounter for screening for malignant neoplasm of colon: Secondary | ICD-10-CM

## 2015-11-24 DIAGNOSIS — Z0000 Encounter for general adult medical examination without abnormal findings: Secondary | ICD-10-CM

## 2015-11-24 DIAGNOSIS — E349 Endocrine disorder, unspecified: Secondary | ICD-10-CM

## 2015-11-24 DIAGNOSIS — E559 Vitamin D deficiency, unspecified: Secondary | ICD-10-CM

## 2015-11-24 LAB — URINALYSIS, COMPLETE
Bilirubin, UA: NEGATIVE
Glucose, UA: NEGATIVE
Ketones, UA: NEGATIVE
Leukocytes, UA: NEGATIVE
Nitrite, UA: NEGATIVE
Protein, UA: NEGATIVE
RBC, UA: NEGATIVE
Specific Gravity, UA: 1.01 (ref 1.005–1.030)
Urobilinogen, Ur: 0.2 mg/dL (ref 0.2–1.0)
pH, UA: 6.5 (ref 5.0–7.5)

## 2015-11-24 LAB — MICROSCOPIC EXAMINATION
Bacteria, UA: NONE SEEN
Epithelial Cells (non renal): NONE SEEN /hpf (ref 0–10)
RBC, UA: NONE SEEN /hpf (ref 0–?)
WBC, UA: NONE SEEN /hpf (ref 0–?)

## 2015-11-24 MED ORDER — ROSUVASTATIN CALCIUM 10 MG PO TABS: 10.0000 mg | ORAL_TABLET | Freq: Every day | ORAL | Status: DC

## 2015-11-24 MED ORDER — FLUTICASONE PROPIONATE 50 MCG/ACT NA SUSP: 2.0000 | Freq: Every day | NASAL | Status: DC

## 2015-11-24 NOTE — Progress Notes (Signed)
Subjective:    Patient ID: [name], male    DOB: Nov 21, 1950, 66 y.o.   MRN: [id number]  HPI Patient is here today for annual wellness exam and follow up of chronic medical problems which includes hyperlipidemia. He is taking medications regularly. The patient's recent lab work will be reviewed with him today. His cholesterol numbers were much higher on the recent blood work and the patient recently went to simvastatin by itself at 20 mg daily. He's no longer taking Simcor. He also recently took a trip overseas and 8 a lot of food and did not take his cholesterol medicine while he was away. This could explain the reason for the elevated cholesterol numbers. The remainder of his blood work looked good and the platelet count was slightly decreased and 1 liver function tests was slightly increased and we will repeat these again in 4 weeks. Since the patient is not taking Simcor anymore we will most likely switch him to a different statin, i.e. Crestor and recheck a lipid panel in 3 months. The patient does complain today of increased congestion and hoarseness. The patient denies any chest pain or shortness of breath. He does have occasional heartburn and takes omeprazole only on an as-needed basis for this. Otherwise his bowel habits are stable there is no blood in the stool or black tarry bowel movements. He is passing his water without problems and occasionally has to get up at nighttime to go to the bathroom. His energy level was good and his sexual function is good. He has seen the eye doctor in the past couple weeks and everything was stable with this and he'll get him to send [place] a copy of that report.     Patient Active Problem List   Diagnosis Date Noted  . Testosterone deficiency 11/25/2012  . 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 Hyperlipidemia 09/25/2011   Outpatient Encounter Prescriptions as of 11/24/2015  Medication Sig  . aspirin 81 MG tablet Take 81 mg by mouth daily.  .  cholecalciferol (VITAMIN D) 1000 UNITS tablet Take 2,000 Units by mouth 2 (two) times daily.   . fish oil-omega-3 fatty acids 1000 MG capsule Take 2 g by mouth 2 (two) times daily.   . Testosterone (AXIRON) 30 MG/ACT SOLN APPLY 1 PUMP UNDER EACH ARM DAILY  . [DISCONTINUED] SIMCOR 500-20 MG 24 hr tablet TAKE 1 TABLET DAILY  . simvastatin (ZOCOR) 20 MG tablet Take 1 tablet by mouth daily.  . [DISCONTINUED] simvastatin (ZOCOR) 20 MG tablet TAKE ONE TABLET AT BEDTIME   No facility-administered encounter medications on file as of 11/24/2015.     Review of Systems  Constitutional: Negative.   HENT: Positive for congestion and voice change.   Eyes: Negative.   Respiratory: Negative.   Cardiovascular: Negative.   Gastrointestinal: Negative.   Endocrine: Negative.   Genitourinary: Negative.   Musculoskeletal: Negative.   Skin: Negative.   Allergic/Immunologic: Negative.   Neurological: Negative.   Hematological: Negative.   Psychiatric/Behavioral: Negative.        Objective:   Physical Exam  Constitutional: He is oriented to person, place, and time. He appears well-developed and well-nourished. No distress.  HENT:  Head: Normocephalic and atraumatic.  Right Ear: External ear normal.  Left Ear: External ear normal.  Mouth/Throat: Oropharynx is clear and moist. No oropharyngeal exudate.  Minimal nasal congestion and throat is clear  Eyes: Conjunctivae and EOM are normal. Pupils are equal, round, and reactive to light. Right eye exhibits no  discharge. Left eye exhibits no discharge. No scleral icterus.  Neck: Normal range of motion. Neck supple. No tracheal deviation present. No thyromegaly present.  No bruits thyromegaly or adenopathy  Cardiovascular: Normal rate, regular rhythm, normal heart sounds and intact distal pulses.   No murmur heard. The rhythm is regular at 72/m  Pulmonary/Chest: Effort normal and breath sounds normal. No respiratory distress. He has no wheezes. He has no  rales. He exhibits no tenderness.  Clear anteriorly and posteriorly  Abdominal: Soft. Bowel sounds are normal. He exhibits no mass. There is no tenderness. There is no rebound and no guarding.  Genitourinary: Rectum normal and penis normal.  Minimal enlargement with no lumps or masses. Rectal exam was clear. There are no inguinal hernias palpable and no inguinal adenopathy. The external genitalia was within normal limits.  Musculoskeletal: Normal range of motion. He exhibits no edema or tenderness.  Lymphadenopathy:    He has no cervical adenopathy.  Neurological: He is alert and oriented to person, place, and time. He has normal reflexes. No cranial nerve deficit.  Skin: Skin is warm and dry. No rash noted.  Psychiatric: He has a normal mood and affect. His behavior is normal. Judgment and thought content normal.  Nursing note and vitals reviewed.  BP 118/77 mmHg  Pulse 62  Temp(Src) 97.2 F (36.2 C) (Oral)  Ht 6\' 1"  (1.854 m)  Wt 203 lb (92.08 kg)  BMI 26.79 kg/m2        Assessment & Plan:  1. Testosterone deficiency -Continue testosterone replacement with Axiron  2. Hyperlipidemia -Discontinue simvastatin and start Crestor 10 mg daily and recheck lipid liver panel in 3 months and work on diet better since back from the trip - Urinalysis, Complete - CBC with Differential/Platelet; Future - Hepatic function panel; Future - Lipid panel; Future  3. Annual physical exam -Return FOBT - Urinalysis, Complete  4. BPH (benign prostatic hyperplasia) -The prostate is stable with no lumps or masses noted. - Urinalysis, Complete  5. Vitamin D deficiency -Continue current vitamin D replacement  6. Special screening for malignant neoplasms, colon - Fecal occult blood, imunochemical; Future  7. Allergic rhinitis due to pollen -Use nasal saline frequently in each nostril during the day -Discontinue the use of the fan in the bedroom especially in the winter and even now because it  dries you out too much -Use Flonase nasal spray 1-2 sprays each nostril at bedtime -Discontinue any thing with diphenhydramine as regular use of this as we get older can increase the risk of dementia -If something is needed for sleep use melatonin 3 mg  Meds ordered this encounter  Medications  . DISCONTD: simvastatin (ZOCOR) 20 MG tablet    Sig: Take 1 tablet by mouth daily.  . rosuvastatin (CRESTOR) 10 MG tablet    Sig: Take 1 tablet (10 mg total) by mouth daily.    Dispense:  90 tablet    Refill:  3  . fluticasone (FLONASE) 50 MCG/ACT nasal spray    Sig: Place 2 sprays into both nostrils daily.    Dispense:  16 g    Refill:  6   Patient Instructions  Continue current medications. Continue good therapeutic lifestyle changes which include good diet and exercise. Fall precautions discussed with patient. If an FOBT was given today- please return it to our front desk. If you are over 17 years old - you may need Prevnar 88 or the adult Pneumonia vaccine.  **Flu shots are available--- please call and schedule  a FLU-CLINIC appointment**  After your visit with [place] today you will receive a survey in the mail or online from [employer] regarding your care with [place]. Please take a moment to fill this out. Your feedback is very important to [place] as you can help [place] better understand your patient needs as well as improve your experience and satisfaction. WE CARE ABOUT YOU!!!   Patient can try melatonin 3 mg at bedtime He should stop anything on a regular basis that has diphenhydramine or Benadryl) as this increases the risk of dementia This would include anything like Tylenol PM or Aleve PM. He should also avoid any kind of diet drinks He should return to the office in 4 weeks to repeat liver function test and CBC. He should return to the office in about 3 months and get a traditional lipid liver panel because of changing his statin drug to Crestor   Arrie Senate MD

## 2015-11-24 NOTE — Patient Instructions (Addendum)
Continue current medications. Continue good therapeutic lifestyle changes which include good diet and exercise. Fall precautions discussed with patient. If an FOBT was given today- please return it to our front desk. If you are over 65 years old - you may need Prevnar 71 or the adult Pneumonia vaccine.  **Flu shots are available--- please call and schedule a FLU-CLINIC appointment**  After your visit with [place] today you will receive a survey in the mail or online from [employer] regarding your care with [place]. Please take a moment to fill this out. Your feedback is very important to [place] as you can help [place] better understand your patient needs as well as improve your experience and satisfaction. WE CARE ABOUT YOU!!!   Patient can try melatonin 3 mg at bedtime He should stop anything on a regular basis that has diphenhydramine or Benadryl) as this increases the risk of dementia This would include anything like Tylenol PM or Aleve PM. He should also avoid any kind of diet drinks He should return to the office in 4 weeks to repeat liver function test and CBC. He should return to the office in about 3 months and get a traditional lipid liver panel because of changing his statin drug to Crestor

## 2015-11-27 LAB — PSA TOTAL+% FREE (SERIAL)
PSA, Free Pct: 17.1 %
PSA, Free: 0.24 ng/mL
Prostate Specific Ag, Serum: 1.4 ng/mL (ref 0.0–4.0)

## 2015-11-27 LAB — SPECIMEN STATUS REPORT

## 2015-12-25 ENCOUNTER — Other Ambulatory Visit: Payer: Self-pay | Admitting: Family Medicine

## 2015-12-26 NOTE — Telephone Encounter (Signed)
Pt aware med phoned in

## 2015-12-26 NOTE — Telephone Encounter (Signed)
Last seen 11/24/15  DWM  If approved route to nurse to call into [facility]

## 2016-01-02 ENCOUNTER — Other Ambulatory Visit: Payer: BLUE CROSS/BLUE SHIELD

## 2016-01-02 DIAGNOSIS — E785 Hyperlipidemia, unspecified: Secondary | ICD-10-CM

## 2016-01-02 DIAGNOSIS — E7800 Pure hypercholesterolemia, unspecified: Secondary | ICD-10-CM

## 2016-01-02 LAB — LIPID PANEL
Chol/HDL Ratio: 2.5 ratio units (ref 0.0–5.0)
Cholesterol, Total: 118 mg/dL (ref 100–199)
HDL: 48 mg/dL (ref >39)
LDL Calculated: 53 mg/dL (ref 0–99)
Triglycerides: 84 mg/dL (ref 0–149)
VLDL Cholesterol Cal: 17 mg/dL (ref 5–40)

## 2016-01-02 LAB — CBC WITH DIFFERENTIAL/PLATELET
Basophils Absolute: 0 10*3/uL (ref 0.0–0.2)
Basos: 0 %
EOS (ABSOLUTE): 0.2 10*3/uL (ref 0.0–0.4)
Eos: 3 %
Hematocrit: 42.1 % (ref 37.5–51.0)
Hemoglobin: 14.8 g/dL (ref 12.6–17.7)
Immature Grans (Abs): 0 10*3/uL (ref 0.0–0.1)
Immature Granulocytes: 0 %
Lymphocytes Absolute: 1.4 10*3/uL (ref 0.7–3.1)
Lymphs: 23 %
MCH: 32.2 pg (ref 26.6–33.0)
MCHC: 35.2 g/dL (ref 31.5–35.7)
MCV: 92 fL (ref 79–97)
Monocytes Absolute: 0.5 10*3/uL (ref 0.1–0.9)
Monocytes: 8 %
Neutrophils Absolute: 4 10*3/uL (ref 1.4–7.0)
Neutrophils: 66 %
Platelets: 148 10*3/uL — ABNORMAL LOW (ref 150–379)
RBC: 4.59 x10E6/uL (ref 4.14–5.80)
RDW: 13.4 % (ref 12.3–15.4)
WBC: 6.1 10*3/uL (ref 3.4–10.8)

## 2016-02-08 ENCOUNTER — Ambulatory Visit (INDEPENDENT_AMBULATORY_CARE_PROVIDER_SITE_OTHER): Payer: BLUE CROSS/BLUE SHIELD | Admitting: Pediatrics

## 2016-02-08 ENCOUNTER — Encounter: Payer: Self-pay | Admitting: Pediatrics

## 2016-02-08 VITALS — BP 133/86 | HR 61 | Temp 97.7°F | Ht 73.0 in | Wt 202.4 lb

## 2016-02-08 DIAGNOSIS — J069 Acute upper respiratory infection, unspecified: Secondary | ICD-10-CM

## 2016-02-08 NOTE — Progress Notes (Signed)
    Subjective:    Patient ID: [name], male    DOB: [birth year], 65 y.o.   MRN: [id number]  CC: Chest congestion, Cough   HPI: [name] is a 65 y.o. male presenting for Chest congestion and Cough  Taking mucinex Used to be smoker No fevers Phelgm in the morning Feels weak and tired past few days Coughs up phlegm in morning Feels a little "ditsy" he thinks from the mucinex, ends up rereading the same paragraph multiple times at work Has been mowing the lawn, seems to get worse after mowing   Depression screen [facility] 2/9 02/08/2016 11/24/2015 05/25/2015 10/10/2014  Decreased Interest 0 0 0 0  Down, Depressed, Hopeless 0 0 0 0  PHQ - 2 Score 0 0 0 0     Relevant past medical, surgical, family and social history reviewed and updated as indicated.  Interim medical history since our last visit reviewed. Allergies and medications reviewed and updated.  ROS: Per HPI unless specifically indicated above  History  Smoking status  . Former Smoker -- 1.00 packs/day  . Types: Cigarettes  . Start date: 04/02/1983  . Quit date: 04/01/1988  Smokeless tobacco  . Never Used       Objective:    BP 133/86 mmHg  Pulse 61  Temp(Src) 97.7 F (36.5 C) (Oral)  Ht 6\' 1"  (1.854 m)  Wt 202 lb 6.4 oz (91.808 kg)  BMI 26.71 kg/m2  Wt Readings from Last 3 Encounters:  02/08/16 202 lb 6.4 oz (91.808 kg)  11/24/15 203 lb (92.08 kg)  05/25/15 196 lb (88.905 kg)     Gen: NAD, alert, cooperative with exam, NCAT EYES: EOMI, no scleral injection or icterus ENT:  TMs pearly gray b/l, OP with mild erythema, red nasal turbinates LYMPH: no cervical LAD CV: NRRR, normal S1/S2, no murmur, distal pulses 2+ b/l Resp: CTABL, no wheezes but slightly prolonged exp phase, normal WOB Neuro: Alert and oriented    Assessment & Plan:    [name] was seen today for chest congestion, weak feeling. Likely due to viral URI. Ongoing for apprx 6 days, feeling slightly better than initially. Discussed  normal course of illness, rec using antihistamines, flonase. Return precautions given.  Diagnoses and all orders for this visit:  Acute URI    Follow up plan: Return if symptoms worsen or fail to improve.  Assunta Found, MD [facility] 02/08/2016, 3:38 PM

## 2016-02-08 NOTE — Patient Instructions (Signed)
flonase two sprays each nostril in 24hours  Use cetirizine (zyrtec) 10mg  once a day

## 2016-03-06 ENCOUNTER — Other Ambulatory Visit: Payer: BLUE CROSS/BLUE SHIELD

## 2016-03-06 DIAGNOSIS — E785 Hyperlipidemia, unspecified: Secondary | ICD-10-CM

## 2016-03-06 DIAGNOSIS — E349 Endocrine disorder, unspecified: Secondary | ICD-10-CM

## 2016-03-06 DIAGNOSIS — N400 Enlarged prostate without lower urinary tract symptoms: Secondary | ICD-10-CM

## 2016-03-06 DIAGNOSIS — E559 Vitamin D deficiency, unspecified: Secondary | ICD-10-CM

## 2016-03-07 LAB — PSA, TOTAL AND FREE
PSA, Free Pct: 18.3 %
PSA, Free: 0.22 ng/mL
Prostate Specific Ag, Serum: 1.2 ng/mL (ref 0.0–4.0)

## 2016-03-07 LAB — CBC WITH DIFFERENTIAL/PLATELET
Basophils Absolute: 0.1 10*3/uL (ref 0.0–0.2)
Basos: 2 %
EOS (ABSOLUTE): 0.1 10*3/uL (ref 0.0–0.4)
Eos: 3 %
Hematocrit: 44.5 % (ref 37.5–51.0)
Hemoglobin: 15.3 g/dL (ref 12.6–17.7)
Immature Grans (Abs): 0 10*3/uL (ref 0.0–0.1)
Immature Granulocytes: 0 %
Lymphocytes Absolute: 1.2 10*3/uL (ref 0.7–3.1)
Lymphs: 33 %
MCH: 32 pg (ref 26.6–33.0)
MCHC: 34.4 g/dL (ref 31.5–35.7)
MCV: 93 fL (ref 79–97)
Monocytes Absolute: 0.3 10*3/uL (ref 0.1–0.9)
Monocytes: 9 %
Neutrophils Absolute: 1.9 10*3/uL (ref 1.4–7.0)
Neutrophils: 53 %
Platelets: 143 10*3/uL — ABNORMAL LOW (ref 150–379)
RBC: 4.78 x10E6/uL (ref 4.14–5.80)
RDW: 13.4 % (ref 12.3–15.4)
WBC: 3.6 10*3/uL (ref 3.4–10.8)

## 2016-03-07 LAB — LIPID PANEL
Chol/HDL Ratio: 2.7 ratio units (ref 0.0–5.0)
Cholesterol, Total: 116 mg/dL (ref 100–199)
HDL: 43 mg/dL (ref >39)
LDL Calculated: 60 mg/dL (ref 0–99)
Triglycerides: 64 mg/dL (ref 0–149)
VLDL Cholesterol Cal: 13 mg/dL (ref 5–40)

## 2016-03-07 LAB — TESTOSTERONE,FREE AND TOTAL
Testosterone, Free: 34.2 pg/mL — ABNORMAL HIGH (ref 6.6–18.1)
Testosterone: 1310 ng/dL — ABNORMAL HIGH (ref 264–916)

## 2016-03-07 LAB — BMP8+EGFR
BUN/Creatinine Ratio: 11 (ref 10–24)
BUN: 13 mg/dL (ref 8–27)
CO2: 26 mmol/L (ref 18–29)
Calcium: 9.3 mg/dL (ref 8.6–10.2)
Chloride: 102 mmol/L (ref 96–106)
Creatinine, Ser: 1.18 mg/dL (ref 0.76–1.27)
GFR calc Af Amer: 75 mL/min/{1.73_m2} (ref >59)
GFR calc non Af Amer: 65 mL/min/{1.73_m2} (ref >59)
Glucose: 103 mg/dL — ABNORMAL HIGH (ref 65–99)
Potassium: 4.7 mmol/L (ref 3.5–5.2)
Sodium: 141 mmol/L (ref 134–144)

## 2016-03-07 LAB — HEPATIC FUNCTION PANEL
ALT: 29 IU/L (ref 0–44)
AST: 27 IU/L (ref 0–40)
Albumin: 4.1 g/dL (ref 3.6–4.8)
Alkaline Phosphatase: 57 IU/L (ref 39–117)
Bilirubin Total: 0.7 mg/dL (ref 0.0–1.2)
Bilirubin, Direct: 0.21 mg/dL (ref 0.00–0.40)
Total Protein: 6.6 g/dL (ref 6.0–8.5)

## 2016-03-07 LAB — VITAMIN D 25 HYDROXY (VIT D DEFICIENCY, FRACTURES): Vit D, 25-Hydroxy: 65.4 ng/mL (ref 30.0–100.0)

## 2016-05-22 ENCOUNTER — Other Ambulatory Visit (INDEPENDENT_AMBULATORY_CARE_PROVIDER_SITE_OTHER): Payer: BLUE CROSS/BLUE SHIELD

## 2016-05-22 DIAGNOSIS — E349 Endocrine disorder, unspecified: Secondary | ICD-10-CM

## 2016-05-22 DIAGNOSIS — E7849 Other hyperlipidemia: Secondary | ICD-10-CM

## 2016-05-23 ENCOUNTER — Encounter: Payer: Self-pay | Admitting: Family Medicine

## 2016-05-23 DIAGNOSIS — D696 Thrombocytopenia, unspecified: Secondary | ICD-10-CM | POA: Insufficient documentation

## 2016-05-23 LAB — BMP8+EGFR
BUN/Creatinine Ratio: 18 (ref 10–24)
BUN: 19 mg/dL (ref 8–27)
CO2: 26 mmol/L (ref 18–29)
Calcium: 10 mg/dL (ref 8.6–10.2)
Chloride: 97 mmol/L (ref 96–106)
Creatinine, Ser: 1.07 mg/dL (ref 0.76–1.27)
GFR calc Af Amer: 84 mL/min/{1.73_m2} (ref >59)
GFR calc non Af Amer: 73 mL/min/{1.73_m2} (ref >59)
Glucose: 113 mg/dL — ABNORMAL HIGH (ref 65–99)
Potassium: 4.6 mmol/L (ref 3.5–5.2)
Sodium: 139 mmol/L (ref 134–144)

## 2016-05-23 LAB — HEPATIC FUNCTION PANEL
ALT: 35 IU/L (ref 0–44)
AST: 30 IU/L (ref 0–40)
Albumin: 4.5 g/dL (ref 3.6–4.8)
Alkaline Phosphatase: 56 IU/L (ref 39–117)
Bilirubin Total: 0.9 mg/dL (ref 0.0–1.2)
Bilirubin, Direct: 0.21 mg/dL (ref 0.00–0.40)
Total Protein: 7 g/dL (ref 6.0–8.5)

## 2016-05-23 LAB — TESTOSTERONE,FREE AND TOTAL
Testosterone, Free: 17.8 pg/mL (ref 6.6–18.1)
Testosterone: 501 ng/dL (ref 264–916)

## 2016-05-23 LAB — CBC WITH DIFFERENTIAL/PLATELET
Basophils Absolute: 0 10*3/uL (ref 0.0–0.2)
Basos: 1 %
EOS (ABSOLUTE): 0.1 10*3/uL (ref 0.0–0.4)
Eos: 4 %
Hematocrit: 44.2 % (ref 37.5–51.0)
Hemoglobin: 15.2 g/dL (ref 12.6–17.7)
Immature Grans (Abs): 0 10*3/uL (ref 0.0–0.1)
Immature Granulocytes: 0 %
Lymphocytes Absolute: 1.4 10*3/uL (ref 0.7–3.1)
Lymphs: 38 %
MCH: 32.1 pg (ref 26.6–33.0)
MCHC: 34.4 g/dL (ref 31.5–35.7)
MCV: 93 fL (ref 79–97)
Monocytes Absolute: 0.3 10*3/uL (ref 0.1–0.9)
Monocytes: 8 %
Neutrophils Absolute: 1.9 10*3/uL (ref 1.4–7.0)
Neutrophils: 49 %
Platelets: 148 10*3/uL — ABNORMAL LOW (ref 150–379)
RBC: 4.74 x10E6/uL (ref 4.14–5.80)
RDW: 13.3 % (ref 12.3–15.4)
WBC: 3.7 10*3/uL (ref 3.4–10.8)

## 2016-05-23 LAB — LIPID PANEL
Chol/HDL Ratio: 2.9 ratio units (ref 0.0–5.0)
Cholesterol, Total: 136 mg/dL (ref 100–199)
HDL: 47 mg/dL (ref >39)
LDL Calculated: 68 mg/dL (ref 0–99)
Triglycerides: 103 mg/dL (ref 0–149)
VLDL Cholesterol Cal: 21 mg/dL (ref 5–40)

## 2016-05-28 ENCOUNTER — Encounter: Payer: Self-pay | Admitting: Family Medicine

## 2016-05-28 ENCOUNTER — Ambulatory Visit (INDEPENDENT_AMBULATORY_CARE_PROVIDER_SITE_OTHER): Payer: BLUE CROSS/BLUE SHIELD | Admitting: Family Medicine

## 2016-05-28 ENCOUNTER — Ambulatory Visit (INDEPENDENT_AMBULATORY_CARE_PROVIDER_SITE_OTHER): Payer: BLUE CROSS/BLUE SHIELD

## 2016-05-28 VITALS — BP 120/81 | HR 69 | Temp 97.1°F | Ht 73.0 in | Wt 202.0 lb

## 2016-05-28 DIAGNOSIS — E349 Endocrine disorder, unspecified: Secondary | ICD-10-CM

## 2016-05-28 DIAGNOSIS — M25512 Pain in left shoulder: Secondary | ICD-10-CM | POA: Diagnosis not present

## 2016-05-28 DIAGNOSIS — N400 Enlarged prostate without lower urinary tract symptoms: Secondary | ICD-10-CM | POA: Diagnosis not present

## 2016-05-28 DIAGNOSIS — E559 Vitamin D deficiency, unspecified: Secondary | ICD-10-CM

## 2016-05-28 DIAGNOSIS — M25511 Pain in right shoulder: Secondary | ICD-10-CM

## 2016-05-28 DIAGNOSIS — E7800 Pure hypercholesterolemia, unspecified: Secondary | ICD-10-CM | POA: Diagnosis not present

## 2016-05-28 LAB — MICROSCOPIC EXAMINATION
Bacteria, UA: NONE SEEN
Epithelial Cells (non renal): NONE SEEN /hpf (ref 0–10)
RBC, UA: NONE SEEN /hpf (ref 0–?)
WBC, UA: NONE SEEN /hpf (ref 0–?)

## 2016-05-28 LAB — URINALYSIS, COMPLETE
Bilirubin, UA: NEGATIVE
Glucose, UA: NEGATIVE
Ketones, UA: NEGATIVE
Leukocytes, UA: NEGATIVE
Nitrite, UA: NEGATIVE
Protein, UA: NEGATIVE
RBC, UA: NEGATIVE
Specific Gravity, UA: 1.015 (ref 1.005–1.030)
Urobilinogen, Ur: 0.2 mg/dL (ref 0.2–1.0)
pH, UA: 6.5 (ref 5.0–7.5)

## 2016-05-28 NOTE — Progress Notes (Signed)
Subjective:    Patient ID: [name], male    DOB: Dec 07, 1950, 65 y.o.   MRN: [id number]  HPI Pt here for follow up and management of chronic medical problems which includes hyperlipidemia. He is taking mediations regularly.She is doing well overall. He has a history of testosterone deficiency and is on testosterone replacement. In August he had his blood work and his testosterone levels were elevated and after that he did reduce his Axiron application and the most recent lab work has testosterone levels that are within normal limits. He does complain of some bilateral shoulder pain but he is not real concerned about this. He has no other complaints. The patient denies any chest pain or shortness of breath. He does have occasional heartburn and we talked about side effects of medicines that could cause this as well as alcohol. He is currently not taking anything for this. He denies any problems with blood in the stool or black tarry bowel movements. He had a negative FOBT back in April. He is passing his water well without problems. He just had an eye exam by Dr. Lynnae Prude. He does have some residual issues with his left hip but he is much more pleased with the final result and not having anything like the pain he had before is replaced. He does have some bilateral shoulder pain and knows that lifting weights can aggravate this.     Patient Active Problem List   Diagnosis Date Noted  . Thrombocytopenia ([place]) 05/23/2016  . Testosterone deficiency 11/25/2012  . 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 Hyperlipidemia 09/25/2011   Outpatient Encounter Prescriptions as of 05/28/2016  Medication Sig  . aspirin 81 MG tablet Take 81 mg by mouth daily.  Hinda Kehr 30 MG/ACT SOLN APPLY 1 PUMP UNDER EACH ARM DAILY  . cholecalciferol (VITAMIN D) 1000 UNITS tablet Take 2,000 Units by mouth 2 (two) times daily.   . fish oil-omega-3 fatty acids 1000 MG capsule Take 2 g by mouth 2 (two) times daily.   . rosuvastatin  (CRESTOR) 10 MG tablet Take 1 tablet (10 mg total) by mouth daily.  . fluticasone (FLONASE) 50 MCG/ACT nasal spray Place 2 sprays into both nostrils daily. (Patient not taking: Reported on 05/28/2016)   No facility-administered encounter medications on file as of 05/28/2016.      Review of Systems  Constitutional: Negative.   HENT: Negative.   Eyes: Negative.   Respiratory: Negative.   Cardiovascular: Negative.   Gastrointestinal: Negative.   Endocrine: Negative.   Genitourinary: Negative.   Musculoskeletal: Positive for arthralgias (bilateral shoulder pain).  Skin: Negative.   Allergic/Immunologic: Negative.   Neurological: Negative.   Hematological: Negative.   Psychiatric/Behavioral: Negative.        Objective:   Physical Exam  Constitutional: He is oriented to person, place, and time. He appears well-developed and well-nourished. No distress.  Pleasant and alert  HENT:  Head: Normocephalic and atraumatic.  Right Ear: External ear normal.  Left Ear: External ear normal.  Nose: Nose normal.  Mouth/Throat: Oropharynx is clear and moist. No oropharyngeal exudate.  Eyes: Conjunctivae and EOM are normal. Pupils are equal, round, and reactive to light. Right eye exhibits no discharge. Left eye exhibits no discharge. No scleral icterus.  Neck: Normal range of motion. Neck supple. No thyromegaly present.  No bruits thyromegaly or anterior cervical adenopathy  Cardiovascular: Normal rate, regular rhythm, normal heart sounds and intact distal pulses.   No murmur heard. The heart has a regular  rate and rhythm at 60/m  Pulmonary/Chest: Effort normal and breath sounds normal. No respiratory distress. He has no wheezes. He has no rales. He exhibits no tenderness.  No axillary adenopathy  Abdominal: Soft. Bowel sounds are normal. He exhibits no mass. There is no tenderness. There is no rebound and no guarding.  No liver or spleen enlargement and no epigastric tenderness and no inguinal  adenopathy  Genitourinary: Rectum normal and penis normal.  Genitourinary Comments: The prostate remains slightly enlarged but soft and smooth. There were no rectal masses. There is no inguinal hernias palpable. The external genitalia were within normal limits.  Musculoskeletal: Normal range of motion. He exhibits no edema.  Lymphadenopathy:    He has no cervical adenopathy.  Neurological: He is alert and oriented to person, place, and time. He has normal reflexes. No cranial nerve deficit.  Skin: Skin is warm and dry. No rash noted.  Psychiatric: He has a normal mood and affect. His behavior is normal. Judgment and thought content normal.  Nursing note and vitals reviewed.  BP 120/81 (BP Location: Left Arm)   Pulse 69   Temp 97.1 F (36.2 C) (Oral)   Ht 6\' 1"  (1.854 m)   Wt 202 lb (91.6 kg)   BMI 26.65 kg/m   An EKG will be done today as well as a chest x-ray and both of these are pending at the time of this note being dictated.       Assessment & Plan:  1. Pure hypercholesterolemia -A strong numbers were excellent and the patient will continue with current treatment - DG Chest 2 View; Future - EKG 12-Lead  2. Testosterone deficiency -Testosterone numbers were within normal limits since the patient has reduced his dose of Axiron and this is what we like to keep the numbers at the patient understands this. He will get numbers repeated again in about 6 months. - Urinalysis, Complete  3. Benign prostatic hyperplasia, unspecified whether lower urinary tract symptoms present -The patient does have BPH the prostate remains smooth without lumps or masses. There are no rectal masses. - Urinalysis, Complete  4. Vitamin D deficiency -Continue with current treatment  5. Bilateral shoulder pain, unspecified chronicity -Let pain be the guide and if pain gets worse he should get back in touch with [place] and we will will make arrangements for an orthopedic visit.  6. GERD -The patient  was asked to limit his alcohol intake and make sure that if he takes Aleve that he takes it after breakfast and supper. He is also good to be asked to take some ranitidine 150 mg twice daily before breakfast and supper and if the reflux continues he'll get back in touch with [place] and we will arrange for him to have an endoscopy. We will also ask him to return another FOBT.  Patient Instructions  Continue current medications. Continue good therapeutic lifestyle changes which include good diet and exercise. Fall precautions discussed with patient. If an FOBT was given today- please return it to our front desk. If you are over 108 years old - you may need Prevnar 63 or the adult Pneumonia vaccine.  **Flu shots are available--- please call and schedule a FLU-CLINIC appointment**  After your visit with [place] today you will receive a survey in the mail or online from [employer] regarding your care with [place]. Please take a moment to fill this out. Your feedback is very important to [place] as you can help [place] better understand your patient needs  as well as improve your experience and satisfaction. WE CARE ABOUT YOU!!!   The patient should take some ranitidine 150 mg, the equate brand from [facility] and take one twice daily before breakfast and supper for at least one month. If he takes any Aleve it should be taken after breakfast and after supper. If he continues to have reflux he should get back in touch with [place] so that we can arrange for him to have an endoscopy. Please return the FOBT Continue with the testosterone replacement as currently doing Stay active physically We will call with the chest x-ray results as soon as it has been reported Please have your ophthalmologist send [place] a copy of the recent eye exam. This winter make sure you use a cool mist  humidifier and avoid the use of overhead fans if possible     Arrie Senate MD

## 2016-05-28 NOTE — Patient Instructions (Addendum)
Continue current medications. Continue good therapeutic lifestyle changes which include good diet and exercise. Fall precautions discussed with patient. If an FOBT was given today- please return it to our front desk. If you are over 64 years old - you may need Prevnar 53 or the adult Pneumonia vaccine.  **Flu shots are available--- please call and schedule a FLU-CLINIC appointment**  After your visit with [place] today you will receive a survey in the mail or online from [employer] regarding your care with [place]. Please take a moment to fill this out. Your feedback is very important to [place] as you can help [place] better understand your patient needs as well as improve your experience and satisfaction. WE CARE ABOUT YOU!!!   The patient should take some ranitidine 150 mg, the equate brand from [facility] and take one twice daily before breakfast and supper for at least one month. If he takes any Aleve it should be taken after breakfast and after supper. If he continues to have reflux he should get back in touch with [place] so that we can arrange for him to have an endoscopy. Please return the FOBT Continue with the testosterone replacement as currently doing Stay active physically We will call with the chest x-ray results as soon as it has been reported Please have your ophthalmologist send [place] a copy of the recent eye exam. This winter make sure you use a cool mist  humidifier and avoid the use of overhead fans if possible

## 2016-06-06 ENCOUNTER — Encounter: Payer: Self-pay | Admitting: *Deleted

## 2016-07-04 ENCOUNTER — Other Ambulatory Visit: Payer: Self-pay | Admitting: Family Medicine

## 2016-07-05 ENCOUNTER — Telehealth: Payer: Self-pay | Admitting: Family Medicine

## 2016-07-05 ENCOUNTER — Other Ambulatory Visit: Payer: Self-pay

## 2016-07-05 MED ORDER — TESTOSTERONE 30 MG/ACT TD SOLN: TRANSDERMAL | 5 refills | Status: DC

## 2016-07-05 NOTE — Telephone Encounter (Signed)
Request sent to MD. Awaiting response

## 2016-09-18 NOTE — Progress Notes (Signed)
No show - encounter opened in error.  Closing as it was been over 1 year since opened.

## 2016-09-19 ENCOUNTER — Encounter: Payer: Self-pay | Admitting: Internal Medicine

## 2016-10-21 ENCOUNTER — Ambulatory Visit (INDEPENDENT_AMBULATORY_CARE_PROVIDER_SITE_OTHER): Payer: BLUE CROSS/BLUE SHIELD | Admitting: *Deleted

## 2016-10-21 DIAGNOSIS — Z23 Encounter for immunization: Secondary | ICD-10-CM | POA: Diagnosis not present

## 2016-10-21 NOTE — Progress Notes (Signed)
Pt given Tdap R deltoid Tolerated well

## 2016-11-06 ENCOUNTER — Encounter: Payer: Self-pay | Admitting: *Deleted

## 2016-11-22 ENCOUNTER — Other Ambulatory Visit: Payer: Self-pay | Admitting: Family Medicine

## 2016-11-22 ENCOUNTER — Ambulatory Visit: Payer: BLUE CROSS/BLUE SHIELD | Admitting: Family Medicine

## 2016-11-27 ENCOUNTER — Ambulatory Visit: Payer: BLUE CROSS/BLUE SHIELD | Admitting: Family Medicine

## 2016-12-02 ENCOUNTER — Other Ambulatory Visit: Payer: BLUE CROSS/BLUE SHIELD

## 2016-12-02 DIAGNOSIS — N400 Enlarged prostate without lower urinary tract symptoms: Secondary | ICD-10-CM

## 2016-12-02 DIAGNOSIS — E559 Vitamin D deficiency, unspecified: Secondary | ICD-10-CM

## 2016-12-02 DIAGNOSIS — Z0000 Encounter for general adult medical examination without abnormal findings: Secondary | ICD-10-CM

## 2016-12-02 DIAGNOSIS — E349 Endocrine disorder, unspecified: Secondary | ICD-10-CM

## 2016-12-02 DIAGNOSIS — E7800 Pure hypercholesterolemia, unspecified: Secondary | ICD-10-CM

## 2016-12-03 ENCOUNTER — Ambulatory Visit: Payer: BLUE CROSS/BLUE SHIELD | Admitting: Family Medicine

## 2016-12-03 LAB — NMR, LIPOPROFILE
Cholesterol: 145 mg/dL (ref 100–199)
HDL Cholesterol by NMR: 52 mg/dL (ref >39)
HDL Particle Number: 29.1 umol/L — ABNORMAL LOW (ref >=30.5)
LDL Particle Number: 937 nmol/L (ref <1000)
LDL Size: 21.1 nm (ref >20.5)
LDL-C: 80 mg/dL (ref 0–99)
LP-IR Score: 26 (ref <=45)
Small LDL Particle Number: 414 nmol/L (ref <=527)
Triglycerides by NMR: 66 mg/dL (ref 0–149)

## 2016-12-04 LAB — CBC WITH DIFFERENTIAL/PLATELET
Basophils Absolute: 0 10*3/uL (ref 0.0–0.2)
Basos: 1 %
EOS (ABSOLUTE): 0.2 10*3/uL (ref 0.0–0.4)
Eos: 5 %
Hematocrit: 43.7 % (ref 37.5–51.0)
Hemoglobin: 15 g/dL (ref 13.0–17.7)
Immature Grans (Abs): 0 10*3/uL (ref 0.0–0.1)
Immature Granulocytes: 0 %
Lymphocytes Absolute: 1.5 10*3/uL (ref 0.7–3.1)
Lymphs: 38 %
MCH: 32.2 pg (ref 26.6–33.0)
MCHC: 34.3 g/dL (ref 31.5–35.7)
MCV: 94 fL (ref 79–97)
Monocytes Absolute: 0.4 10*3/uL (ref 0.1–0.9)
Monocytes: 10 %
Neutrophils Absolute: 1.9 10*3/uL (ref 1.4–7.0)
Neutrophils: 46 %
Platelets: 145 10*3/uL — ABNORMAL LOW (ref 150–379)
RBC: 4.66 x10E6/uL (ref 4.14–5.80)
RDW: 14 % (ref 12.3–15.4)
WBC: 4 10*3/uL (ref 3.4–10.8)

## 2016-12-04 LAB — BMP8+EGFR
BUN/Creatinine Ratio: 16 (ref 10–24)
BUN: 19 mg/dL (ref 8–27)
CO2: 24 mmol/L (ref 18–29)
Calcium: 9.6 mg/dL (ref 8.6–10.2)
Chloride: 100 mmol/L (ref 96–106)
Creatinine, Ser: 1.21 mg/dL (ref 0.76–1.27)
GFR calc Af Amer: 72 mL/min/{1.73_m2} (ref >59)
GFR calc non Af Amer: 62 mL/min/{1.73_m2} (ref >59)
Glucose: 107 mg/dL — ABNORMAL HIGH (ref 65–99)
Potassium: 4.3 mmol/L (ref 3.5–5.2)
Sodium: 140 mmol/L (ref 134–144)

## 2016-12-04 LAB — HEPATIC FUNCTION PANEL
ALT: 28 IU/L (ref 0–44)
AST: 33 IU/L (ref 0–40)
Albumin: 4.5 g/dL (ref 3.6–4.8)
Alkaline Phosphatase: 61 IU/L (ref 39–117)
Bilirubin Total: 0.7 mg/dL (ref 0.0–1.2)
Bilirubin, Direct: 0.19 mg/dL (ref 0.00–0.40)
Total Protein: 6.7 g/dL (ref 6.0–8.5)

## 2016-12-04 LAB — PSA, TOTAL AND FREE
PSA, Free Pct: 19.2 %
PSA, Free: 0.23 ng/mL
Prostate Specific Ag, Serum: 1.2 ng/mL (ref 0.0–4.0)

## 2016-12-04 LAB — VITAMIN D 25 HYDROXY (VIT D DEFICIENCY, FRACTURES): Vit D, 25-Hydroxy: 53.4 ng/mL (ref 30.0–100.0)

## 2016-12-04 LAB — TESTOSTERONE,FREE AND TOTAL
Testosterone, Free: 12 pg/mL (ref 6.6–18.1)
Testosterone: 465 ng/dL (ref 264–916)

## 2016-12-06 ENCOUNTER — Ambulatory Visit: Payer: BLUE CROSS/BLUE SHIELD | Admitting: Family Medicine

## 2016-12-19 ENCOUNTER — Ambulatory Visit (INDEPENDENT_AMBULATORY_CARE_PROVIDER_SITE_OTHER): Payer: BLUE CROSS/BLUE SHIELD | Admitting: Family Medicine

## 2016-12-19 ENCOUNTER — Encounter: Payer: Self-pay | Admitting: Family Medicine

## 2016-12-19 VITALS — BP 103/66 | HR 65 | Temp 97.2°F | Ht 73.0 in | Wt 196.0 lb

## 2016-12-19 DIAGNOSIS — Z0000 Encounter for general adult medical examination without abnormal findings: Secondary | ICD-10-CM | POA: Diagnosis not present

## 2016-12-19 DIAGNOSIS — Z1211 Encounter for screening for malignant neoplasm of colon: Secondary | ICD-10-CM

## 2016-12-19 DIAGNOSIS — Z9889 Other specified postprocedural states: Secondary | ICD-10-CM

## 2016-12-19 DIAGNOSIS — E349 Endocrine disorder, unspecified: Secondary | ICD-10-CM

## 2016-12-19 DIAGNOSIS — Z23 Encounter for immunization: Secondary | ICD-10-CM

## 2016-12-19 DIAGNOSIS — E559 Vitamin D deficiency, unspecified: Secondary | ICD-10-CM

## 2016-12-19 DIAGNOSIS — E7800 Pure hypercholesterolemia, unspecified: Secondary | ICD-10-CM

## 2016-12-19 DIAGNOSIS — Z8051 Family history of malignant neoplasm of kidney: Secondary | ICD-10-CM

## 2016-12-19 DIAGNOSIS — N400 Enlarged prostate without lower urinary tract symptoms: Secondary | ICD-10-CM

## 2016-12-19 NOTE — Patient Instructions (Addendum)
Continue current medications. Continue good therapeutic lifestyle changes which include good diet and exercise. Fall precautions discussed with patient. If an FOBT was given today- please return it to our front desk. If you are over 66 years old - you may need Prevnar 45 or the adult Pneumonia vaccine.  **Flu shots are available--- please call and schedule a FLU-CLINIC appointment**  After your visit with [place] today you will receive a survey in the mail or online from [employer] regarding your care with [place]. Please take a moment to fill this out. Your feedback is very important to [place] as you can help [place] better understand your patient needs as well as improve your experience and satisfaction. WE CARE ABOUT YOU!!!   At some point in the future we will want to get another abdominal ultrasound because of the family history of renal cell carcinoma We will arrange for you to have a colonoscopy with Dr. Elmo Putt Continue with testosterone is currently doing Follow-up with orthopedic surgery and rehabilitation as planned Hepatitis C panel at next blood work Increase activity as per orthopedic You will receive the Prevnar vaccine today which is the childhood pneumonia shot and in one year you will get an adult pneumonia shot or the Pneumovax. Please check with your insurance regarding the new shingles shot.

## 2016-12-19 NOTE — Progress Notes (Signed)
Subjective:    Patient ID: [name], male    DOB: [birth year], 66 y.o.   MRN: [id number]  HPI Patient is here today for annual wellness exam and follow up of chronic medical problems which includes hyperlipidemia. He is taking medication regularly.The patient is doing well overall. He had recent right shoulder surgery. He is on testosterone replacement and is seen every 6 months with testosterone levels and PSA levels for review. We will go over his lab work during the visit today. The PSA remains low and stable at 1.2. The blood sugar slightly increased at 107. The creatinine, the most important kidney function test was within normal limits. All electrolytes including potassium are good. The CBC had a normal white blood cell count with a good hemoglobin and stable at 15.0. The platelet count remains slightly decreased but stable from past readings. At 145,000. All liver function tests were normal. The vitamin D level was excellent at 53.4. Cholesterol numbers with advanced lipid testing have increased slightly but are still within normal limits with a total LDL particle number being 937 and LDL C being 80. Triglycerides are good at 66. The HDL particle number was slightly decreased this time. The testosterone levels were within normal limits for the total and free direct testosterone. The patient's surgery was for a detached rotator cuff and this surgery was done by Dr. supple and this was about 6 weeks ago. He is doing well and last saw the surgeon yesterday. He is beginning rehabilitation therapy now. He is due to get his colonoscopy and we will arrange for him to have this as soon as possible. He denies any chest pain or shortness of breath. He does have occasional heartburn and indigestion but not often and only takes when necessary medicines for this. He denies any blood in the stool or black tarry bowel movements. He's passing his water well without problems and has no sexual dysfunction. He  recently had his eyes examined and everything was stable with them. The family history is positive and his mother for renal cell carcinoma and Alzheimer's in his dad.    Patient Active Problem List   Diagnosis Date Noted  . Thrombocytopenia ([place]) 05/23/2016  . Testosterone deficiency 11/25/2012  . 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 Hyperlipidemia 09/25/2011   Outpatient Encounter Prescriptions as of 12/19/2016  Medication Sig  . aspirin 81 MG tablet Take 81 mg by mouth daily.  . cholecalciferol (VITAMIN D) 1000 UNITS tablet Take 2,000 Units by mouth 2 (two) times daily.   . fish oil-omega-3 fatty acids 1000 MG capsule Take 2 g by mouth 2 (two) times daily.   . fluticasone (FLONASE) 50 MCG/ACT nasal spray Place 2 sprays into both nostrils daily.  . rosuvastatin (CRESTOR) 10 MG tablet TAKE 1 TABLET DAILY  . Testosterone (AXIRON) 30 MG/ACT SOLN APPLY 1 PUMP UNDER EACH ARM DAILY   No facility-administered encounter medications on file as of 12/19/2016.       Review of Systems  Constitutional: Negative.   HENT: Negative.   Eyes: Negative.   Respiratory: Negative.   Cardiovascular: Negative.   Gastrointestinal: Negative.   Endocrine: Negative.   Genitourinary: Negative.   Musculoskeletal: Negative.   Skin: Negative.   Allergic/Immunologic: Negative.   Neurological: Negative.   Hematological: Negative.   Psychiatric/Behavioral: Negative.        Objective:   Physical Exam  Constitutional: He is oriented to person, place, and time. He appears well-developed and well-nourished. No distress.  The patient is pleasant and alert.  HENT:  Head: Normocephalic and atraumatic.  Right Ear: External ear normal.  Left Ear: External ear normal.  Mouth/Throat: Oropharynx is clear and moist. No oropharyngeal exudate.  Slight nasal congestion  Eyes: Conjunctivae and EOM are normal. Pupils are equal, round, and reactive to light. Right eye exhibits no discharge. Left eye exhibits no discharge. No  scleral icterus.  Recent eye exam by Dr. Carolynn Sayers was normal  Neck: Normal range of motion. Neck supple. No thyromegaly present.  No bruits thyromegaly or anterior cervical adenopathy  Cardiovascular: Normal rate, regular rhythm, normal heart sounds and intact distal pulses.   No murmur heard. The heart is regular at 60/m  Pulmonary/Chest: Effort normal and breath sounds normal. No respiratory distress. He has no wheezes. He has no rales. He exhibits no tenderness.  Clear anteriorly and posteriorly and no axillary adenopathy  Abdominal: Soft. Bowel sounds are normal. He exhibits no mass. There is no tenderness. There is no rebound and no guarding.  No abdominal tenderness liver or spleen enlargement or inguinal adenopathy or suprapubic tenderness or masses  Genitourinary: Rectum normal and penis normal.  Genitourinary Comments: The prostate is minimally enlarged but soft and smooth. There are no rectal masses. There are no inguinal hernias and the external genitalia were within normal limits.  Musculoskeletal: He exhibits no edema.  Limited motion of right shoulder secondary to recent surgery. Rehabilitation therapy is proceeding.  Lymphadenopathy:    He has no cervical adenopathy.  Neurological: He is alert and oriented to person, place, and time. He has normal reflexes. No cranial nerve deficit.  Skin: Skin is warm and dry. No rash noted.  Psychiatric: He has a normal mood and affect. His behavior is normal. Judgment and thought content normal.  Nursing note and vitals reviewed.  BP 103/66 (BP Location: Left Arm)   Pulse 65   Temp 97.2 F (36.2 C) (Oral)   Ht 6\' 1"  (1.854 m)   Wt 196 lb (88.9 kg)   BMI 25.86 kg/m         Assessment & Plan:  1. Annual physical exam -The patient is doing well overall. He is due for his colonoscopy. At his next visit we will get a hepatitis C screen. He will also get the Prevnar vaccine today and the Pneumovax in 1 year. He will check with his  insurance regarding the new shingles shot. - Urinalysis, Complete  2. Pure hypercholesterolemia -Continue with current treatment and aggressive therapeutic lifestyle changes  3. Testosterone deficiency -Continue with current treatment regimen - Urinalysis, Complete  4. Benign prostatic hyperplasia, unspecified whether lower urinary tract symptoms present -No abnormalities with prostate exam and no sexual dysfunction  5. Vitamin D deficiency -Continue current treatment  6. Screen for colon cancer -The last colonoscopy was 10 years ago and we will arrange for him to see the gastroenterologist and have a repeat colonoscopy. - Ambulatory referral to Gastroenterology  7. Family history of renal cell carcinoma -At some point in the future we will arrange for a another ultrasound of the abdomen.  8. Status post rotator cuff surgery -Follow-up with orthopedist as planned  Patient Instructions  Continue current medications. Continue good therapeutic lifestyle changes which include good diet and exercise. Fall precautions discussed with patient. If an FOBT was given today- please return it to our front desk. If you are over 78 years old - you may need Prevnar 80 or the adult Pneumonia vaccine.  **Flu shots are available--- please  call and schedule a FLU-CLINIC appointment**  After your visit with [place] today you will receive a survey in the mail or online from [employer] regarding your care with [place]. Please take a moment to fill this out. Your feedback is very important to [place] as you can help [place] better understand your patient needs as well as improve your experience and satisfaction. WE CARE ABOUT YOU!!!   At some point in the future we will want to get another abdominal ultrasound because of the family history of renal cell carcinoma We will arrange for you to have a colonoscopy with Dr. Elmo Putt Continue with testosterone is currently doing Follow-up with orthopedic surgery and  rehabilitation as planned Hepatitis C panel at next blood work Increase activity as per orthopedic You will receive the Prevnar vaccine today which is the childhood pneumonia shot and in one year you will get an adult pneumonia shot or the Pneumovax. Please check with your insurance regarding the new shingles shot.  Arrie Senate MD

## 2016-12-19 NOTE — Addendum Note (Signed)
Addended by: Zannie Cove on: 12/19/2016 03:28 PM   Modules accepted: Orders

## 2017-01-06 ENCOUNTER — Other Ambulatory Visit: Payer: Self-pay | Admitting: Family Medicine

## 2017-01-07 MED ORDER — ROSUVASTATIN CALCIUM 10 MG PO TABS: 10.0000 mg | ORAL_TABLET | Freq: Every day | ORAL | 5 refills | Status: DC

## 2017-01-07 MED ORDER — TESTOSTERONE 30 MG/ACT TD SOLN: TRANSDERMAL | 5 refills | Status: DC

## 2017-01-28 ENCOUNTER — Encounter: Payer: Self-pay | Admitting: Family Medicine

## 2017-05-22 ENCOUNTER — Telehealth: Payer: Self-pay | Admitting: Family Medicine

## 2017-06-06 ENCOUNTER — Telehealth: Payer: Self-pay

## 2017-06-06 NOTE — Telephone Encounter (Signed)
Insurance approved appeal for Testosterone

## 2017-06-06 NOTE — Telephone Encounter (Signed)
Please let patient know thank you

## 2017-06-06 NOTE — Telephone Encounter (Signed)
done

## 2017-06-20 ENCOUNTER — Other Ambulatory Visit: Payer: BLUE CROSS/BLUE SHIELD

## 2017-06-20 DIAGNOSIS — E7800 Pure hypercholesterolemia, unspecified: Secondary | ICD-10-CM

## 2017-06-20 DIAGNOSIS — E559 Vitamin D deficiency, unspecified: Secondary | ICD-10-CM

## 2017-06-20 DIAGNOSIS — E349 Endocrine disorder, unspecified: Secondary | ICD-10-CM

## 2017-06-20 DIAGNOSIS — N400 Enlarged prostate without lower urinary tract symptoms: Secondary | ICD-10-CM | POA: Diagnosis not present

## 2017-06-21 LAB — HEPATIC FUNCTION PANEL
ALT: 29 IU/L (ref 0–44)
AST: 29 IU/L (ref 0–40)
Albumin: 4.4 g/dL (ref 3.6–4.8)
Alkaline Phosphatase: 58 IU/L (ref 39–117)
Bilirubin Total: 0.8 mg/dL (ref 0.0–1.2)
Bilirubin, Direct: 0.24 mg/dL (ref 0.00–0.40)
Total Protein: 6.7 g/dL (ref 6.0–8.5)

## 2017-06-21 LAB — PSA, TOTAL AND FREE
PSA, Free Pct: 18.2 %
PSA, Free: 0.2 ng/mL
Prostate Specific Ag, Serum: 1.1 ng/mL (ref 0.0–4.0)

## 2017-06-21 LAB — CBC WITH DIFFERENTIAL/PLATELET
Basophils Absolute: 0 10*3/uL (ref 0.0–0.2)
Basos: 0 %
EOS (ABSOLUTE): 0.1 10*3/uL (ref 0.0–0.4)
Eos: 3 %
Hematocrit: 45.5 % (ref 37.5–51.0)
Hemoglobin: 15.3 g/dL (ref 13.0–17.7)
Immature Grans (Abs): 0 10*3/uL (ref 0.0–0.1)
Immature Granulocytes: 0 %
Lymphocytes Absolute: 1.1 10*3/uL (ref 0.7–3.1)
Lymphs: 22 %
MCH: 31.9 pg (ref 26.6–33.0)
MCHC: 33.6 g/dL (ref 31.5–35.7)
MCV: 95 fL (ref 79–97)
Monocytes Absolute: 0.4 10*3/uL (ref 0.1–0.9)
Monocytes: 8 %
Neutrophils Absolute: 3.5 10*3/uL (ref 1.4–7.0)
Neutrophils: 67 %
Platelets: 157 10*3/uL (ref 150–379)
RBC: 4.8 x10E6/uL (ref 4.14–5.80)
RDW: 13 % (ref 12.3–15.4)
WBC: 5.1 10*3/uL (ref 3.4–10.8)

## 2017-06-21 LAB — BMP8+EGFR
BUN/Creatinine Ratio: 16 (ref 10–24)
BUN: 17 mg/dL (ref 8–27)
CO2: 21 mmol/L (ref 20–29)
Calcium: 9 mg/dL (ref 8.6–10.2)
Chloride: 102 mmol/L (ref 96–106)
Creatinine, Ser: 1.08 mg/dL (ref 0.76–1.27)
GFR calc Af Amer: 82 mL/min/{1.73_m2} (ref >59)
GFR calc non Af Amer: 71 mL/min/{1.73_m2} (ref >59)
Glucose: 100 mg/dL — ABNORMAL HIGH (ref 65–99)
Potassium: 4.4 mmol/L (ref 3.5–5.2)
Sodium: 141 mmol/L (ref 134–144)

## 2017-06-21 LAB — NMR, LIPOPROFILE
Cholesterol: 150 mg/dL (ref 100–199)
HDL Cholesterol by NMR: 52 mg/dL (ref >39)
HDL Particle Number: 29.1 umol/L — ABNORMAL LOW (ref >=30.5)
LDL Particle Number: 930 nmol/L (ref <1000)
LDL Size: 20.7 nm (ref >20.5)
LDL-C: 83 mg/dL (ref 0–99)
LP-IR Score: <25 (ref <=45)
Small LDL Particle Number: 383 nmol/L (ref <=527)
Triglycerides by NMR: 75 mg/dL (ref 0–149)

## 2017-06-21 LAB — TESTOSTERONE,FREE AND TOTAL
Testosterone, Free: 8.2 pg/mL (ref 6.6–18.1)
Testosterone: 246 ng/dL — ABNORMAL LOW (ref 264–916)

## 2017-06-21 LAB — VITAMIN D 25 HYDROXY (VIT D DEFICIENCY, FRACTURES): Vit D, 25-Hydroxy: 70.2 ng/mL (ref 30.0–100.0)

## 2017-06-23 ENCOUNTER — Encounter: Payer: Self-pay | Admitting: Family Medicine

## 2017-06-23 ENCOUNTER — Ambulatory Visit (INDEPENDENT_AMBULATORY_CARE_PROVIDER_SITE_OTHER): Payer: Medicare Other | Admitting: Family Medicine

## 2017-06-23 VITALS — BP 114/72 | HR 57 | Temp 96.8°F | Ht 73.0 in | Wt 196.0 lb

## 2017-06-23 DIAGNOSIS — E349 Endocrine disorder, unspecified: Secondary | ICD-10-CM | POA: Diagnosis not present

## 2017-06-23 DIAGNOSIS — E7800 Pure hypercholesterolemia, unspecified: Secondary | ICD-10-CM

## 2017-06-23 DIAGNOSIS — N400 Enlarged prostate without lower urinary tract symptoms: Secondary | ICD-10-CM

## 2017-06-23 DIAGNOSIS — D696 Thrombocytopenia, unspecified: Secondary | ICD-10-CM

## 2017-06-23 DIAGNOSIS — E559 Vitamin D deficiency, unspecified: Secondary | ICD-10-CM | POA: Diagnosis not present

## 2017-06-23 MED ORDER — ROSUVASTATIN CALCIUM 10 MG PO TABS: 10.0000 mg | ORAL_TABLET | Freq: Every day | ORAL | 3 refills | Status: DC

## 2017-06-23 MED ORDER — TESTOSTERONE 30 MG/ACT TD SOLN: TRANSDERMAL | 5 refills | Status: DC

## 2017-06-23 NOTE — Addendum Note (Signed)
Addended by: Zannie Cove on: 06/23/2017 08:55 AM   Modules accepted: Orders

## 2017-06-23 NOTE — Progress Notes (Signed)
Subjective:    Patient ID: [name], male    DOB: [birth year], 66 y.o.   MRN: [id number]  HPI Pt here for follow up and management of chronic medical problems which includes hyperlipidemia. He is taking medication regularly.  The patient is in today and we will review his lab work which is already been done.  All liver function tests were good.  The PSA remains stable at 1.1.  The free direct testosterone level was within normal limits and the total testosterone level was decreased at 246.  Cholesterol numbers had an LDL C that was in the HDL particle number was 29.1 which is low and was consistent with past readings.  The total LDL particle number was 930 and was good and at goal.  The vitamin D level was excellent at 70.2 the CBC was within normal limits.  The blood sugar was slightly elevated at 100 and the creatinine all of the electrolytes were normal.  The patient is doing well overall.  He retired in August and he plans to continue to live in the community as his family is here.  He denies any chest pain or shortness of breath.  He denies any trouble with nausea vomiting diarrhea blood in the stool or black tarry bowel movements.  He does have heartburn and only takes the ranitidine occasionally.  He is also past due on his colonoscopy and plans to call the gastroenterologist to set this up so he can follow through on this test.  He sees Dr. Carlean Purl.  He is passing his water well without problems.  His sexual function is good but not as good as he would like for it to be and he thinks some of this may be age-related.  His total testosterone levels were low and we upped the use of the pump with testosterone replacement slightly to see if we can get these numbers up some more.     Patient Active Problem List   Diagnosis Date Noted  . Thrombocytopenia ([place]) 05/23/2016  . Testosterone deficiency 11/25/2012  . 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 Hyperlipidemia 09/25/2011   Outpatient Encounter  Medications as of 06/23/2017  Medication Sig  . aspirin 81 MG tablet Take 81 mg by mouth daily.  . cholecalciferol (VITAMIN D) 1000 UNITS tablet Take 2,000 Units by mouth 2 (two) times daily.   . fish oil-omega-3 fatty acids 1000 MG capsule Take 2 g by mouth 2 (two) times daily.   . fluticasone (FLONASE) 50 MCG/ACT nasal spray Place 2 sprays into both nostrils daily.  . rosuvastatin (CRESTOR) 10 MG tablet Take 1 tablet (10 mg total) by mouth daily.  . Testosterone 30 MG/ACT SOLN APPLY 1 PUMP UNDER EACH ARM DAILY   No facility-administered encounter medications on file as of 06/23/2017.      Review of Systems  Constitutional: Negative.   HENT: Negative.   Eyes: Negative.   Respiratory: Negative.   Cardiovascular: Negative.   Gastrointestinal: Negative.        Heartburn at night   Endocrine: Negative.   Genitourinary: Negative.   Musculoskeletal: Negative.   Skin: Negative.   Allergic/Immunologic: Negative.   Neurological: Negative.   Hematological: Negative.   Psychiatric/Behavioral: Negative.        Objective:   Physical Exam  Constitutional: He is oriented to person, place, and time. He appears well-developed and well-nourished. No distress.  Patient is doing well and is calm pleasant and relaxed  HENT:  Head: Normocephalic and atraumatic.  Right Ear: External ear normal.  Left Ear: External ear normal.  Nose: Nose normal.  Mouth/Throat: Oropharynx is clear and moist. No oropharyngeal exudate.  Eyes: Conjunctivae and EOM are normal. Pupils are equal, round, and reactive to light. Right eye exhibits no discharge. Left eye exhibits no discharge. No scleral icterus.  He gets his eyes checked regularly by Dr. Earl Gala  Neck: Normal range of motion. Neck supple. No thyromegaly present.  No bruits thyromegaly or anterior cervical adenopathy  Cardiovascular: Normal rate, regular rhythm, normal heart sounds and intact distal pulses.  No murmur heard. Heart is regular at 60/min    Pulmonary/Chest: Effort normal and breath sounds normal. No respiratory distress. He has no wheezes. He has no rales. He exhibits no tenderness.  No axillary adenopathy chest wall masses with good lung sounds anteriorly and posteriorly  Abdominal: Soft. Bowel sounds are normal. He exhibits no mass. There is no tenderness. There is no rebound and no guarding.  No abdominal tenderness masses organ enlargement or bruits  Genitourinary: Rectum normal and penis normal.  Genitourinary Comments: The prostate is only slightly enlarged with no lumps or masses.  There were no rectal masses.  External genitalia were within normal limits with no hernias being palpated and no inguinal adenopathy  Musculoskeletal: Normal range of motion. He exhibits no edema.  Lymphadenopathy:    He has no cervical adenopathy.  Neurological: He is alert and oriented to person, place, and time. He has normal reflexes. No cranial nerve deficit.  Skin: Skin is warm and dry. No rash noted.  Psychiatric: He has a normal mood and affect. His behavior is normal. Judgment and thought content normal.  Nursing note and vitals reviewed.   BP 114/72 (BP Location: Left Arm)   Pulse (!) 57   Temp (!) 96.8 F (36 C) (Oral)   Ht 6\' 1"  (1.854 m)   Wt 196 lb (88.9 kg)   BMI 25.86 kg/m       Assessment & Plan:  1. Pure hypercholesterolemia -Cholesterol numbers with advanced lipid testing were good and the patient will continue with aggressive therapeutic lifestyle changes and current cholesterol treatment  2. Testosterone deficiency -The total testosterone level was slightly decreased this time and he will change his testosterone replacement to 1-1/2 pumps on Monday Wednesday Friday and 2 pumps the other days of the week.  3. Thrombocytopenia (HCC) -Platelet count was good at this time.  4. Benign prostatic hyperplasia, unspecified whether lower urinary tract symptoms present -Continue current testosterone replacement as  indicated.  5. Vitamin D deficiency -Continue with vitamin D replacement  No orders of the defined types were placed in this encounter.  Patient Instructions  Continue current medications. Continue good therapeutic lifestyle changes which include good diet and exercise. Fall precautions discussed with patient. If an FOBT was given today- please return it to our front desk. If you are over 16 years old - you may need Prevnar 31 or the adult Pneumonia vaccine.  **Flu shots are available--- please call and schedule a FLU-CLINIC appointment**  After your visit with [place] today you will receive a survey in the mail or online from [employer] regarding your care with [place]. Please take a moment to fill this out. Your feedback is very important to [place] as you can help [place] better understand your patient needs as well as improve your experience and satisfaction. WE CARE ABOUT YOU!!!   The patient will call the gastroenterologist office and arrange to have his colonoscopy done He  should take the ranitidine or Zantac 150 mg, the equate brand and take 1 twice a day at least until the new year starts.  This should be taken before breakfast and supper and then after the new year start and take it twice a day as needed This winter continue to exercise and drink plenty of fluids and stay well-hydrated Increase the use of the testosterone and to 1-1/2 pumps on Monday Wednesday and Friday and 2 pumps all other days.  We will recheck levels at the next 22-month visit.  Arrie Senate MD

## 2017-06-23 NOTE — Patient Instructions (Addendum)
Continue current medications. Continue good therapeutic lifestyle changes which include good diet and exercise. Fall precautions discussed with patient. If an FOBT was given today- please return it to our front desk. If you are over 66 years old - you may need Prevnar 45 or the adult Pneumonia vaccine.  **Flu shots are available--- please call and schedule a FLU-CLINIC appointment**  After your visit with [place] today you will receive a survey in the mail or online from [employer] regarding your care with [place]. Please take a moment to fill this out. Your feedback is very important to [place] as you can help [place] better understand your patient needs as well as improve your experience and satisfaction. WE CARE ABOUT YOU!!!   The patient will call the gastroenterologist office and arrange to have his colonoscopy done He should take the ranitidine or Zantac 150 mg, the equate brand and take 1 twice a day at least until the new year starts.  This should be taken before breakfast and supper and then after the new year start and take it twice a day as needed This winter continue to exercise and drink plenty of fluids and stay well-hydrated Increase the use of the testosterone and to 1-1/2 pumps on Monday Wednesday and Friday and 2 pumps all other days.  We will recheck levels at the next 38-month visit.

## 2017-08-22 ENCOUNTER — Telehealth: Payer: Self-pay | Admitting: Family Medicine

## 2017-08-22 NOTE — Telephone Encounter (Signed)
Please review and advise on testosterone inj Pt sees Dr Leroy Kennedy for colonoscopy

## 2017-08-23 NOTE — Telephone Encounter (Signed)
Please have patient come in and train to give testosterone IM and educate regarding peaks and valleys with testosterone.  Get a baseline testosterone level and start with 200 mg monthly IM and repeat testosterone level in 6 weeks if he is seeing Dr. Carlean Purl previously please make sure that he sees him again for his colonoscopy.[name]

## 2017-08-25 NOTE — Telephone Encounter (Signed)
Aware of GI - he will call today and set this appt up  He is wanted to do one more RX of the GEL and then he will call us to initiate the shots.

## 2017-08-29 ENCOUNTER — Encounter: Payer: Self-pay | Admitting: Internal Medicine

## 2017-09-04 DIAGNOSIS — E291 Testicular hypofunction: Secondary | ICD-10-CM | POA: Diagnosis not present

## 2017-09-05 DIAGNOSIS — R6882 Decreased libido: Secondary | ICD-10-CM | POA: Diagnosis not present

## 2017-09-05 DIAGNOSIS — R5383 Other fatigue: Secondary | ICD-10-CM | POA: Diagnosis not present

## 2017-09-05 DIAGNOSIS — E291 Testicular hypofunction: Secondary | ICD-10-CM | POA: Diagnosis not present

## 2017-09-05 DIAGNOSIS — M2550 Pain in unspecified joint: Secondary | ICD-10-CM | POA: Diagnosis not present

## 2017-09-26 DIAGNOSIS — M25511 Pain in right shoulder: Secondary | ICD-10-CM | POA: Diagnosis not present

## 2017-09-26 DIAGNOSIS — M25512 Pain in left shoulder: Secondary | ICD-10-CM | POA: Diagnosis not present

## 2017-10-06 DIAGNOSIS — M7542 Impingement syndrome of left shoulder: Secondary | ICD-10-CM | POA: Diagnosis not present

## 2017-10-06 DIAGNOSIS — M25511 Pain in right shoulder: Secondary | ICD-10-CM | POA: Diagnosis not present

## 2017-10-06 DIAGNOSIS — R6882 Decreased libido: Secondary | ICD-10-CM | POA: Diagnosis not present

## 2017-10-06 DIAGNOSIS — R5383 Other fatigue: Secondary | ICD-10-CM | POA: Diagnosis not present

## 2017-10-06 DIAGNOSIS — E291 Testicular hypofunction: Secondary | ICD-10-CM | POA: Diagnosis not present

## 2017-10-06 DIAGNOSIS — M25512 Pain in left shoulder: Secondary | ICD-10-CM | POA: Diagnosis not present

## 2017-10-06 DIAGNOSIS — M2550 Pain in unspecified joint: Secondary | ICD-10-CM | POA: Diagnosis not present

## 2017-10-07 DIAGNOSIS — E291 Testicular hypofunction: Secondary | ICD-10-CM | POA: Diagnosis not present

## 2017-10-07 DIAGNOSIS — M2550 Pain in unspecified joint: Secondary | ICD-10-CM | POA: Diagnosis not present

## 2017-10-07 DIAGNOSIS — R5383 Other fatigue: Secondary | ICD-10-CM | POA: Diagnosis not present

## 2017-10-07 DIAGNOSIS — R6882 Decreased libido: Secondary | ICD-10-CM | POA: Diagnosis not present

## 2017-10-07 DIAGNOSIS — R52 Pain, unspecified: Secondary | ICD-10-CM | POA: Insufficient documentation

## 2017-10-07 DIAGNOSIS — M25512 Pain in left shoulder: Secondary | ICD-10-CM | POA: Diagnosis not present

## 2017-10-07 DIAGNOSIS — M25511 Pain in right shoulder: Secondary | ICD-10-CM | POA: Diagnosis not present

## 2017-10-09 DIAGNOSIS — M25511 Pain in right shoulder: Secondary | ICD-10-CM | POA: Diagnosis not present

## 2017-10-09 DIAGNOSIS — S46002D Unspecified injury of muscle(s) and tendon(s) of the rotator cuff of left shoulder, subsequent encounter: Secondary | ICD-10-CM | POA: Diagnosis not present

## 2017-10-09 DIAGNOSIS — M25512 Pain in left shoulder: Secondary | ICD-10-CM | POA: Diagnosis not present

## 2017-10-09 DIAGNOSIS — S46011D Strain of muscle(s) and tendon(s) of the rotator cuff of right shoulder, subsequent encounter: Secondary | ICD-10-CM | POA: Diagnosis not present

## 2017-10-22 ENCOUNTER — Ambulatory Visit (AMBULATORY_SURGERY_CENTER): Payer: Self-pay | Admitting: *Deleted

## 2017-10-22 ENCOUNTER — Other Ambulatory Visit: Payer: Self-pay

## 2017-10-22 VITALS — Ht 73.0 in | Wt 199.0 lb

## 2017-10-22 DIAGNOSIS — Z1211 Encounter for screening for malignant neoplasm of colon: Secondary | ICD-10-CM

## 2017-10-22 NOTE — Progress Notes (Signed)
Patient denies any allergies to eggs or soy. Patient denies any problems with anesthesia/sedation. Patient denies any oxygen use at home. Patient denies taking any diet/weight loss medications or blood thinners. EMMI education declined by pt.  

## 2017-11-05 ENCOUNTER — Other Ambulatory Visit: Payer: Self-pay

## 2017-11-05 ENCOUNTER — Ambulatory Visit (AMBULATORY_SURGERY_CENTER): Payer: Medicare Other | Admitting: Internal Medicine

## 2017-11-05 ENCOUNTER — Encounter: Payer: Self-pay | Admitting: Internal Medicine

## 2017-11-05 VITALS — BP 120/79 | HR 51 | Temp 98.2°F | Resp 11 | Ht 73.0 in | Wt 199.0 lb

## 2017-11-05 DIAGNOSIS — Z1212 Encounter for screening for malignant neoplasm of rectum: Secondary | ICD-10-CM | POA: Diagnosis not present

## 2017-11-05 DIAGNOSIS — Z1211 Encounter for screening for malignant neoplasm of colon: Secondary | ICD-10-CM

## 2017-11-05 MED ORDER — SODIUM CHLORIDE 0.9 % IV SOLN: 500.0000 mL | Freq: Once | INTRAVENOUS | Status: DC

## 2017-11-05 NOTE — Progress Notes (Signed)
To recovery, report to RN, VSS. 

## 2017-11-05 NOTE — Progress Notes (Signed)
Pt had  Total left hip replacement. Has metal in left hip. maw

## 2017-11-05 NOTE — Progress Notes (Signed)
Pt's states no medical or surgical changes since previsit or office visit. 

## 2017-11-05 NOTE — Op Note (Signed)
[facility] Patient Name: [name] Procedure Date: 11/05/2017 9:51 AM MRN: [id number] Endoscopist: Gatha Mayer , MD Age: 67 Referring MD:  Date of Birth: 21-Dec-1950 Gender: Male Account #: [id number] Procedure:                Colonoscopy Indications:              Screening for colorectal malignant neoplasm Last                            colonoscopy 2008 Medicines:                Propofol per Anesthesia, Monitored Anesthesia Care Procedure:                Pre-Anesthesia Assessment:                           - Prior to the procedure, a History and Physical                            was performed, and patient medications and                            allergies were reviewed. The patient's tolerance of                            previous anesthesia was also reviewed. The risks                            and benefits of the procedure and the sedation                            options and risks were discussed with the patient.                            All questions were answered, and informed consent                            was obtained. Prior Anticoagulants: The patient has                            taken no previous anticoagulant or antiplatelet                            agents. ASA Grade Assessment: II - A patient with                            mild systemic disease. After reviewing the risks                            and benefits, the patient was deemed in                            satisfactory condition to undergo the procedure.  After obtaining informed consent, the colonoscope                            was passed under direct vision. Throughout the                            procedure, the patient's blood pressure, pulse, and                            oxygen saturations were monitored continuously. The                            Colonoscope was introduced through the anus and                            advanced to the the  cecum, identified by                            appendiceal orifice and ileocecal valve. The                            ileocecal valve, appendiceal orifice, and rectum                            were photographed. The quality of the bowel                            preparation was good. The bowel preparation used                            was Miralax. Scope In: 9:56:53 AM Scope Out: 10:13:43 AM Scope Withdrawal Time: 0 hours 12 minutes 6 seconds  Total Procedure Duration: 0 hours 16 minutes 50 seconds  Findings:                 The perianal and digital rectal examinations were                            normal. Pertinent negatives include normal prostate                            (size, shape, and consistency).                           Multiple diverticula were found in the sigmoid                            colon. There was no evidence of diverticular                            bleeding.                           The exam was otherwise without abnormality on  direct and retroflexion views. Complications:            No immediate complications. Estimated blood loss:                            None. Estimated Blood Loss:     Estimated blood loss: none. Recommendation:           - Repeat colonoscopy or other appropriate test for                            screening not before but in 10 years..                           - Resume previous diet.                           - Continue present medications. Gatha Mayer, MD 11/05/2017 10:19:07 AM This report has been signed electronically.

## 2017-11-05 NOTE — Patient Instructions (Addendum)
No polyps or cancer seen.  You do have diverticulosis - thickened muscle rings and pouches in the colon wall. Please read the handout about this condition.  Next routine colonoscopy or other screening test in 10 years - 2029  I appreciate the opportunity to care for you. Gatha Mayer, MD, FACG YOU HAD AN ENDOSCOPIC PROCEDURE TODAY AT [facility]:   Refer to the procedure report that was given to you for any specific questions about what was found during the examination.  If the procedure report does not answer your questions, please call your gastroenterologist to clarify.  If you requested that your care partner not be given the details of your procedure findings, then the procedure report has been included in a sealed envelope for you to review at your convenience later.  YOU SHOULD EXPECT: Some feelings of bloating in the abdomen. Passage of more gas than usual.  Walking can help get rid of the air that was put into your GI tract during the procedure and reduce the bloating. If you had a lower endoscopy (such as a colonoscopy or flexible sigmoidoscopy) you may notice spotting of blood in your stool or on the toilet paper. If you underwent a bowel prep for your procedure, you may not have a normal bowel movement for a few days.  Please Note:  You might notice some irritation and congestion in your nose or some drainage.  This is from the oxygen used during your procedure.  There is no need for concern and it should clear up in a day or so.  SYMPTOMS TO REPORT IMMEDIATELY:   Following lower endoscopy (colonoscopy or flexible sigmoidoscopy):  Excessive amounts of blood in the stool  Significant tenderness or worsening of abdominal pains  Swelling of the abdomen that is new, acute  Fever of 100F or higher  For urgent or emergent issues, a gastroenterologist can be reached at any hour by calling [phone].   DIET:  We do recommend a small meal at first, but  then you may proceed to your regular diet.  Drink plenty of fluids but you should avoid alcoholic beverages for 24 hours.  ACTIVITY:  You should plan to take it easy for the rest of today and you should NOT DRIVE or use heavy machinery until tomorrow (because of the sedation medicines used during the test).    FOLLOW UP: Our staff will call the number listed on your records the next business day following your procedure to check on you and address any questions or concerns that you may have regarding the information given to you following your procedure. If we do not reach you, we will leave a message.  However, if you are feeling well and you are not experiencing any problems, there is no need to return our call.  We will assume that you have returned to your regular daily activities without incident.  If any biopsies were taken you will be contacted by phone or by letter within the next 1-3 weeks.  Please call us at [phone] if you have not heard about the biopsies in 3 weeks.   Repeat next Colonoscopy screening in 10 years. Diverticulosis (handout given)  SIGNATURES/CONFIDENTIALITY: You and/or your care partner have signed paperwork which will be entered into your electronic medical record.  These signatures attest to the fact that that the information above on your After Visit Summary has been reviewed and is understood.  Full responsibility of the confidentiality of this  discharge information lies with you and/or your care-partner. 

## 2017-11-06 ENCOUNTER — Telehealth: Payer: Self-pay

## 2017-11-06 NOTE — Telephone Encounter (Signed)
  Follow up Call-  Call back number 11/05/2017  Post procedure Call Back phone  # [phone] cell  Permission to leave phone message Yes  Some recent data might be hidden     Patient questions:  Do you have a fever, pain , or abdominal swelling? No. Pain Score  0 *  Have you tolerated food without any problems? Yes.    Have you been able to return to your normal activities? Yes.    Do you have any questions about your discharge instructions: Diet   No. Medications  No. Follow up visit  No.  Do you have questions or concerns about your Care? No.  Actions: * If pain score is 4 or above: No action needed, pain <4.

## 2017-12-16 DIAGNOSIS — D225 Melanocytic nevi of trunk: Secondary | ICD-10-CM | POA: Diagnosis not present

## 2017-12-16 DIAGNOSIS — L570 Actinic keratosis: Secondary | ICD-10-CM | POA: Diagnosis not present

## 2017-12-16 DIAGNOSIS — L821 Other seborrheic keratosis: Secondary | ICD-10-CM | POA: Diagnosis not present

## 2017-12-16 DIAGNOSIS — L814 Other melanin hyperpigmentation: Secondary | ICD-10-CM | POA: Diagnosis not present

## 2017-12-18 ENCOUNTER — Other Ambulatory Visit: Payer: Medicare Other

## 2017-12-18 DIAGNOSIS — N400 Enlarged prostate without lower urinary tract symptoms: Secondary | ICD-10-CM

## 2017-12-18 DIAGNOSIS — Z0000 Encounter for general adult medical examination without abnormal findings: Secondary | ICD-10-CM

## 2017-12-18 DIAGNOSIS — D696 Thrombocytopenia, unspecified: Secondary | ICD-10-CM | POA: Diagnosis not present

## 2017-12-18 DIAGNOSIS — E559 Vitamin D deficiency, unspecified: Secondary | ICD-10-CM

## 2017-12-18 DIAGNOSIS — E349 Endocrine disorder, unspecified: Secondary | ICD-10-CM | POA: Diagnosis not present

## 2017-12-18 DIAGNOSIS — E7800 Pure hypercholesterolemia, unspecified: Secondary | ICD-10-CM

## 2017-12-19 LAB — BMP8+EGFR
BUN/Creatinine Ratio: 12 (ref 10–24)
BUN: 16 mg/dL (ref 8–27)
CO2: 25 mmol/L (ref 20–29)
Calcium: 9.7 mg/dL (ref 8.6–10.2)
Chloride: 104 mmol/L (ref 96–106)
Creatinine, Ser: 1.32 mg/dL — ABNORMAL HIGH (ref 0.76–1.27)
GFR calc Af Amer: 65 mL/min/{1.73_m2} (ref >59)
GFR calc non Af Amer: 56 mL/min/{1.73_m2} — ABNORMAL LOW (ref >59)
Glucose: 94 mg/dL (ref 65–99)
Potassium: 4.8 mmol/L (ref 3.5–5.2)
Sodium: 141 mmol/L (ref 134–144)

## 2017-12-19 LAB — CBC WITH DIFFERENTIAL/PLATELET
Basophils Absolute: 0 10*3/uL (ref 0.0–0.2)
Basos: 1 %
EOS (ABSOLUTE): 0.1 10*3/uL (ref 0.0–0.4)
Eos: 3 %
Hematocrit: 45.4 % (ref 37.5–51.0)
Hemoglobin: 15.4 g/dL (ref 13.0–17.7)
Immature Grans (Abs): 0 10*3/uL (ref 0.0–0.1)
Immature Granulocytes: 0 %
Lymphocytes Absolute: 1.2 10*3/uL (ref 0.7–3.1)
Lymphs: 36 %
MCH: 31.9 pg (ref 26.6–33.0)
MCHC: 33.9 g/dL (ref 31.5–35.7)
MCV: 94 fL (ref 79–97)
Monocytes Absolute: 0.3 10*3/uL (ref 0.1–0.9)
Monocytes: 9 %
Neutrophils Absolute: 1.7 10*3/uL (ref 1.4–7.0)
Neutrophils: 51 %
Platelets: 181 10*3/uL (ref 150–379)
RBC: 4.83 x10E6/uL (ref 4.14–5.80)
RDW: 13.9 % (ref 12.3–15.4)
WBC: 3.4 10*3/uL (ref 3.4–10.8)

## 2017-12-19 LAB — PSA, TOTAL AND FREE
PSA, Free Pct: 18 %
PSA, Free: 0.27 ng/mL
Prostate Specific Ag, Serum: 1.5 ng/mL (ref 0.0–4.0)

## 2017-12-19 LAB — HEPATIC FUNCTION PANEL
ALT: 32 IU/L (ref 0–44)
AST: 38 IU/L (ref 0–40)
Albumin: 4.5 g/dL (ref 3.6–4.8)
Alkaline Phosphatase: 56 IU/L (ref 39–117)
Bilirubin Total: 1 mg/dL (ref 0.0–1.2)
Bilirubin, Direct: 0.27 mg/dL (ref 0.00–0.40)
Total Protein: 7.1 g/dL (ref 6.0–8.5)

## 2017-12-19 LAB — LIPID PANEL
Chol/HDL Ratio: 2.8 ratio (ref 0.0–5.0)
Cholesterol, Total: 139 mg/dL (ref 100–199)
HDL: 49 mg/dL (ref >39)
LDL Calculated: 72 mg/dL (ref 0–99)
Triglycerides: 92 mg/dL (ref 0–149)
VLDL Cholesterol Cal: 18 mg/dL (ref 5–40)

## 2017-12-19 LAB — VITAMIN D 25 HYDROXY (VIT D DEFICIENCY, FRACTURES): Vit D, 25-Hydroxy: 64 ng/mL (ref 30.0–100.0)

## 2017-12-19 LAB — THYROID PANEL WITH TSH
Free Thyroxine Index: 1.5 (ref 1.2–4.9)
T3 Uptake Ratio: 30 % (ref 24–39)
T4, Total: 5.1 ug/dL (ref 4.5–12.0)
TSH: 2.98 u[IU]/mL (ref 0.450–4.500)

## 2017-12-19 LAB — TESTOSTERONE,FREE AND TOTAL
Testosterone, Free: 29.6 pg/mL — ABNORMAL HIGH (ref 6.6–18.1)
Testosterone: 992 ng/dL — ABNORMAL HIGH (ref 264–916)

## 2017-12-22 ENCOUNTER — Ambulatory Visit (INDEPENDENT_AMBULATORY_CARE_PROVIDER_SITE_OTHER): Payer: Medicare Other | Admitting: Family Medicine

## 2017-12-22 ENCOUNTER — Encounter: Payer: Self-pay | Admitting: Family Medicine

## 2017-12-22 VITALS — BP 104/65 | HR 63 | Temp 97.6°F | Ht 73.0 in | Wt 197.0 lb

## 2017-12-22 DIAGNOSIS — N400 Enlarged prostate without lower urinary tract symptoms: Secondary | ICD-10-CM | POA: Diagnosis not present

## 2017-12-22 DIAGNOSIS — D696 Thrombocytopenia, unspecified: Secondary | ICD-10-CM

## 2017-12-22 DIAGNOSIS — E7800 Pure hypercholesterolemia, unspecified: Secondary | ICD-10-CM

## 2017-12-22 DIAGNOSIS — M67911 Unspecified disorder of synovium and tendon, right shoulder: Secondary | ICD-10-CM

## 2017-12-22 DIAGNOSIS — E349 Endocrine disorder, unspecified: Secondary | ICD-10-CM | POA: Diagnosis not present

## 2017-12-22 DIAGNOSIS — Z8051 Family history of malignant neoplasm of kidney: Secondary | ICD-10-CM | POA: Diagnosis not present

## 2017-12-22 DIAGNOSIS — E559 Vitamin D deficiency, unspecified: Secondary | ICD-10-CM

## 2017-12-22 MED ORDER — ICOSAPENT ETHYL 1 G PO CAPS: 2.0000 | ORAL_CAPSULE | Freq: Two times a day (BID) | ORAL | 3 refills | Status: DC

## 2017-12-22 NOTE — Patient Instructions (Addendum)
Medicare Annual Wellness Visit  [facility] and the medical providers at [facility] strive to bring you the best medical care.  In doing so we not only want to address your current medical conditions and concerns but also to detect new conditions early and prevent illness, disease and health-related problems.    Medicare offers a yearly Wellness Visit which allows our clinical staff to assess your need for preventative services including immunizations, lifestyle education, counseling to decrease risk of preventable diseases and screening for fall risk and other medical concerns.    This visit is provided free of charge (no copay) for all Medicare recipients. The clinical pharmacists at [facility] have begun to conduct these Wellness Visits which will also include a thorough review of all your medications.    As you primary medical provider recommend that you make an appointment for your Annual Wellness Visit if you have not done so already this year.  You may set up this appointment before you leave today or you may call back ([phone]) and schedule an appointment.  Please make sure when you call that you mention that you are scheduling your Annual Wellness Visit with the clinical pharmacist so that the appointment may be made for the proper length of time.     Continue current medications. Continue good therapeutic lifestyle changes which include good diet and exercise. Fall precautions discussed with patient. If an FOBT was given today- please return it to our front desk. If you are over 83 years old - you may need Prevnar 84 or the adult Pneumonia vaccine.  **Flu shots are available--- please call and schedule a FLU-CLINIC appointment**  After your visit with [place] today you will receive a survey in the mail or online from [employer] regarding your care with [place]. Please take a moment to fill this out. Your feedback is very  important to [place] as you can help [place] better understand your patient needs as well as improve your experience and satisfaction. WE CARE ABOUT YOU!!!   Follow-up with orthopedics as planned Stay active physically and avoid heavy lifting and falls as much as possible  drink plenty of water and stay well-hydrated Limit use of NSAIDs and take Tylenol instead Recheck BMP in 4 to 6 weeks

## 2017-12-22 NOTE — Progress Notes (Signed)
Subjective:    Patient ID: [name], male    DOB: [birth year], 67 y.o.   MRN: [id number]  HPI Pt here for follow up and management of chronic medical problems which includes hyperlipidemia and low testosterone. He is taking medication regularly.  The patient is doing well overall.  He has had hip replacement and he has a right rotator cuff tendinopathy and has had arthroscopy.  Family history is positive for renal cell carcinoma in his mother and Alzheimer's disease in his father.  The patient is taking vitamin D and omega-3 fatty acids and Crestor along with injectable testosterone.  May want to consider switching him to Vascepa instead of over-the-counter omega-3 fatty acids.  We will check with his insurance regarding coverage for this.  The patient has had his blood work done.  The PSA was 1.5 and stable and consistent with readings from 1 year ago.  The CBC had a normal white blood cell count and hemoglobin was good at 15.4 and the platelet count was adequate.  The blood sugar was good at 94.  The creatinine, the most important kidney function test was elevated at 1.32 this is the first time there is been an elevation with the upper end of the normal range being 1.27.  All the electrolytes including potassium are good.  All cholesterol numbers with traditional lipid testing were excellent and at goal all thyroid tests were excellent and at goal the vitamin D level was excellent at 64 all liver function tests were normal the testosterone level was slightly elevated at 992 and the free direct testosterone was slightly elevated.  The patient is doing well and exercising regularly.  He is now getting his testosterone injections at home and we are not sure what strength and getting them once a week by his wife.  He has no complaints other than problems with his shoulder and he is seeing orthopedist about this.  On reviewing his lab work with him we will repeat the BMP in about 6 weeks.  He will also find  out the strength of the testosterone injection that he is getting weekly.  Patient denies any chest pain pressure tightness or shortness of breath.  He denies any trouble with swallowing except for occasional heartburn and he will be eliminating the NSAIDs and hopefully this will help with this.  He denies any blood in the stool or black tarry bowel movements and has had a recent colonoscopy and was told to come back in 10 years.  He is passing his water well without problems he does have some erectile dysfunction issues.    Patient Active Problem List   Diagnosis Date Noted  . Pain 10/07/2017  . Thrombocytopenia ([facility]) 05/23/2016  . Testosterone deficiency 11/25/2012  . 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 Hyperlipidemia 09/25/2011   Outpatient Encounter Medications as of 12/22/2017  Medication Sig  . aspirin 81 MG tablet Take 81 mg by mouth daily.  . cholecalciferol (VITAMIN D) 1000 UNITS tablet Take 2,000 Units by mouth 2 (two) times daily.   . fish oil-omega-3 fatty acids 1000 MG capsule Take 2 g by mouth 2 (two) times daily.   Marland Kitchen OVER THE COUNTER MEDICATION Anastrozole, turmeric, glucosamine chondroitin  . rosuvastatin (CRESTOR) 10 MG tablet Take 1 tablet (10 mg total) daily by mouth.  . TESTOSTERONE IM Inject 1 Dose into the skin once a week.  . [DISCONTINUED] 0.9 %  sodium chloride infusion    No facility-administered encounter medications on  file as of 12/22/2017.      Review of Systems  Constitutional: Negative.   HENT: Negative.   Eyes: Negative.   Respiratory: Negative.   Cardiovascular: Negative.   Gastrointestinal: Negative.   Endocrine: Negative.   Genitourinary: Negative.   Musculoskeletal: Negative.   Skin: Negative.   Allergic/Immunologic: Negative.   Neurological: Negative.   Hematological: Negative.   Psychiatric/Behavioral: Negative.        Objective:   Physical Exam  Constitutional: He is oriented to person, place, and time. He appears well-developed and  well-nourished. No distress.  The patient is pleasant and positive and feeling well despite some right shoulder recurrence with arthropathy.  He has had a recent colonoscopy that was normal and he was told to come back in 10 years.  HENT:  Head: Normocephalic and atraumatic.  Right Ear: External ear normal.  Left Ear: External ear normal.  Nose: Nose normal.  Mouth/Throat: Oropharynx is clear and moist. No oropharyngeal exudate.  Eyes: Pupils are equal, round, and reactive to light. Conjunctivae and EOM are normal. Right eye exhibits no discharge. Left eye exhibits no discharge.  Neck: Normal range of motion. Neck supple. No thyromegaly present.  No bruits thyromegaly or anterior cervical adenopathy  Cardiovascular: Normal rate, regular rhythm, normal heart sounds and intact distal pulses.  No murmur heard. Heart has a regular rate and rhythm at 60/min  Pulmonary/Chest: Effort normal and breath sounds normal. He has no wheezes. He has no rales. He exhibits no tenderness.  Clear anteriorly and posteriorly and no axillary adenopathy  Abdominal: Soft. Bowel sounds are normal. He exhibits no mass. There is no tenderness. There is no guarding.  No abdominal tenderness or organ enlargement bruits epigastric tenderness or inguinal adenopathy  Genitourinary: Rectum normal and penis normal.  Genitourinary Comments: The prostate is slightly enlarged but smooth.  There were no rectal masses.  The external genitalia were within normal limits and no inguinal hernias were palpable  Musculoskeletal: Normal range of motion. He exhibits no edema or tenderness.  Good range of motion of all joints including the right shoulder without pain.  I would continue to support a wait and watch attitude with the right shoulder and avoid any future surgery if possible  Lymphadenopathy:    He has no cervical adenopathy.  Neurological: He is alert and oriented to person, place, and time. He has normal reflexes. No cranial  nerve deficit. He exhibits normal muscle tone.  Skin: Skin is warm and dry. No rash noted.  Psychiatric: He has a normal mood and affect. His behavior is normal. Judgment and thought content normal.  Patient has normal affect and behavior.  Nursing note and vitals reviewed.  BP 104/65 (BP Location: Left Arm)   Pulse 63   Temp 97.6 F (36.4 C) (Oral)   Ht 6\' 1"  (1.854 m)   Wt 197 lb (89.4 kg)   BMI 25.99 kg/m         Assessment & Plan:  1. Testosterone deficiency -Continue with current testosterone therapy with IM injections by wife at home  2. Pure hypercholesterolemia -All cholesterol numbers were excellent and he will continue with Crestor and aggressive therapeutic lifestyle changes  3. Thrombocytopenia (HCC) -The platelet count most recently was normal along with the patient's hemoglobin.  Patient has no history of any bleeding problems.  4. Benign prostatic hyperplasia, unspecified whether lower urinary tract symptoms present -The prostate remains enlarged but soft and smooth without lumps or masses.  5. Vitamin D deficiency -Continue  with current vitamin D replacement  6. Tendinopathy of right rotator cuff -Continue follow-up with orthopedics as needed  7. Family history of renal cell carcinoma -The patient has had an ultrasound in the past and may want to consider repeating one in the next year or so.  Meds ordered this encounter  Medications  . Icosapent Ethyl (VASCEPA) 1 g CAPS    Sig: Take 2 capsules (2 g total) by mouth 2 (two) times daily.    Dispense:  120 capsule    Refill:  3   Patient Instructions                       Medicare Annual Wellness Visit  [facility] and the medical providers at [facility] strive to bring you the best medical care.  In doing so we not only want to address your current medical conditions and concerns but also to detect new conditions early and prevent illness, disease and health-related problems.      Medicare offers a yearly Wellness Visit which allows our clinical staff to assess your need for preventative services including immunizations, lifestyle education, counseling to decrease risk of preventable diseases and screening for fall risk and other medical concerns.    This visit is provided free of charge (no copay) for all Medicare recipients. The clinical pharmacists at [facility] have begun to conduct these Wellness Visits which will also include a thorough review of all your medications.    As you primary medical provider recommend that you make an appointment for your Annual Wellness Visit if you have not done so already this year.  You may set up this appointment before you leave today or you may call back ([phone]) and schedule an appointment.  Please make sure when you call that you mention that you are scheduling your Annual Wellness Visit with the clinical pharmacist so that the appointment may be made for the proper length of time.     Continue current medications. Continue good therapeutic lifestyle changes which include good diet and exercise. Fall precautions discussed with patient. If an FOBT was given today- please return it to our front desk. If you are over 74 years old - you may need Prevnar 35 or the adult Pneumonia vaccine.  **Flu shots are available--- please call and schedule a FLU-CLINIC appointment**  After your visit with [place] today you will receive a survey in the mail or online from [employer] regarding your care with [place]. Please take a moment to fill this out. Your feedback is very important to [place] as you can help [place] better understand your patient needs as well as improve your experience and satisfaction. WE CARE ABOUT YOU!!!   Follow-up with orthopedics as planned Stay active physically and avoid heavy lifting and falls as much as possible  drink plenty of water and stay well-hydrated Limit use of NSAIDs and take Tylenol  instead Recheck BMP in 4 to 6 weeks  Arrie Senate MD

## 2017-12-26 ENCOUNTER — Telehealth: Payer: Self-pay

## 2017-12-26 NOTE — Telephone Encounter (Signed)
Insurance denied Vascepa 

## 2017-12-26 NOTE — Telephone Encounter (Signed)
Continue with current Omega 3 fish oil

## 2017-12-30 ENCOUNTER — Ambulatory Visit: Payer: Medicare Other | Admitting: *Deleted

## 2017-12-31 ENCOUNTER — Ambulatory Visit: Payer: Medicare Other | Admitting: *Deleted

## 2018-01-29 ENCOUNTER — Other Ambulatory Visit: Payer: Medicare Other

## 2018-01-29 DIAGNOSIS — D696 Thrombocytopenia, unspecified: Secondary | ICD-10-CM

## 2018-01-29 DIAGNOSIS — E349 Endocrine disorder, unspecified: Secondary | ICD-10-CM

## 2018-01-29 DIAGNOSIS — R7989 Other specified abnormal findings of blood chemistry: Secondary | ICD-10-CM

## 2018-01-29 DIAGNOSIS — N400 Enlarged prostate without lower urinary tract symptoms: Secondary | ICD-10-CM

## 2018-01-29 DIAGNOSIS — E7800 Pure hypercholesterolemia, unspecified: Secondary | ICD-10-CM

## 2018-01-29 DIAGNOSIS — E559 Vitamin D deficiency, unspecified: Secondary | ICD-10-CM

## 2018-01-30 LAB — BMP8+EGFR
BUN/Creatinine Ratio: 12 (ref 10–24)
BUN: 15 mg/dL (ref 8–27)
CO2: 23 mmol/L (ref 20–29)
Calcium: 9.6 mg/dL (ref 8.6–10.2)
Chloride: 102 mmol/L (ref 96–106)
Creatinine, Ser: 1.22 mg/dL (ref 0.76–1.27)
GFR calc Af Amer: 71 mL/min/{1.73_m2} (ref >59)
GFR calc non Af Amer: 61 mL/min/{1.73_m2} (ref >59)
Glucose: 96 mg/dL (ref 65–99)
Potassium: 4.6 mmol/L (ref 3.5–5.2)
Sodium: 138 mmol/L (ref 134–144)

## 2018-02-18 ENCOUNTER — Encounter: Payer: Self-pay | Admitting: *Deleted

## 2018-02-18 ENCOUNTER — Ambulatory Visit (INDEPENDENT_AMBULATORY_CARE_PROVIDER_SITE_OTHER): Payer: Medicare Other | Admitting: *Deleted

## 2018-02-18 VITALS — BP 118/81 | HR 55 | Ht 72.0 in | Wt 194.0 lb

## 2018-02-18 DIAGNOSIS — Z0000 Encounter for general adult medical examination without abnormal findings: Secondary | ICD-10-CM

## 2018-02-18 NOTE — Progress Notes (Addendum)
Subjective:   [name] is a 67 y.o. male who presents for a Medicare Annual Wellness Visit. [name] lives at home with his wife. He has twin sons that are 40 years old. One son lives in an apartment above the garage on their property. The other son is married and lives out of town. He is very active and goes to the gym 4 to 5 days a week. He also plays golf regularly and is active around the house. His son that lives with him was diagnosed with bipolar type 1 in adolescents. This has been a struggle for them as a family but he is stable at the moment.    Review of Systems    Patient reports that his overall health is unchanged compared to last year.  Cardiac Risk Factors include: advanced age (>20men, >50 women);male gender;dyslipidemia  Musculoskeletal: decreased ROM on in right shoulder. Rotator cuff repair and anchoring within the past year. This has detached again.   All other systems negative       Current Medications (verified) Outpatient Encounter Medications as of 02/18/2018  Medication Sig  . aspirin 81 MG tablet Take 81 mg by mouth daily.  . cholecalciferol (VITAMIN D) 1000 UNITS tablet Take 2,000 Units by mouth 2 (two) times daily.   . fish oil-omega-3 fatty acids 1000 MG capsule Take 2 g by mouth 2 (two) times daily.   Vanessa Kick Ethyl (VASCEPA) 1 g CAPS Take 2 capsules (2 g total) by mouth 2 (two) times daily.  Marland Kitchen OVER THE COUNTER MEDICATION Anastrozole, turmeric, glucosamine chondroitin  . rosuvastatin (CRESTOR) 10 MG tablet Take 1 tablet (10 mg total) daily by mouth.  . TESTOSTERONE IM Inject 1 Dose into the skin once a week.   No facility-administered encounter medications on file as of 02/18/2018.     Allergies (verified) Patient has no known allergies.   History: Past Medical History:  Diagnosis Date  . Actinic keratosis   . Arthritis    hip , shoulders   . Diverticulitis   . GERD (gastroesophageal reflux disease)   . Hyperlipidemia   .  Osteoarthritis    left Hip  . Pneumonia    hx of years ago   . Polyp, sigmoid colon    Past Surgical History:  Procedure Laterality Date  . COLONOSCOPY  10/10/2006  . SHOULDER ARTHROSCOPY  11/2017  . TOTAL HIP ARTHROPLASTY  10/01/2011   Procedure: TOTAL HIP ARTHROPLASTY ANTERIOR APPROACH;  Surgeon: Mauri Pole, MD;  Location: [facility];  Service: Orthopedics;  Laterality: Left;   Family History  Problem Relation Age of Onset  . Kidney cancer Mother 20  . Renal cancer Mother   . Alzheimer's disease Father 57  . Bipolar disorder Son   . Stroke Paternal Uncle   . Heart disease Paternal Grandmother   . Colon cancer Neg Hx   . Esophageal cancer Neg Hx   . Rectal cancer Neg Hx   . Stomach cancer Neg Hx   . Liver cancer Neg Hx   . Pancreatic cancer Neg Hx   . Prostate cancer Neg Hx    Social History   Socioeconomic History  . Marital status: Married    Spouse name: Not on file  . Number of children: 2  . Years of education: 16  . Highest education level: Bachelor's degree (e.g., BA, AB, BS)  Occupational History  . 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 Emergency planning/management officer  strain: Not hard at all  . Food insecurity:    Worry: Never true    Inability: Never true  . Transportation needs:    Medical: No    Non-medical: No  Tobacco Use  . Smoking status: Former Smoker    Packs/day: 1.00    Types: Cigarettes    Start date: 04/02/1983    Last attempt to quit: 04/01/1988    Years since quitting: 29.9  . Smokeless tobacco: Never Used  Substance and Sexual Activity  . Alcohol use: Yes    Alcohol/week: 4.2 oz    Types: 7 Glasses of wine per week  . Drug use: Yes    Types: Marijuana    Comment: occ. - past week  . Sexual activity: Yes  Lifestyle  . Physical activity:    Days per week: 5 days    Minutes per session: 60 min  . Stress: Not at all  Relationships  . Social connections:    Talks on phone: More than three times a week    Gets together: More  than three times a week    Attends religious service: More than 4 times per year    Active member of club or organization: No    Attends meetings of clubs or organizations: Never    Relationship status: Married  Other Topics Concern  . Not on file  Social History Narrative   Daily caffeine     Tobacco Use No.  Clinical Intake:  Pre-visit preparation completed: No  Pain : No/denies pain     Nutritional Status: BMI of 19-24  Normal Diabetes: No  How often do you need to have someone help you when you read instructions, pamphlets, or other written materials from your doctor or pharmacy?: 1 - Never What is the last grade level you completed in school?: bachelor's science in Marketing  Interpreter Needed?: No  Information entered by :: Chong Sicilian, RN  Activities of Daily Living In your present state of health, do you have any difficulty performing the following activities: 02/18/2018  Hearing? N  Vision? N  Difficulty concentrating or making decisions? N  Walking or climbing stairs? N  Dressing or bathing? N  Doing errands, shopping? N  Preparing Food and eating ? N  Using the Toilet? N  In the past six months, have you accidently leaked urine? N  Do you have problems with loss of bowel control? N  Managing your Medications? N  Managing your Finances? N  Housekeeping or managing your Housekeeping? N  Some recent data might be hidden     Diet   Exercise Current Exercise Habits: Structured exercise class, Type of exercise: strength training/weights;walking, Time (Minutes): 60, Frequency (Times/Week): 3, Weekly Exercise (Minutes/Week): 180, Intensity: Moderate, Exercise limited by: orthopedic condition(s)    Depression Screen PHQ 2/9 Scores 02/18/2018 12/22/2017 06/23/2017 12/19/2016  PHQ - 2 Score 0 0 0 0     Fall Risk Fall Risk  02/18/2018 12/22/2017 06/23/2017 12/19/2016 05/28/2016  Falls in the past year? No No No Yes No  Number falls in past yr: - - - 1 -    Injury with Fall? - - - No -    Safety Is the patient's home free of loose throw rugs in walkways, pet beds, electrical cords, etc?   yes      Grab bars in the bathroom? no      Walkin shower? yes      Shower Seat? no      Handrails on  the stairs?   yes      Adequate lighting?   yes  Patient Care Team: Chipper Herb, MD as PCP - General (Family Medicine)  Supple-ortho  Hospitalizations, surgeries, and ER visits in previous 12 months Right rotator cuff repair. No other hospitalizations, ER visits, or surgeries.   Objective:    Today's Vitals   02/18/18 1032  BP: 118/81  Pulse: (!) 55  Weight: 194 lb (88 kg)  Height: 6' (1.829 m)   Body mass index is 26.31 kg/m.  Advanced Directives 02/18/2018 10/01/2011 09/26/2011  Does Patient Have a Medical Advance Directive? Yes Patient does not have advance directive Patient does not have advance directive  Type of Advance Directive Living will;Healthcare Power of Attorney - -  Does patient want to make changes to medical advance directive? No - Patient declined - -  Copy of [place] in Chart? No - copy requested - -  Would patient like information on creating a medical advance directive? No - Patient declined - -  Pre-existing out of facility DNR order (yellow form or pink MOST form) - No No    Hearing/Vision  normal or No deficits noted during visit.   Cognitive Function: MMSE - Mini Mental State Exam 02/18/2018  Orientation to time 5  Orientation to Place 5  Registration 3  Attention/ Calculation 5  Recall 3  Language- name 2 objects 2  Language- repeat 1  Language- follow 3 step command 3  Language- read & follow direction 1  Write a sentence 1  Copy design 1  Total score 30       Normal Cognitive Function Screening: Yes    Immunizations and Health Maintenance Immunization History  Administered Date(s) Administered  . Pneumococcal Conjugate-13 12/19/2016  . Tdap 10/21/2016   Health  Maintenance Due  Topic Date Due  . COLON CANCER SCREENING ANNUAL FOBT  11/03/2016  . PNA vac Low Risk Adult (2 of 2 - PPSV23) 12/19/2017   Health Maintenance  Topic Date Due  . COLON CANCER SCREENING ANNUAL FOBT  11/03/2016  . PNA vac Low Risk Adult (2 of 2 - PPSV23) 12/19/2017  . INFLUENZA VACCINE  05/29/2019 (Originally 03/05/2018)  . Hepatitis C Screening  12/19/2021 (Originally Dec 05, 1950)  . TETANUS/TDAP  10/22/2026  . COLONOSCOPY  11/06/2027        Assessment:   This is a routine wellness examination for [place].      Plan:    Goals    . Exercise 150 min/wk Moderate Activity        Health Maintenance Recommendations: No recommendations at this time  Additional Screening Recommendations: Lung: Low Dose CT Chest recommended if Age 38-80 years, 30 pack-year currently smoking OR have quit w/in 15years. Patient does not qualify. Hepatitis C Screening recommended: no  Today's Orders No orders of the defined types were placed in this encounter.   Keep f/u with Chipper Herb, MD and any other specialty appointments you may have Continue current medications Move carefully to avoid falls. Use assistive devices like a cane or walker if needed. Aim for at least 150 minutes of moderate activity a week. This can be chair exercises if necessary. Reading or puzzles are a good way to exercise your brain Stay connected with friends and family. Social connections are beneficial to your emotional and mental health.   I have personally reviewed and noted the following in the patient's chart:   . Medical and social history . Use of alcohol, tobacco  or illicit drugs  . Current medications and supplements . Functional ability and status . Nutritional status . Physical activity . Advanced directives . List of other physicians . Hospitalizations, surgeries, and ER visits in previous 12 months . Vitals . Screenings to include cognitive, depression, and falls . Referrals and  appointments  In addition, I have reviewed and discussed with patient certain preventive protocols, quality metrics, and best practice recommendations. A written personalized care plan for preventive services as well as general preventive health recommendations were provided to patient.     Chong Sicilian, RN   02/19/2018    I have reviewed and agree with the above AWV documentation.  Claretta Fraise, M.D.

## 2018-02-18 NOTE — Patient Instructions (Addendum)
  [name],  Thank you for taking time to come for your Medicare Wellness Visit. I appreciate your ongoing commitment to your health goals. Please review the following plan we discussed and let me know if I can assist you in the future.   These are the goals we discussed: Goals    . Exercise 150 min/wk Moderate Activity       This is a list of the screening recommended for you and due dates:  Health Maintenance  Topic Date Due  . Stool Blood Test  11/03/2016  . Pneumonia vaccines (2 of 2 - PPSV23) 12/19/2017  . Flu Shot  05/29/2019*  .  Hepatitis C: One time screening is recommended by Center for Disease Control  (CDC) for  adults born from [birth year] through 1965.   12/19/2021*  . Tetanus Vaccine  10/22/2026  . Colon Cancer Screening  11/06/2027  *Topic was postponed. The date shown is not the original due date.

## 2018-04-13 DIAGNOSIS — M2550 Pain in unspecified joint: Secondary | ICD-10-CM | POA: Diagnosis not present

## 2018-04-13 DIAGNOSIS — R5383 Other fatigue: Secondary | ICD-10-CM | POA: Diagnosis not present

## 2018-04-13 DIAGNOSIS — R6882 Decreased libido: Secondary | ICD-10-CM | POA: Diagnosis not present

## 2018-04-13 DIAGNOSIS — E291 Testicular hypofunction: Secondary | ICD-10-CM | POA: Diagnosis not present

## 2018-04-14 DIAGNOSIS — R5383 Other fatigue: Secondary | ICD-10-CM | POA: Diagnosis not present

## 2018-04-14 DIAGNOSIS — E291 Testicular hypofunction: Secondary | ICD-10-CM | POA: Diagnosis not present

## 2018-04-14 DIAGNOSIS — R6882 Decreased libido: Secondary | ICD-10-CM | POA: Diagnosis not present

## 2018-06-16 DIAGNOSIS — L570 Actinic keratosis: Secondary | ICD-10-CM | POA: Diagnosis not present

## 2018-06-16 DIAGNOSIS — C44319 Basal cell carcinoma of skin of other parts of face: Secondary | ICD-10-CM | POA: Diagnosis not present

## 2018-06-16 DIAGNOSIS — L821 Other seborrheic keratosis: Secondary | ICD-10-CM | POA: Diagnosis not present

## 2018-06-16 DIAGNOSIS — L578 Other skin changes due to chronic exposure to nonionizing radiation: Secondary | ICD-10-CM | POA: Diagnosis not present

## 2018-06-29 ENCOUNTER — Other Ambulatory Visit: Payer: Medicare Other

## 2018-06-29 DIAGNOSIS — E349 Endocrine disorder, unspecified: Secondary | ICD-10-CM | POA: Diagnosis not present

## 2018-06-29 DIAGNOSIS — E7800 Pure hypercholesterolemia, unspecified: Secondary | ICD-10-CM

## 2018-06-29 DIAGNOSIS — N400 Enlarged prostate without lower urinary tract symptoms: Secondary | ICD-10-CM | POA: Diagnosis not present

## 2018-06-29 DIAGNOSIS — D696 Thrombocytopenia, unspecified: Secondary | ICD-10-CM | POA: Diagnosis not present

## 2018-06-29 DIAGNOSIS — E559 Vitamin D deficiency, unspecified: Secondary | ICD-10-CM

## 2018-06-30 LAB — BMP8+EGFR
BUN/Creatinine Ratio: 8 — ABNORMAL LOW (ref 10–24)
BUN: 10 mg/dL (ref 8–27)
CO2: 24 mmol/L (ref 20–29)
Calcium: 9.4 mg/dL (ref 8.6–10.2)
Chloride: 102 mmol/L (ref 96–106)
Creatinine, Ser: 1.22 mg/dL (ref 0.76–1.27)
GFR calc Af Amer: 70 mL/min/{1.73_m2} (ref >59)
GFR calc non Af Amer: 61 mL/min/{1.73_m2} (ref >59)
Glucose: 100 mg/dL — ABNORMAL HIGH (ref 65–99)
Potassium: 4.1 mmol/L (ref 3.5–5.2)
Sodium: 140 mmol/L (ref 134–144)

## 2018-06-30 LAB — CBC WITH DIFFERENTIAL/PLATELET
Basophils Absolute: 0.1 10*3/uL (ref 0.0–0.2)
Basos: 1 %
EOS (ABSOLUTE): 0.1 10*3/uL (ref 0.0–0.4)
Eos: 3 %
Hematocrit: 43.6 % (ref 37.5–51.0)
Hemoglobin: 15.2 g/dL (ref 13.0–17.7)
Immature Grans (Abs): 0 10*3/uL (ref 0.0–0.1)
Immature Granulocytes: 0 %
Lymphocytes Absolute: 1.4 10*3/uL (ref 0.7–3.1)
Lymphs: 37 %
MCH: 32.8 pg (ref 26.6–33.0)
MCHC: 34.9 g/dL (ref 31.5–35.7)
MCV: 94 fL (ref 79–97)
Monocytes Absolute: 0.3 10*3/uL (ref 0.1–0.9)
Monocytes: 9 %
Neutrophils Absolute: 1.9 10*3/uL (ref 1.4–7.0)
Neutrophils: 50 %
Platelets: 158 10*3/uL (ref 150–450)
RBC: 4.63 x10E6/uL (ref 4.14–5.80)
RDW: 12.6 % (ref 12.3–15.4)
WBC: 3.9 10*3/uL (ref 3.4–10.8)

## 2018-06-30 LAB — HEPATIC FUNCTION PANEL
ALT: 29 IU/L (ref 0–44)
AST: 34 IU/L (ref 0–40)
Albumin: 4.4 g/dL (ref 3.6–4.8)
Alkaline Phosphatase: 55 IU/L (ref 39–117)
Bilirubin Total: 1 mg/dL (ref 0.0–1.2)
Bilirubin, Direct: 0.23 mg/dL (ref 0.00–0.40)
Total Protein: 6.5 g/dL (ref 6.0–8.5)

## 2018-06-30 LAB — LIPID PANEL
Chol/HDL Ratio: 2.8 ratio (ref 0.0–5.0)
Cholesterol, Total: 143 mg/dL (ref 100–199)
HDL: 51 mg/dL (ref >39)
LDL Calculated: 75 mg/dL (ref 0–99)
Triglycerides: 86 mg/dL (ref 0–149)
VLDL Cholesterol Cal: 17 mg/dL (ref 5–40)

## 2018-06-30 LAB — PSA, TOTAL AND FREE
PSA, Free Pct: 21.4 %
PSA, Free: 0.3 ng/mL
Prostate Specific Ag, Serum: 1.4 ng/mL (ref 0.0–4.0)

## 2018-06-30 LAB — TESTOSTERONE,FREE AND TOTAL
Testosterone, Free: 31.2 pg/mL — ABNORMAL HIGH (ref 6.6–18.1)
Testosterone: 1411 ng/dL — ABNORMAL HIGH (ref 264–916)

## 2018-06-30 LAB — VITAMIN D 25 HYDROXY (VIT D DEFICIENCY, FRACTURES): Vit D, 25-Hydroxy: 47.6 ng/mL (ref 30.0–100.0)

## 2018-07-01 ENCOUNTER — Ambulatory Visit (INDEPENDENT_AMBULATORY_CARE_PROVIDER_SITE_OTHER): Payer: Medicare Other

## 2018-07-01 ENCOUNTER — Encounter: Payer: Self-pay | Admitting: Family Medicine

## 2018-07-01 ENCOUNTER — Ambulatory Visit (INDEPENDENT_AMBULATORY_CARE_PROVIDER_SITE_OTHER): Payer: Medicare Other | Admitting: Family Medicine

## 2018-07-01 VITALS — BP 115/64 | HR 61 | Temp 97.3°F | Ht 72.0 in | Wt 193.0 lb

## 2018-07-01 DIAGNOSIS — N400 Enlarged prostate without lower urinary tract symptoms: Secondary | ICD-10-CM | POA: Diagnosis not present

## 2018-07-01 DIAGNOSIS — E7800 Pure hypercholesterolemia, unspecified: Secondary | ICD-10-CM

## 2018-07-01 DIAGNOSIS — Z8051 Family history of malignant neoplasm of kidney: Secondary | ICD-10-CM | POA: Diagnosis not present

## 2018-07-01 DIAGNOSIS — Z23 Encounter for immunization: Secondary | ICD-10-CM

## 2018-07-01 DIAGNOSIS — E559 Vitamin D deficiency, unspecified: Secondary | ICD-10-CM | POA: Diagnosis not present

## 2018-07-01 DIAGNOSIS — E349 Endocrine disorder, unspecified: Secondary | ICD-10-CM | POA: Diagnosis not present

## 2018-07-01 DIAGNOSIS — M545 Low back pain, unspecified: Secondary | ICD-10-CM

## 2018-07-01 DIAGNOSIS — M47816 Spondylosis without myelopathy or radiculopathy, lumbar region: Secondary | ICD-10-CM | POA: Diagnosis not present

## 2018-07-01 DIAGNOSIS — M4186 Other forms of scoliosis, lumbar region: Secondary | ICD-10-CM | POA: Diagnosis not present

## 2018-07-01 DIAGNOSIS — R39198 Other difficulties with micturition: Secondary | ICD-10-CM | POA: Diagnosis not present

## 2018-07-01 DIAGNOSIS — D696 Thrombocytopenia, unspecified: Secondary | ICD-10-CM | POA: Diagnosis not present

## 2018-07-01 LAB — URINALYSIS, COMPLETE
Bilirubin, UA: NEGATIVE
Glucose, UA: NEGATIVE
Ketones, UA: NEGATIVE
Leukocytes, UA: NEGATIVE
Nitrite, UA: NEGATIVE
Protein, UA: NEGATIVE
RBC, UA: NEGATIVE
Specific Gravity, UA: 1.015 (ref 1.005–1.030)
Urobilinogen, Ur: 0.2 mg/dL (ref 0.2–1.0)
pH, UA: 7 (ref 5.0–7.5)

## 2018-07-01 LAB — MICROSCOPIC EXAMINATION
Bacteria, UA: NONE SEEN
Renal Epithel, UA: NONE SEEN /hpf
WBC, UA: NONE SEEN /hpf (ref 0–5)

## 2018-07-01 NOTE — Patient Instructions (Addendum)
Medicare Annual Wellness Visit  [facility] and the medical providers at [facility] strive to bring you the best medical care.  In doing so we not only want to address your current medical conditions and concerns but also to detect new conditions early and prevent illness, disease and health-related problems.    Medicare offers a yearly Wellness Visit which allows our clinical staff to assess your need for preventative services including immunizations, lifestyle education, counseling to decrease risk of preventable diseases and screening for fall risk and other medical concerns.    This visit is provided free of charge (no copay) for all Medicare recipients. The clinical pharmacists at [facility] have begun to conduct these Wellness Visits which will also include a thorough review of all your medications.    As you primary medical provider recommend that you make an appointment for your Annual Wellness Visit if you have not done so already this year.  You may set up this appointment before you leave today or you may call back ([phone]) and schedule an appointment.  Please make sure when you call that you mention that you are scheduling your Annual Wellness Visit with the clinical pharmacist so that the appointment may be made for the proper length of time.     Continue current medications. Continue good therapeutic lifestyle changes which include good diet and exercise. Fall precautions discussed with patient. If an FOBT was given today- please return it to our front desk. If you are over 45 years old - you may need Prevnar 4 or the adult Pneumonia vaccine.  **Flu shots are available--- please call and schedule a FLU-CLINIC appointment**  After your visit with [place] today you will receive a survey in the mail or online from [employer] regarding your care with [place]. Please take a moment to fill this out. Your feedback is very  important to [place] as you can help [place] better understand your patient needs as well as improve your experience and satisfaction. WE CARE ABOUT YOU!!!  The patient will call back so that we will know exactly the dose of his testosterone that he is currently taking weekly. He will continue to stay active and try to avoid any falls or injuries He will have his testosterone level repeated midweek in about 6 weeks We will call with x-ray results as soon as these results become available Patient could take a Pepcid AC before a meal and take an Aleve after a meal and should do this if he has a lot more stiffness and pain than usual in his back.  Otherwise he should be content with taking an extra strength Tylenol just for pain.

## 2018-07-01 NOTE — Progress Notes (Signed)
Subjective:    Patient ID: [name], male    DOB: Mar 03, 1951, 67 y.o.   MRN: [id number]  HPI Pt here for follow up and management of chronic medical problems which includes hyperlipidemia. He is taking medication regularly.  The patient has a history of arthritis in his shoulders and hips.  He has had a hip replacement.  He has also has GERD and hyperlipidemia.  Family history is positive for Alzheimer's disease and renal cancer.  The patient is currently on testosterone replacement Crestor and omega-3 fatty acids along with vitamin D3 1000 units.  He does complain today with low back and bilateral hip pain.  He complains of tingling sensation in his right forearm and says that his urine flow seems to be slower.  He has had a skin cancer and is scheduled for surgery upcoming December of this year.  He will get a pneumonia shot today.  He has had his lab work done and we will review this with him during the visit today.  Both of his testosterone levels the total and free were both elevated.  We will need to reduce his testosterone treatment slightly.  The blood sugar was elevated at 100.  The creatinine was within normal limits as well as was all of the electrolytes.  The CBC was normal with a good hemoglobin at 15.2 and normal white blood cell count.  Platelets were adequate.  All cholesterol numbers were good and at goal with an LDL C being 75 good cholesterol being 51 triglycerides being good at 86.  Vitamin D level was good at 47.6 the PSA remains stable at 1.4.  All liver function tests were normal.  She describes the none notes in the right forearm at times not associated with any injury.  He also describes the problem with his low back which seems to get better with activity and especially is worse with arising and getting up in the morning.  He denies any injury.  He denies any chest pain pressure or tightness or shortness of breath anymore than usual other than when he thinks he received a bite  on his forearm the summer he did have some chest discomfort after this bite.  He denies any trouble swallowing denies any heartburn nausea vomiting diarrhea blood in the stool or black tarry bowel movements.  He is passing his water well other than it being slower than usual.  He recently had an eye exam and everything was stable with that.  He is also being followed for a skin cancer on his forehead.   Patient Active Problem List   Diagnosis Date Noted  . Pain 10/07/2017  . Thrombocytopenia ([place]) 05/23/2016  . Testosterone deficiency 11/25/2012  . 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 Hyperlipidemia 09/25/2011   Outpatient Encounter Medications as of 07/01/2018  Medication Sig  . aspirin 81 MG tablet Take 81 mg by mouth daily.  . cholecalciferol (VITAMIN D) 1000 UNITS tablet Take 2,000 Units by mouth 2 (two) times daily.   . fish oil-omega-3 fatty acids 1000 MG capsule Take 2 g by mouth 2 (two) times daily.   [facility] OVER THE COUNTER MEDICATION Anastrozole, turmeric, glucosamine chondroitin  . rosuvastatin (CRESTOR) 10 MG tablet Take 1 tablet (10 mg total) daily by mouth.  . TESTOSTERONE IM Inject 1 Dose into the skin once a week.  . [DISCONTINUED] Icosapent Ethyl (VASCEPA) 1 g CAPS Take 2 capsules (2 g total) by mouth 2 (two) times daily.   No  facility-administered encounter medications on file as of 07/01/2018.       Review of Systems  Constitutional: Negative.   HENT: Negative.   Eyes: Negative.   Respiratory: Negative.   Cardiovascular: Negative.   Gastrointestinal: Negative.   Endocrine: Negative.   Genitourinary: Negative.   Musculoskeletal: Positive for back pain (low back and bilat hip pain ).  Skin: Negative.   Allergic/Immunologic: Negative.   Neurological: Positive for numbness (tingle of right forearm at times ).  Hematological: Negative.   Psychiatric/Behavioral: Negative.        Objective:   Physical Exam  Constitutional: He is oriented to person, place, and time. He  appears well-developed and well-nourished. No distress.  The patient is pleasant and doing well even though he has several complaints.  HENT:  Head: Normocephalic and atraumatic.  Right Ear: External ear normal.  Left Ear: External ear normal.  Mouth/Throat: Oropharynx is clear and moist. No oropharyngeal exudate.  Slight nasal congestion bilaterally  Eyes: Pupils are equal, round, and reactive to light. Conjunctivae and EOM are normal. Right eye exhibits no discharge. Left eye exhibits no discharge. No scleral icterus.  Recent eye exam was normal  Neck: Normal range of motion. Neck supple. No thyromegaly present.  No bruits thyromegaly or anterior cervical adenopathy  Cardiovascular: Normal rate, regular rhythm, normal heart sounds and intact distal pulses.  No murmur heard. The heart is regular at 60/min without murmurs.  Good pedal pulses.  Pulmonary/Chest: Effort normal and breath sounds normal. He has no wheezes. He has no rales. He exhibits no tenderness.  Clear anteriorly and posteriorly and no axillary adenopathy or chest wall masses  Abdominal: Soft. Bowel sounds are normal. He exhibits no mass. There is no tenderness.  No liver or spleen enlargement.  No epigastric tenderness.  No inguinal adenopathy.  Good inguinal pulses.  Genitourinary: Rectum normal and penis normal.  Genitourinary Comments: The prostate is slightly enlarged but soft and smooth without lumps or masses.  There were no rectal masses.  External genitalia were within normal limits with no testicular masses or inguinal adenopathy or inguinal hernias.  Musculoskeletal: Normal range of motion. He exhibits no edema or tenderness.  Range of motion appeared normal though patient did note with getting off the table he had a low back stiffness.  Lymphadenopathy:    He has no cervical adenopathy.  Neurological: He is alert and oriented to person, place, and time. He has normal reflexes. No cranial nerve deficit.  Skin:  Skin is warm and dry. No rash noted.  Psychiatric: He has a normal mood and affect. His behavior is normal. Judgment and thought content normal.  The patient's mood affect and behavior were all normal for him today.  Nursing note and vitals reviewed.  BP 115/64 (BP Location: Left Arm)   Pulse 61   Temp (!) 97.3 F (36.3 C) (Oral)   Ht 6' (1.829 m)   Wt 193 lb (87.5 kg)   BMI 26.18 kg/m   Chest x-ray, C-spine, and LS-spine are pending== EKG results are pending===      Assessment & Plan:  1. Testosterone deficiency -Continue testosterone injections as currently doing and patient will call back so that we know exactly how much he is getting per week.  He may have received more than usual recently and this could be accounting for why his testosterone levels were slightly elevated.  We will repeat the testosterone level in about 6 weeks and he will come by for a midweek blood  drawl.  His wife gives him an injection every Friday. - Urinalysis, Complete - Testosterone,Free and Total; Future  2. Pure hypercholesterolemia -Continue with current treatment as all cholesterol numbers are good. - DG Chest 2 View; Future - EKG 12-Lead  3. Benign prostatic hyperplasia, unspecified whether lower urinary tract symptoms present -Prostate remains smooth and slightly enlarged.  No lumps or masses were detected in the patient's frequency is probably related to his mild BPH. - Urinalysis, Complete  4. Thrombocytopenia (HCC) -The platelet count this time was normal  5. Vitamin D deficiency -Continue with vitamin D replacement  6. Family history of renal cell carcinoma -Get urinalysis every 6 months with exam - Urinalysis, Complete  7. Slow urinary stream -Continue to monitor his BPH and if frequency and voiding habits worsen he will need referral to the urologist - Urinalysis, Complete - Urine Culture  8. Low back pain, unspecified back pain laterality, unspecified chronicity, unspecified  whether sciatica present -Patient may try an Aleve once daily after eating and taking Pepcid AC prior to that meal.  He understands that it could bother his stomach. - DG Lumbar Spine 2-3 Views; Future  Patient Instructions                       Medicare Annual Wellness Visit  [name] and the medical providers at [facility] strive to bring you the best medical care.  In doing so we not only want to address your current medical conditions and concerns but also to detect new conditions early and prevent illness, disease and health-related problems.    Medicare offers a yearly Wellness Visit which allows our clinical staff to assess your need for preventative services including immunizations, lifestyle education, counseling to decrease risk of preventable diseases and screening for fall risk and other medical concerns.    This visit is provided free of charge (no copay) for all Medicare recipients. The clinical pharmacists at [facility] have begun to conduct these Wellness Visits which will also include a thorough review of all your medications.    As you primary medical provider recommend that you make an appointment for your Annual Wellness Visit if you have not done so already this year.  You may set up this appointment before you leave today or you may call back ([phone]) and schedule an appointment.  Please make sure when you call that you mention that you are scheduling your Annual Wellness Visit with the clinical pharmacist so that the appointment may be made for the proper length of time.     Continue current medications. Continue good therapeutic lifestyle changes which include good diet and exercise. Fall precautions discussed with patient. If an FOBT was given today- please return it to our front desk. If you are over 1 years old - you may need Prevnar 2 or the adult Pneumonia vaccine.  **Flu shots are available--- please call and  schedule a FLU-CLINIC appointment**  After your visit with [place] today you will receive a survey in the mail or online from [employer] regarding your care with [place]. Please take a moment to fill this out. Your feedback is very important to [place] as you can help [place] better understand your patient needs as well as improve your experience and satisfaction. WE CARE ABOUT YOU!!!  The patient will call back so that we will know exactly the dose of his testosterone that he is currently taking weekly. He will continue to  stay active and try to avoid any falls or injuries He will have his testosterone level repeated midweek in about 6 weeks We will call with x-ray results as soon as these results become available Patient could take a Pepcid AC before a meal and take an Aleve after a meal and should do this if he has a lot more stiffness and pain than usual in his back.  Otherwise he should be content with taking an extra strength Tylenol just for pain.   Arrie Senate MD

## 2018-07-01 NOTE — Addendum Note (Signed)
Addended by: Zannie Cove on: 07/01/2018 10:09 AM   Modules accepted: Orders

## 2018-07-05 LAB — URINE CULTURE

## 2018-07-06 ENCOUNTER — Other Ambulatory Visit: Payer: Self-pay | Admitting: *Deleted

## 2018-07-06 MED ORDER — CIPROFLOXACIN HCL 500 MG PO TABS: 500.0000 mg | ORAL_TABLET | Freq: Two times a day (BID) | ORAL | 0 refills | Status: DC

## 2018-07-14 ENCOUNTER — Telehealth: Payer: Self-pay

## 2018-07-14 NOTE — Telephone Encounter (Signed)
Pt gets testosterone rx from place called [facility] so advised they would need to do the prior auth and pt voiced understanding.

## 2018-07-14 NOTE — Telephone Encounter (Signed)
What is strength

## 2018-07-14 NOTE — Telephone Encounter (Signed)
Patient calling that Testosterone needs prior auth  I am trying to do through cover my meds but from medication list I do not know a strength as it just says Testosterone IM  Would you know the strength I should pick?   [facility] PCP

## 2018-07-14 NOTE — Telephone Encounter (Signed)
I don't see that we have ever given him rx for Testosterone injection. Lmtcb on pt VM.

## 2018-07-21 DIAGNOSIS — C44319 Basal cell carcinoma of skin of other parts of face: Secondary | ICD-10-CM | POA: Diagnosis not present

## 2018-07-28 ENCOUNTER — Other Ambulatory Visit: Payer: Self-pay | Admitting: Family Medicine

## 2018-08-06 ENCOUNTER — Telehealth: Payer: Self-pay | Admitting: Family Medicine

## 2018-08-06 MED ORDER — ROSUVASTATIN CALCIUM 10 MG PO TABS: 10.0000 mg | ORAL_TABLET | Freq: Every day | ORAL | 1 refills | Status: DC

## 2018-08-06 NOTE — Telephone Encounter (Signed)
PT has called states that the rosuvastatin (CRESTOR) 10 MG tablet was suppose to be sent to [facility] can we please fix and call him once this is done

## 2018-08-06 NOTE — Telephone Encounter (Signed)
[facility] [place] and pt is aware.

## 2018-08-28 ENCOUNTER — Other Ambulatory Visit: Payer: Medicare Other

## 2018-08-28 DIAGNOSIS — E349 Endocrine disorder, unspecified: Secondary | ICD-10-CM

## 2018-09-02 ENCOUNTER — Other Ambulatory Visit: Payer: Self-pay | Admitting: *Deleted

## 2018-09-02 ENCOUNTER — Other Ambulatory Visit: Payer: Medicare Other

## 2018-09-02 DIAGNOSIS — E559 Vitamin D deficiency, unspecified: Secondary | ICD-10-CM

## 2018-09-02 DIAGNOSIS — E349 Endocrine disorder, unspecified: Secondary | ICD-10-CM

## 2018-09-02 DIAGNOSIS — D696 Thrombocytopenia, unspecified: Secondary | ICD-10-CM

## 2018-09-02 DIAGNOSIS — E7800 Pure hypercholesterolemia, unspecified: Secondary | ICD-10-CM

## 2018-09-02 DIAGNOSIS — N400 Enlarged prostate without lower urinary tract symptoms: Secondary | ICD-10-CM

## 2018-09-02 DIAGNOSIS — Z8051 Family history of malignant neoplasm of kidney: Secondary | ICD-10-CM

## 2018-09-02 LAB — MICROSCOPIC EXAMINATION
Bacteria, UA: NONE SEEN
Epithelial Cells (non renal): NONE SEEN /hpf (ref 0–10)
RBC, UA: NONE SEEN /hpf (ref 0–2)
Renal Epithel, UA: NONE SEEN /hpf
WBC, UA: NONE SEEN /hpf (ref 0–5)

## 2018-09-02 LAB — URINALYSIS, COMPLETE
Bilirubin, UA: NEGATIVE
Glucose, UA: NEGATIVE
Leukocytes, UA: NEGATIVE
Nitrite, UA: NEGATIVE
Protein, UA: NEGATIVE
RBC, UA: NEGATIVE
Specific Gravity, UA: 1.02 (ref 1.005–1.030)
Urobilinogen, Ur: 0.2 mg/dL (ref 0.2–1.0)
pH, UA: 5.5 (ref 5.0–7.5)

## 2018-09-02 NOTE — Progress Notes (Signed)
Pt aware that [facility] = [facility] ordered his Test med and they will have to do the PA for insurance coverage.

## 2018-09-02 NOTE — Addendum Note (Signed)
Addended by: Zannie Cove on: 09/02/2018 09:55 AM   Modules accepted: Orders

## 2018-09-02 NOTE — Addendum Note (Signed)
Addended by: Earlene Plater on: 09/02/2018 11:14 AM   Modules accepted: Orders

## 2018-09-03 LAB — TESTOSTERONE,FREE AND TOTAL
Testosterone, Free: 22.7 pg/mL — ABNORMAL HIGH (ref 6.6–18.1)
Testosterone: 905 ng/dL (ref 264–916)

## 2018-09-04 ENCOUNTER — Other Ambulatory Visit: Payer: Self-pay

## 2018-09-04 ENCOUNTER — Other Ambulatory Visit: Payer: Self-pay | Admitting: *Deleted

## 2018-09-04 ENCOUNTER — Telehealth: Payer: Self-pay | Admitting: Family Medicine

## 2018-09-04 NOTE — Telephone Encounter (Signed)
Refill this patient's testosterone

## 2018-09-04 NOTE — Telephone Encounter (Signed)
Pt will call back with RX on Monday

## 2018-09-04 NOTE — Telephone Encounter (Signed)
He does not have Testosterone on his med list

## 2018-09-05 NOTE — Telephone Encounter (Signed)
[name], please take care of this testosterone prescription for this patient when you return next week

## 2018-09-07 ENCOUNTER — Other Ambulatory Visit: Payer: Self-pay | Admitting: *Deleted

## 2018-09-07 MED ORDER — TESTOSTERONE CYPIONATE 200 MG/ML IM SOLN: 150.0000 mg | INTRAMUSCULAR | 1 refills | Status: DC

## 2018-09-07 NOTE — Progress Notes (Signed)
Pt requesting RX for Testosterone be sent to [facility] per Dr Laurance Flatten

## 2018-09-07 NOTE — Progress Notes (Signed)
This is okay to refill for this patient

## 2018-09-07 NOTE — Progress Notes (Unsigned)
Pt requesting Testosterone refill Okay per Dr Laurance Flatten

## 2018-09-07 NOTE — Telephone Encounter (Signed)
Pt gets this med from [facility] he sees a PA there. He needs the med, but it is under prior approval at [facility] for insurance.

## 2018-09-08 MED ORDER — TESTOSTERONE CYPIONATE 200 MG/ML IM SOLN: 150.0000 mg | INTRAMUSCULAR | 1 refills | Status: DC

## 2018-09-08 NOTE — Progress Notes (Unsigned)
Dr Laurance Flatten -please sign the RX at the bottom.

## 2018-10-02 DIAGNOSIS — M545 Low back pain: Secondary | ICD-10-CM | POA: Diagnosis not present

## 2018-10-02 DIAGNOSIS — M5136 Other intervertebral disc degeneration, lumbar region: Secondary | ICD-10-CM | POA: Diagnosis not present

## 2018-10-02 DIAGNOSIS — M419 Scoliosis, unspecified: Secondary | ICD-10-CM | POA: Diagnosis not present

## 2018-10-02 DIAGNOSIS — M2170 Unequal limb length (acquired), unspecified site: Secondary | ICD-10-CM | POA: Diagnosis not present

## 2018-10-15 DIAGNOSIS — M419 Scoliosis, unspecified: Secondary | ICD-10-CM | POA: Diagnosis not present

## 2018-12-17 DIAGNOSIS — L814 Other melanin hyperpigmentation: Secondary | ICD-10-CM | POA: Diagnosis not present

## 2018-12-17 DIAGNOSIS — D225 Melanocytic nevi of trunk: Secondary | ICD-10-CM | POA: Diagnosis not present

## 2018-12-17 DIAGNOSIS — L570 Actinic keratosis: Secondary | ICD-10-CM | POA: Diagnosis not present

## 2018-12-17 DIAGNOSIS — D1801 Hemangioma of skin and subcutaneous tissue: Secondary | ICD-10-CM | POA: Diagnosis not present

## 2018-12-17 DIAGNOSIS — L821 Other seborrheic keratosis: Secondary | ICD-10-CM | POA: Diagnosis not present

## 2018-12-17 DIAGNOSIS — Z85828 Personal history of other malignant neoplasm of skin: Secondary | ICD-10-CM | POA: Diagnosis not present

## 2018-12-29 ENCOUNTER — Other Ambulatory Visit: Payer: Self-pay

## 2018-12-29 ENCOUNTER — Other Ambulatory Visit: Payer: Medicare Other

## 2018-12-29 DIAGNOSIS — N400 Enlarged prostate without lower urinary tract symptoms: Secondary | ICD-10-CM

## 2018-12-29 DIAGNOSIS — E7800 Pure hypercholesterolemia, unspecified: Secondary | ICD-10-CM | POA: Diagnosis not present

## 2018-12-29 DIAGNOSIS — E349 Endocrine disorder, unspecified: Secondary | ICD-10-CM | POA: Diagnosis not present

## 2018-12-29 DIAGNOSIS — Z8051 Family history of malignant neoplasm of kidney: Secondary | ICD-10-CM | POA: Diagnosis not present

## 2018-12-29 DIAGNOSIS — E559 Vitamin D deficiency, unspecified: Secondary | ICD-10-CM | POA: Diagnosis not present

## 2018-12-29 DIAGNOSIS — D696 Thrombocytopenia, unspecified: Secondary | ICD-10-CM

## 2018-12-30 ENCOUNTER — Other Ambulatory Visit: Payer: Self-pay | Admitting: *Deleted

## 2018-12-30 DIAGNOSIS — R748 Abnormal levels of other serum enzymes: Secondary | ICD-10-CM

## 2018-12-30 LAB — PSA, TOTAL AND FREE
PSA, Free Pct: 24.2 %
PSA, Free: 0.29 ng/mL
Prostate Specific Ag, Serum: 1.2 ng/mL (ref 0.0–4.0)

## 2018-12-30 LAB — LIPID PANEL
Chol/HDL Ratio: 2.4 ratio (ref 0.0–5.0)
Cholesterol, Total: 128 mg/dL (ref 100–199)
HDL: 54 mg/dL (ref >39)
LDL Calculated: 58 mg/dL (ref 0–99)
Triglycerides: 78 mg/dL (ref 0–149)
VLDL Cholesterol Cal: 16 mg/dL (ref 5–40)

## 2018-12-30 LAB — BMP8+EGFR
BUN/Creatinine Ratio: 19 (ref 10–24)
BUN: 20 mg/dL (ref 8–27)
CO2: 21 mmol/L (ref 20–29)
Calcium: 9.3 mg/dL (ref 8.6–10.2)
Chloride: 104 mmol/L (ref 96–106)
Creatinine, Ser: 1.08 mg/dL (ref 0.76–1.27)
GFR calc Af Amer: 82 mL/min/{1.73_m2} (ref >59)
GFR calc non Af Amer: 71 mL/min/{1.73_m2} (ref >59)
Glucose: 100 mg/dL — ABNORMAL HIGH (ref 65–99)
Potassium: 4.4 mmol/L (ref 3.5–5.2)
Sodium: 140 mmol/L (ref 134–144)

## 2018-12-30 LAB — HEPATIC FUNCTION PANEL
ALT: 30 IU/L (ref 0–44)
AST: 44 IU/L — ABNORMAL HIGH (ref 0–40)
Albumin: 4.4 g/dL (ref 3.8–4.8)
Alkaline Phosphatase: 56 IU/L (ref 39–117)
Bilirubin Total: 1.6 mg/dL — ABNORMAL HIGH (ref 0.0–1.2)
Bilirubin, Direct: 0.39 mg/dL (ref 0.00–0.40)
Total Protein: 6.4 g/dL (ref 6.0–8.5)

## 2018-12-30 LAB — CBC WITH DIFFERENTIAL/PLATELET
Basophils Absolute: 0 10*3/uL (ref 0.0–0.2)
Basos: 1 %
EOS (ABSOLUTE): 0.2 10*3/uL (ref 0.0–0.4)
Eos: 5 %
Hematocrit: 46.8 % (ref 37.5–51.0)
Hemoglobin: 15.9 g/dL (ref 13.0–17.7)
Immature Grans (Abs): 0 10*3/uL (ref 0.0–0.1)
Immature Granulocytes: 0 %
Lymphocytes Absolute: 1.3 10*3/uL (ref 0.7–3.1)
Lymphs: 31 %
MCH: 32.4 pg (ref 26.6–33.0)
MCHC: 34 g/dL (ref 31.5–35.7)
MCV: 96 fL (ref 79–97)
Monocytes Absolute: 0.5 10*3/uL (ref 0.1–0.9)
Monocytes: 11 %
Neutrophils Absolute: 2.1 10*3/uL (ref 1.4–7.0)
Neutrophils: 52 %
Platelets: 138 10*3/uL — ABNORMAL LOW (ref 150–450)
RBC: 4.9 x10E6/uL (ref 4.14–5.80)
RDW: 12.8 % (ref 11.6–15.4)
WBC: 4.1 10*3/uL (ref 3.4–10.8)

## 2018-12-30 LAB — TESTOSTERONE,FREE AND TOTAL
Testosterone, Free: 33.2 pg/mL — ABNORMAL HIGH (ref 6.6–18.1)
Testosterone: 1353 ng/dL — ABNORMAL HIGH (ref 264–916)

## 2018-12-30 LAB — VITAMIN D 25 HYDROXY (VIT D DEFICIENCY, FRACTURES): Vit D, 25-Hydroxy: 50.7 ng/mL (ref 30.0–100.0)

## 2019-01-04 ENCOUNTER — Other Ambulatory Visit: Payer: Self-pay

## 2019-01-04 ENCOUNTER — Ambulatory Visit (INDEPENDENT_AMBULATORY_CARE_PROVIDER_SITE_OTHER): Payer: Medicare Other | Admitting: Family Medicine

## 2019-01-04 ENCOUNTER — Encounter: Payer: Self-pay | Admitting: Family Medicine

## 2019-01-04 DIAGNOSIS — E349 Endocrine disorder, unspecified: Secondary | ICD-10-CM | POA: Diagnosis not present

## 2019-01-04 DIAGNOSIS — Z0000 Encounter for general adult medical examination without abnormal findings: Secondary | ICD-10-CM | POA: Diagnosis not present

## 2019-01-04 DIAGNOSIS — E782 Mixed hyperlipidemia: Secondary | ICD-10-CM

## 2019-01-04 DIAGNOSIS — M545 Low back pain, unspecified: Secondary | ICD-10-CM

## 2019-01-04 DIAGNOSIS — N400 Enlarged prostate without lower urinary tract symptoms: Secondary | ICD-10-CM | POA: Diagnosis not present

## 2019-01-04 DIAGNOSIS — Z8051 Family history of malignant neoplasm of kidney: Secondary | ICD-10-CM | POA: Diagnosis not present

## 2019-01-04 DIAGNOSIS — R748 Abnormal levels of other serum enzymes: Secondary | ICD-10-CM

## 2019-01-04 DIAGNOSIS — D696 Thrombocytopenia, unspecified: Secondary | ICD-10-CM | POA: Diagnosis not present

## 2019-01-04 MED ORDER — ICOSAPENT ETHYL 1 G PO CAPS: 2.0000 | ORAL_CAPSULE | Freq: Two times a day (BID) | ORAL | 3 refills | Status: DC

## 2019-01-04 NOTE — Patient Instructions (Addendum)
Recent testosterone levels were elevated. We will decrease testosterone as directed and he should have these levels repeated in 8 weeks. He should have liver function test of bilirubin and AST repeated in about 4 weeks and if these remain elevated he will need to have an ultrasound of his abdomen to include the liver. When he comes in in 8 weeks to get testosterone levels done he will need a rectal exam at that time. He should continue to practice good hand and respiratory hygiene He should continue with regular exercise and drinking lots of water We will schedule the patient for routine cardiac visit for cardiac care.  He has no specific problems with his heart but would like a routine cardiac visit. The patient will stop taking ranitidine and will change to Pepcid [facility] because of nighttime heartburn.  He will take the Pepcid AC twice daily before breakfast and supper for 4 weeks and then only an as as-needed basis. We will leave off NSAIDs He will reduce alcohol intake Because of no personal history of stroke or heart disease and no family history he will discontinue his baby aspirin.

## 2019-01-04 NOTE — Addendum Note (Signed)
Addended by: Zannie Cove on: 01/04/2019 01:48 PM   Modules accepted: Orders

## 2019-01-04 NOTE — Progress Notes (Signed)
Virtual Visit Via telephone Note I connected with@ on 01/04/19 by telephone and verified that I am speaking with the correct person or authorized healthcare agent using two identifiers. [name] is currently located at home and there are no unauthorized people in close proximity. I completed this visit while in a private location in my home .  This visit type was conducted due to national recommendations for restrictions regarding the COVID-19 Pandemic (e.g. social distancing).  This format is felt to be most appropriate for this patient at this time.  All issues noted in this document were discussed and addressed.  No physical exam was performed.    I discussed the limitations, risks, security and privacy concerns of performing an evaluation and management service by telephone and the availability of in person appointments. I also discussed with the patient that there may be a patient responsible charge related to this service. The patient expressed understanding and agreed to proceed.   Date:  01/04/2019    ID:  [name]      29-Jun-1951        [id number]   Patient Care Team Patient Care Team: Chipper Herb, MD as PCP - General (Family Medicine)  Reason for Visit: Primary Care Follow-up     History of Present Illness & Review of Systems:     [name] is a 68 y.o. year old male primary care patient that presents today for a telehealth visit.  The patient is pleasant and doing well.  His weight is running about 189.  He denies any chest pain pressure tightness or shortness of breath.  He does describe some indigestion and heartburn at times.  He is reduced his alcohol intake and has avoided taking NSAIDs just recently.  He is passing his water well other than it being slow but has not seen any blood in the urine.  His recent lab work has been reviewed by the nurse in the office.  He understands that.  His recent testosterone levels were elevated his PSA was normal and  his hemoglobin was good and not too high.  We will reduce the milligrams of testosterone injections and recheck another testosterone level in about 8 weeks.  At that time he will need a rectal exam.  2 liver function tests were elevated and his liver function test will be repeated in about 4 weeks and if they remain elevated he will need an ultrasound of the l abdomen to include the liver.  Review of systems as stated, otherwise negative.  The patient does not have symptoms concerning for COVID-19 infection (fever, chills, cough, or new shortness of breath).      Current Medications (Verified) Allergies as of 01/04/2019   No Known Allergies     Medication List       Accurate as of January 04, 2019  8:20 AM. If you have any questions, ask your nurse or doctor.        aspirin 81 MG tablet Take 81 mg by mouth daily.   cholecalciferol 1000 units tablet Commonly known as:  VITAMIN D Take 2,000 Units by mouth 2 (two) times daily.   fish oil-omega-3 fatty acids 1000 MG capsule Take 2 g by mouth 2 (two) times daily.   OVER THE COUNTER MEDICATION Anastrozole, turmeric, glucosamine chondroitin   rosuvastatin 10 MG tablet Commonly known as:  CRESTOR Take 1 tablet (10 mg total) by mouth daily.   testosterone cypionate 200 MG/ML injection  Commonly known as:  DEPOTESTOSTERONE CYPIONATE Inject 0.75 mLs (150 mg total) into the muscle once a week.           Allergies (Verified)    Patient has no known allergies.  Past Medical History Past Medical History:  Diagnosis Date   Actinic keratosis    Arthritis    hip , shoulders    Diverticulitis    GERD (gastroesophageal reflux disease)    Hyperlipidemia    Osteoarthritis    left Hip   Pneumonia    hx of years ago    Polyp, sigmoid colon      Past Surgical History:  Procedure Laterality Date   COLONOSCOPY  10/10/2006   SHOULDER ARTHROSCOPY  11/2017   TOTAL HIP ARTHROPLASTY  10/01/2011   Procedure: TOTAL HIP  ARTHROPLASTY ANTERIOR APPROACH;  Surgeon: Mauri Pole, MD;  Location: [facility];  Service: Orthopedics;  Laterality: Left;    Social History   Socioeconomic History   Marital status: Married    Spouse name: Not on file   Number of children: 2   Years of education: 16   Highest education level: Bachelor's degree (e.g., BA, AB, BS)  Occupational History   Occupation: Teacher, 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 - past week   Sexual activity: Yes  Lifestyle   Physical activity:    Days per week: 5 days    Minutes per session: 60 min   Stress: Not at all  Relationships   Social connections:    Talks on phone: More than three times a week    Gets together: More than three times a week    Attends religious service: More than 4 times per year    Active member of club or organization: No    Attends meetings of clubs or organizations: Never    Relationship status: Married  Other Topics Concern   Not on file  Social History Narrative   Daily caffeine      Family History  Problem Relation Age of Onset   Kidney cancer Mother 35   Renal cancer Mother    Alzheimer's disease Father 62   Bipolar disorder Son    Stroke Paternal Uncle    Heart disease Paternal Grandmother    Colon cancer Neg Hx    Esophageal cancer Neg Hx    Rectal cancer Neg Hx    Stomach cancer Neg Hx    Liver cancer Neg Hx     Pancreatic cancer Neg Hx    Prostate cancer Neg Hx       Labs/Other Tests and Data Reviewed:    Wt Readings from Last 3 Encounters:  07/01/18 193 lb (87.5 kg)  02/18/18 194 lb (88 kg)  12/22/17 197 lb (  89.4 kg)   Temp Readings from Last 3 Encounters:  07/01/18 (!) 97.3 F (36.3 C) (Oral)  12/22/17 97.6 F (36.4 C) (Oral)  11/05/17 98.2 F (36.8 C)   BP Readings from Last 3 Encounters:  07/01/18 115/64  02/18/18 118/81  12/22/17 104/65   Pulse Readings from Last 3 Encounters:  07/01/18 61  02/18/18 (!) 55  12/22/17 63     Lab Results  Component Value Date   HGBA1C 5.2 04/17/2015   HGBA1C 5.3% 10/03/2014   HGBA1C 5.2 03/07/2014   Lab Results  Component Value Date   LDLCALC 58 12/29/2018   CREATININE 1.08 12/29/2018       Chemistry      Component Value Date/Time   NA 140 12/29/2018 0820   K 4.4 12/29/2018 0820   CL 104 12/29/2018 0820   CO2 21 12/29/2018 0820   BUN 20 12/29/2018 0820   CREATININE 1.08 12/29/2018 0820   CREATININE 1.08 11/23/2012 0828      Component Value Date/Time   CALCIUM 9.3 12/29/2018 0820   ALKPHOS 56 12/29/2018 0820   AST 44 (H) 12/29/2018 0820   ALT 30 12/29/2018 0820   BILITOT 1.6 (H) 12/29/2018 0820         OBSERVATIONS/ OBJECTIVE:     Patient does not check blood pressures at home.  His weight is about 189.  He has not been exercising as much because of COVID-19.  He does not check blood pressures but will get an [employer] monitor to start doing this.  He is still active physically outside.  He recently had problems with his back and did see Dr. being and got a round of prednisone which has helped and has not had any recurrence of back pain.  Physical exam deferred due to nature of telephonic visit.  ASSESSMENT & PLAN    Time:   Today, I have spent 30 minutes with the patient via telephone discussing the above including Covid precautions.     Visit Diagnoses: 1. Elevated liver enzymes -Liver function test in 4 weeks  and if they remain elevated get ultrasound of abdomen to include liver.  Patient has already left off NSAIDs.  2. Benign prostatic hyperplasia, unspecified whether lower urinary tract symptoms present -Patient has BPH and says that his stream is slow but otherwise having no trouble passing his water.  His recent PSA was good and testosterone levels were elevated and he will reduce his testosterone injections and have his testosterone level repeated in about 8 weeks.  3. Family history of renal cell carcinoma -Periodic ultrasounds of abdomen for follow-up if needed  4. Testosterone deficiency -The total and free direct testosterone levels were recently elevated and the patient will decrease his amount of injection and have his testosterone level repeated in about [redacted] weeks along with a rectal exam at that time  5. Low back pain, unspecified back pain laterality, unspecified chronicity, unspecified whether sciatica present -Continue to follow-up with orthopedic surgeon  6. Thrombocytopenia ([place]) -No history of any bleeding issues.  7. Mixed hyperlipidemia -Recent cholesterol numbers were good.  Patient would like to try Vascepa at 2 g twice daily and if it is too expensive he will go back to the over-the-counter fish oil.  8.  GERD -Discontinue ranitidine and take Pepcid AC twice daily for 1 month before breakfast and supper on a regular basis and then as needed  9.  Elevated liver function test -Repeat liver function test in 4 weeks.  If liver function  tests are still elevated at that time get ultrasound of abdomen to include liver  Patient Instructions  Recent testosterone levels were elevated. We will decrease testosterone as directed and he should have these levels repeated in 8 weeks. He should have liver function test of bilirubin and AST repeated in about 4 weeks and if these remain elevated he will need to have an ultrasound of his abdomen to include the liver. When he comes in in 8  weeks to get testosterone levels done he will need a rectal exam at that time. He should continue to practice good hand and respiratory hygiene He should continue with regular exercise and drinking lots of water We will schedule the patient for routine cardiac visit for cardiac care.  He has no specific problems with his heart but would like a routine cardiac visit. The patient will stop taking ranitidine and will change to Pepcid [facility] because of nighttime heartburn.  He will take the Pepcid AC twice daily before breakfast and supper for 4 weeks and then only an as as-needed basis. We will leave off NSAIDs He will reduce alcohol intake Because of no personal history of stroke or heart disease and no family history he will discontinue his baby aspirin.       The above assessment and management plan was discussed with the patient. The patient verbalized understanding of and has agreed to the management plan. Patient is aware to call the clinic if symptoms persist or worsen. Patient is aware when to return to the clinic for a follow-up visit. Patient educated on when it is appropriate to go to the emergency department.    Chipper Herb, MD [address], [place], [place] [address] Ph [phone]   Arrie Senate MD

## 2019-02-02 ENCOUNTER — Other Ambulatory Visit: Payer: Medicare Other

## 2019-02-02 ENCOUNTER — Other Ambulatory Visit: Payer: Self-pay

## 2019-02-02 DIAGNOSIS — E349 Endocrine disorder, unspecified: Secondary | ICD-10-CM | POA: Diagnosis not present

## 2019-02-02 DIAGNOSIS — R748 Abnormal levels of other serum enzymes: Secondary | ICD-10-CM

## 2019-02-02 NOTE — Addendum Note (Signed)
Addended by: Zannie Cove on: 02/02/2019 04:26 PM   Modules accepted: Orders

## 2019-02-03 ENCOUNTER — Other Ambulatory Visit: Payer: Self-pay | Admitting: Family Medicine

## 2019-02-03 LAB — TESTOSTERONE,FREE AND TOTAL
Testosterone, Free: 24.2 pg/mL — ABNORMAL HIGH (ref 6.6–18.1)
Testosterone: 1064 ng/dL — ABNORMAL HIGH (ref 264–916)

## 2019-02-03 LAB — HEPATIC FUNCTION PANEL
ALT: 29 IU/L (ref 0–44)
AST: 28 IU/L (ref 0–40)
Albumin: 4.6 g/dL (ref 3.8–4.8)
Alkaline Phosphatase: 59 IU/L (ref 39–117)
Bilirubin Total: 0.9 mg/dL (ref 0.0–1.2)
Bilirubin, Direct: 0.24 mg/dL (ref 0.00–0.40)
Total Protein: 7.2 g/dL (ref 6.0–8.5)

## 2019-03-23 ENCOUNTER — Ambulatory Visit: Payer: Medicare Other | Admitting: Cardiology

## 2019-04-21 ENCOUNTER — Other Ambulatory Visit: Payer: Self-pay

## 2019-04-21 ENCOUNTER — Encounter: Payer: Self-pay | Admitting: Cardiology

## 2019-04-21 ENCOUNTER — Ambulatory Visit (INDEPENDENT_AMBULATORY_CARE_PROVIDER_SITE_OTHER): Payer: Medicare Other | Admitting: Cardiology

## 2019-04-21 VITALS — BP 142/76 | HR 57 | Temp 98.8°F | Ht 72.0 in | Wt 195.6 lb

## 2019-04-21 DIAGNOSIS — Z7189 Other specified counseling: Secondary | ICD-10-CM | POA: Diagnosis not present

## 2019-04-21 DIAGNOSIS — E7849 Other hyperlipidemia: Secondary | ICD-10-CM | POA: Diagnosis not present

## 2019-04-21 DIAGNOSIS — R030 Elevated blood-pressure reading, without diagnosis of hypertension: Secondary | ICD-10-CM | POA: Diagnosis not present

## 2019-04-21 NOTE — Patient Instructions (Signed)
Medication Instructions:  Your Physician recommend you continue on your current medication as directed.    If you need a refill on your cardiac medications before your next appointment, please call your pharmacy.   Lab work: None  Testing/Procedures: None  Follow-Up: At [facility], you and your health needs are our priority.  As part of our continuing mission to provide you with exceptional heart care, we have created designated Provider Care Teams.  These Care Teams include your primary Cardiologist (physician) and Advanced Practice Providers (APPs -  Physician Assistants and Nurse Practitioners) who all work together to provide you with the care you need, when you need it. You will need a follow up appointment in 2 years.  Please call our office 2 months in advance to schedule this appointment.  You may see Dr. Harrell Gave or one of the following Advanced Practice Providers on your designated Care Team:   Rosaria Ferries, PA-C . Jory Sims, DNP, ANP

## 2019-04-21 NOTE — Progress Notes (Signed)
Cardiology Office Note:    Date:  04/21/2019   ID:  [name], DOB [birth year], MRN [id number]  PCP:  Chipper Herb, MD (Inactive)  Cardiologist:  Buford Dresser, MD PhD  Referring MD: Chipper Herb, MD   CC: new patient evaluation for hyperlipidemia/cardiovascular risk  History of Present Illness:    [name] is a 68 y.o. male with a hx of HLD, joint pain who is seen as a new consult at the request of Chipper Herb, MD for the evaluation and management of hyperlipidemia and cardiovascular risk.  Patient not sure why he was referred here, no concerns or complaints related to his heart today.   Cardiovascular risk factors: Prior clinical ASCVD: none Comorbid conditions: HLD only. Has low testosterone medically, on weekly IM shots. Metabolic syndrome/Obesity: BMI 26 Chronic inflammatory conditions: none Tobacco use history: 1ppd for several years, quit in 1995. No cravings. Family history: grandmother and grandfather had late in life heart disease, died in their 55s/90s. Father had Alzheimer's, mother had renal cell carcinoma, both deceased. Prior cardiac testing and/or incidental findings on other testing (ie coronary calcium): none (no prior CT scans of chest Exercise level: goes to the gym daily or at minimum 5x/week. Rides bike, lifts weights. Can go up to 10 miles on bike, works around the house, [employer] without issues.  Current diet: overall good. Wife is a good cook, eats a lot of chicken and fish. Salads every night, almost no fried food.  Denies chest pain, shortness of breath at rest or with normal exertion. No PND, orthopnea, LE edema or unexpected weight gain. No syncope or palpitations.  We spent significant time today reviewing guideline recommendations and evidence for multiple topics, including hyperlipidemia, vascepa indication, testosterone, prostate cancer screening/treatment (in general, referred details to PCP), and covid, especially related to  scientific evidence for/against hydroxychloroquine.  Past Medical History:  Diagnosis Date  . Actinic keratosis   . Arthritis    hip , shoulders   . Diverticulitis   . GERD (gastroesophageal reflux disease)   . Hyperlipidemia   . Osteoarthritis    left Hip  . Pneumonia    hx of years ago   . Polyp, sigmoid colon   . Testosterone deficiency 2012    Past Surgical History:  Procedure Laterality Date  . COLONOSCOPY  10/10/2006  . SHOULDER ARTHROSCOPY  11/2017  . 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 cholecalciferol (VITAMIN D) 1000 UNITS tablet Take 2,000 Units by mouth 2 (two) times daily.   . fish oil-omega-3 fatty acids 1000 MG capsule Take 2 capsules (2000 mg total) by mouth every morning and take 1 capsule (1000 mg total) by mouth every afternoon.  [place] Kitchen OVER THE COUNTER MEDICATION Anastrozole, turmeric, glucosamine chondroitin  . rosuvastatin (CRESTOR) 10 MG tablet TAKE 1 TABLET BY MOUTH EVERY DAY  . testosterone cypionate (DEPOTESTOSTERONE CYPIONATE) 200 MG/ML injection Inject 0.75 mLs (150 mg total) into the muscle once a week. (Patient taking differently: Inject 100 mg into the muscle once a week. )   No current facility-administered medications on file prior to visit.      Allergies:   Patient has no known allergies.   Social History   Socioeconomic History  . Marital status: Married  Spouse name: Not on file  . Number of children: 2  . Years of education: 16  . Highest education level: Bachelor's degree (e.g., BA, AB, BS)  Occupational History  . 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 Financial resource strain: Not hard at all  . Food insecurity    Worry: Never true    Inability: Never true  . Transportation  needs    Medical: No    Non-medical: No  Tobacco Use  . Smoking status: Former Smoker    Packs/day: 1.00    Types: Cigarettes    Start date: 04/02/1983    Quit date: 04/01/1988    Years since quitting: 31.0  . Smokeless tobacco: Never Used  Substance and Sexual Activity  . Alcohol use: Yes    Alcohol/week: 7.0 standard drinks    Types: 7 Glasses of wine per week  . Drug use: Yes    Types: Marijuana    Comment: occ. - past week  . Sexual activity: Yes  Lifestyle  . Physical activity    Days per week: 5 days    Minutes per session: 60 min  . Stress: Not at all  Relationships  . Social connections    Talks on phone: More than three times a week    Gets together: More than three times a week    Attends religious service: More than 4 times per year    Active member of club or organization: No    Attends meetings of clubs or organizations: Never    Relationship status: Married  Other Topics Concern  . Not on file  Social History Narrative   Daily caffeine      Family History: The patient's family history includes Alzheimer's disease (age of onset: 64) in his father; Bipolar disorder in his son; Heart disease in his paternal grandmother; Kidney cancer (age of onset: 29) in his mother; Renal cancer in his mother; Stroke in his paternal uncle. There is no history of Colon cancer, Esophageal cancer, Rectal cancer, Stomach cancer, Liver cancer, Pancreatic cancer, or Prostate cancer.  ROS:   Please see the history of present illness.  Additional pertinent ROS: Constitutional: Negative for chills, fever, night sweats, unintentional weight loss  HENT: Negative for ear pain and hearing loss.   Eyes: Negative for loss of vision and eye pain.  Respiratory: Negative for cough, sputum, wheezing.   Cardiovascular: See HPI. Gastrointestinal: Negative for abdominal pain, melena, and hematochezia.  Genitourinary: Negative for dysuria and hematuria.  Musculoskeletal: Negative for falls and  myalgias.  Skin: Negative for itching and rash.  Neurological: Negative for focal weakness, focal sensory changes and loss of consciousness.  Endo/Heme/Allergies: Does not bruise/bleed easily.     EKGs/Labs/Other Studies Reviewed:    The following studies were reviewed today: No prior cardiac studies  EKG:  EKG is personally reviewed.  The ekg ordered today demonstrates sinus bradycardia with 1st degree AV block, HR 57 BPM  Recent Labs: 12/29/2018: BUN 20; Creatinine, Ser 1.08; Hemoglobin 15.9; Platelets 138; Potassium 4.4; Sodium 140 02/02/2019: ALT 29  Recent Lipid Panel    Component Value Date/Time   CHOL 128 12/29/2018 0820   CHOL 138 11/23/2012 0828   TRIG 78 12/29/2018 0820   TRIG 75 06/20/2017 1655   TRIG 140 11/23/2012 0828   HDL 54 12/29/2018 0820   HDL 52 06/20/2017 1655   HDL 44 11/23/2012 0828   CHOLHDL 2.4 12/29/2018 0820   LDLCALC 58 12/29/2018 0820  LDLCALC 69 03/07/2014 0815   LDLCALC 66 11/23/2012 0828    Physical Exam:    VS:  BP (!) 142/76   Pulse (!) 57   Temp 98.8 F (37.1 C) (Temporal)   Ht 6' (1.829 m)   Wt 195 lb 9.6 oz (88.7 kg)   SpO2 98%   BMI 26.53 kg/m     Wt Readings from Last 3 Encounters:  04/21/19 195 lb 9.6 oz (88.7 kg)  07/01/18 193 lb (87.5 kg)  02/18/18 194 lb (88 kg)    GEN: Well nourished, well developed in no acute distress HEENT: Normal, moist mucous membranes NECK: No JVD CARDIAC: regular rhythm, normal S1 and S2, no murmurs, rubs, gallops.  VASCULAR: Radial and DP pulses 2+ bilaterally. No carotid bruits RESPIRATORY:  Clear to auscultation without rales, wheezing or rhonchi  ABDOMEN: Soft, non-tender, non-distended MUSCULOSKELETAL:  Ambulates independently SKIN: Warm and dry, no edema NEUROLOGIC:  Alert and oriented x 3. No focal neuro deficits noted. PSYCHIATRIC:  Normal affect    ASSESSMENT:    1. Other hyperlipidemia   2. Cardiac risk counseling   3. Counseling on health promotion and disease prevention    4. Educated About Covid-19 Virus Infection   5. Elevated blood pressure reading    PLAN:    History of hyperlipidemia: excellent control. No increased risk factors (did discuss testosterone use, lack of clear data in appropriate testosterone deficiency and relationship to CAD). -discussed current indications for vascepa, which he does not meet -discussed lack of CV efficacy data for fish oil -doing well on rosuvastatin, discussed this is best prevention medication long term for ASCVD -excellent diet/exercise  Elevated BP reading: denies history of hypertension, on no meds. Prior readings at goal. Monitor for now, will hold on starting med for single elevated reading  Covid-19 education: discussed evidence to date, at length  Cardiac risk counseling and prevention recommendations: -recommend heart healthy/Mediterranean diet, with whole grains, fruits, vegetable, fish, lean meats, nuts, and olive oil. Limit salt. -recommend moderate walking, 3-5 times/week for 30-50 minutes each session. Aim for at least 150 minutes.week. Goal should be pace of 3 miles/hours, or walking 1.5 miles in 30 minutes -recommend avoidance of tobacco products. Avoid excess alcohol.  Plan for follow up: 2 years or sooner PRN  Medication Adjustments/Labs and Tests Ordered: Current medicines are reviewed at length with the patient today.  Concerns regarding medicines are outlined above.  Orders Placed This Encounter  Procedures  . EKG 12-Lead   No orders of the defined types were placed in this encounter.   Patient Instructions  Medication Instructions:  Your Physician recommend you continue on your current medication as directed.    If you need a refill on your cardiac medications before your next appointment, please call your pharmacy.   Lab work: None  Testing/Procedures: None  Follow-Up: At [facility], you and your health needs are our priority.  As part of our continuing mission to provide you  with exceptional heart care, we have created designated Provider Care Teams.  These Care Teams include your primary Cardiologist (physician) and Advanced Practice Providers (APPs -  Physician Assistants and Nurse Practitioners) who all work together to provide you with the care you need, when you need it. You will need a follow up appointment in 2 years.  Please call our office 2 months in advance to schedule this appointment.  You may see Dr. Harrell Gave or one of the following Advanced Practice Providers on your designated Care Team:  Rhonda Barrett, PA-C . Jory Sims, DNP, ANP       Signed, Buford Dresser, MD PhD 04/21/2019 1:16 PM    [place]

## 2019-05-20 DIAGNOSIS — L570 Actinic keratosis: Secondary | ICD-10-CM | POA: Diagnosis not present

## 2019-05-20 DIAGNOSIS — D225 Melanocytic nevi of trunk: Secondary | ICD-10-CM | POA: Diagnosis not present

## 2019-05-20 DIAGNOSIS — Z85828 Personal history of other malignant neoplasm of skin: Secondary | ICD-10-CM | POA: Diagnosis not present

## 2019-05-20 DIAGNOSIS — L821 Other seborrheic keratosis: Secondary | ICD-10-CM | POA: Diagnosis not present

## 2019-05-20 DIAGNOSIS — L814 Other melanin hyperpigmentation: Secondary | ICD-10-CM | POA: Diagnosis not present

## 2019-05-31 DIAGNOSIS — H5203 Hypermetropia, bilateral: Secondary | ICD-10-CM | POA: Diagnosis not present

## 2019-05-31 DIAGNOSIS — H259 Unspecified age-related cataract: Secondary | ICD-10-CM | POA: Diagnosis not present

## 2019-05-31 DIAGNOSIS — H1789 Other corneal scars and opacities: Secondary | ICD-10-CM | POA: Diagnosis not present

## 2019-05-31 DIAGNOSIS — H18899 Other specified disorders of cornea, unspecified eye: Secondary | ICD-10-CM | POA: Diagnosis not present

## 2019-05-31 DIAGNOSIS — H40013 Open angle with borderline findings, low risk, bilateral: Secondary | ICD-10-CM | POA: Diagnosis not present

## 2019-06-02 ENCOUNTER — Telehealth: Payer: Self-pay | Admitting: Family Medicine

## 2019-06-02 NOTE — Telephone Encounter (Signed)
Appt made with [facility]

## 2019-06-14 ENCOUNTER — Other Ambulatory Visit: Payer: Self-pay | Admitting: *Deleted

## 2019-06-14 ENCOUNTER — Other Ambulatory Visit: Payer: Self-pay | Admitting: Family Medicine

## 2019-06-14 DIAGNOSIS — E349 Endocrine disorder, unspecified: Secondary | ICD-10-CM

## 2019-06-14 MED ORDER — TESTOSTERONE CYPIONATE 200 MG/ML IM SOLN: 100.0000 mg | INTRAMUSCULAR | 1 refills | Status: DC

## 2019-06-17 ENCOUNTER — Other Ambulatory Visit: Payer: Self-pay

## 2019-06-18 ENCOUNTER — Ambulatory Visit (INDEPENDENT_AMBULATORY_CARE_PROVIDER_SITE_OTHER): Payer: Medicare Other

## 2019-06-18 DIAGNOSIS — E349 Endocrine disorder, unspecified: Secondary | ICD-10-CM

## 2019-06-18 MED ORDER — TESTOSTERONE CYPIONATE 200 MG/ML IM SOLN
100.0000 mg | Freq: Once | INTRAMUSCULAR | Status: AC
  Administered 2019-06-18: 100 mg via INTRAMUSCULAR

## 2019-06-18 NOTE — Progress Notes (Signed)
Testosterone injection given to left upper outer quadrant.  Patient tolerated well. 

## 2019-06-23 ENCOUNTER — Other Ambulatory Visit: Payer: Self-pay

## 2019-06-24 ENCOUNTER — Other Ambulatory Visit: Payer: Self-pay

## 2019-06-25 ENCOUNTER — Ambulatory Visit (INDEPENDENT_AMBULATORY_CARE_PROVIDER_SITE_OTHER): Payer: Medicare Other

## 2019-06-25 DIAGNOSIS — E349 Endocrine disorder, unspecified: Secondary | ICD-10-CM

## 2019-06-25 MED ORDER — TESTOSTERONE CYPIONATE 200 MG/ML IM SOLN
100.0000 mg | Freq: Once | INTRAMUSCULAR | Status: AC
  Administered 2019-06-25: 100 mg via INTRAMUSCULAR

## 2019-06-25 NOTE — Progress Notes (Signed)
Testosterone injection given to right upper outer quadrant.  Patient tolerated well. 

## 2019-07-15 ENCOUNTER — Other Ambulatory Visit: Payer: Self-pay

## 2019-07-16 ENCOUNTER — Ambulatory Visit (INDEPENDENT_AMBULATORY_CARE_PROVIDER_SITE_OTHER): Payer: Medicare Other

## 2019-07-16 DIAGNOSIS — E349 Endocrine disorder, unspecified: Secondary | ICD-10-CM

## 2019-07-16 MED ORDER — TESTOSTERONE CYPIONATE 200 MG/ML IM SOLN
100.0000 mg | INTRAMUSCULAR | Status: DC
  Administered 2019-07-16: 100 mg via INTRAMUSCULAR

## 2019-07-16 NOTE — Progress Notes (Signed)
Testosterone injection to left upper outer quadrant.  Patient tolerated well.

## 2019-08-13 ENCOUNTER — Other Ambulatory Visit: Payer: Self-pay | Admitting: Family Medicine

## 2019-08-31 ENCOUNTER — Other Ambulatory Visit: Payer: Medicare Other

## 2019-08-31 ENCOUNTER — Other Ambulatory Visit: Payer: Self-pay | Admitting: *Deleted

## 2019-08-31 ENCOUNTER — Other Ambulatory Visit: Payer: Self-pay

## 2019-08-31 DIAGNOSIS — E782 Mixed hyperlipidemia: Secondary | ICD-10-CM

## 2019-08-31 DIAGNOSIS — N400 Enlarged prostate without lower urinary tract symptoms: Secondary | ICD-10-CM

## 2019-08-31 DIAGNOSIS — R748 Abnormal levels of other serum enzymes: Secondary | ICD-10-CM | POA: Diagnosis not present

## 2019-08-31 DIAGNOSIS — D696 Thrombocytopenia, unspecified: Secondary | ICD-10-CM

## 2019-08-31 DIAGNOSIS — E349 Endocrine disorder, unspecified: Secondary | ICD-10-CM

## 2019-08-31 DIAGNOSIS — E7800 Pure hypercholesterolemia, unspecified: Secondary | ICD-10-CM

## 2019-09-01 ENCOUNTER — Other Ambulatory Visit: Payer: Self-pay

## 2019-09-02 ENCOUNTER — Encounter: Payer: Self-pay | Admitting: Family Medicine

## 2019-09-02 ENCOUNTER — Ambulatory Visit (INDEPENDENT_AMBULATORY_CARE_PROVIDER_SITE_OTHER): Payer: Medicare Other | Admitting: Family Medicine

## 2019-09-02 VITALS — BP 132/80 | HR 66 | Temp 98.6°F | Resp 20 | Ht 72.0 in | Wt 197.0 lb

## 2019-09-02 DIAGNOSIS — E349 Endocrine disorder, unspecified: Secondary | ICD-10-CM | POA: Diagnosis not present

## 2019-09-02 DIAGNOSIS — E782 Mixed hyperlipidemia: Secondary | ICD-10-CM

## 2019-09-02 DIAGNOSIS — D696 Thrombocytopenia, unspecified: Secondary | ICD-10-CM

## 2019-09-02 DIAGNOSIS — Z23 Encounter for immunization: Secondary | ICD-10-CM

## 2019-09-02 DIAGNOSIS — E559 Vitamin D deficiency, unspecified: Secondary | ICD-10-CM

## 2019-09-02 MED ORDER — TESTOSTERONE CYPIONATE 200 MG/ML IM SOLN: 100.0000 mg | INTRAMUSCULAR | 1 refills | Status: DC

## 2019-09-02 MED ORDER — ANASTROZOLE 1 MG PO TABS: ORAL_TABLET | ORAL | 7 refills | Status: DC

## 2019-09-02 MED ORDER — ROSUVASTATIN CALCIUM 10 MG PO TABS: 10.0000 mg | ORAL_TABLET | Freq: Every day | ORAL | 3 refills | Status: DC

## 2019-09-02 NOTE — Progress Notes (Signed)
Subjective:  Patient ID: [name], male    DOB: Jun 02, 1951, 69 y.o.   MRN: [id number]  Patient Care Team: Baruch Gouty, FNP as PCP - General (Family Medicine)   Chief Complaint:  Medical Management of Chronic Issues (TEST def )   HPI: [name] is a 69 y.o. male presenting on 09/02/2019 for Medical Management of Chronic Issues (TEST def )   1. Mixed hyperlipidemia Compliant with medications - Yes Current medications - fish-oil and crestor Side effects from medications - No Diet - generally healthy Exercise - daily  Lab Results  Component Value Date   CHOL 137 08/31/2019   HDL 46 08/31/2019   LDLCALC 69 08/31/2019   TRIG 122 08/31/2019   CHOLHDL 3.0 08/31/2019     Family and personal medical history reviewed. Smoking and ETOH history reviewed.    2. Thrombocytopenia (HCC) No abnormal bleeding or bruising. No weakness, fatigue, or pallor. No petechial or purpura rash.   3. Testosterone deficiency On repletion therapy and estrogen reduction medication. Tolerating well. No icterus, pruritis, mood swings, agitation, hypertension, or headache.   4. Vitamin D deficiency Pt is taking oral repletion therapy. Denies bone pain and tenderness, muscle weakness, fracture, and difficulty walking. Lab Results  Component Value Date   VD25OH 50.7 12/29/2018   VD25OH 47.6 06/29/2018   VD25OH 64.0 12/18/2017   Lab Results  Component Value Date   CALCIUM 9.8 08/31/2019      Relevant past medical, surgical, family, and social history reviewed and updated as indicated.  Allergies and medications reviewed and updated. Date reviewed: Chart in Epic.   Past Medical History:  Diagnosis Date  . Actinic keratosis   . Arthritis    hip , shoulders   . Diverticulitis   . GERD (gastroesophageal reflux disease)   . Hyperlipidemia   . Osteoarthritis    left Hip  . Pneumonia    hx of years ago   . Polyp, sigmoid colon   . Testosterone deficiency 2012    Past  Surgical History:  Procedure Laterality Date  . COLONOSCOPY  10/10/2006  . SHOULDER ARTHROSCOPY  11/2017  . TOTAL HIP ARTHROPLASTY  10/01/2011   Procedure: TOTAL HIP ARTHROPLASTY ANTERIOR APPROACH;  Surgeon: Mauri Pole, MD;  Location: [facility];  Service: Orthopedics;  Laterality: Left;    Social History   Socioeconomic History  . Marital status: Married    Spouse name: Not on file  . Number of children: 2  . Years of education: 16  . Highest education level: Bachelor's degree (e.g., BA, AB, BS)  Occupational History  . 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 Smoking status: Former Smoker    Packs/day: 1.00    Types: Cigarettes    Start date: 04/02/1983    Quit date: 04/01/1988    Years since quitting: 31.4  . Smokeless tobacco: Never Used  Substance and Sexual Activity  . Alcohol use: Yes    Alcohol/week: 7.0 standard drinks    Types: 7 Glasses of wine per week  . Drug use: Yes    Types: Marijuana    Comment: occ. - past week  . Sexual activity: Yes  Other Topics Concern  . Not on file  Social History Narrative   Daily caffeine    Social Determinants of Health   Financial Resource Strain:   . Difficulty of Paying Living Expenses: Not on file  Food Insecurity:   . Worried About  Running Out of Food in the Last Year: Not on file  . Ran Out of Food in the Last Year: Not on file  Transportation Needs:   . Lack of Transportation (Medical): Not on file  . Lack of Transportation (Non-Medical): Not on file  Physical Activity:   . Days of Exercise per Week: Not on file  . Minutes of Exercise per Session: Not on file  Stress:   . Feeling of Stress : Not on file  Social Connections:   . Frequency of Communication with Friends and Family: Not on file  . Frequency of Social Gatherings with Friends and Family: Not on file  . Attends Religious Services: Not on file  . Active Member of Clubs or Organizations: Not on file  . Attends Archivist Meetings:  Not on file  . Marital Status: Not on file  Intimate Partner Violence:   . Fear of Current or Ex-Partner: Not on file  . Emotionally Abused: Not on file  . Physically Abused: Not on file  . Sexually Abused: Not on file    Outpatient Encounter Medications as of 09/02/2019  Medication Sig  . anastrozole (ARIMIDEX) 1 MG tablet TAKE 1/2 TABLET ONCE A WEEK  . cholecalciferol (VITAMIN D) 1000 UNITS tablet Take 2,000 Units by mouth 2 (two) times daily.   . fish oil-omega-3 fatty acids 1000 MG capsule Take 2 capsules (2000 mg total) by mouth every morning and take 1 capsule (1000 mg total) by mouth every afternoon.  Marland Kitchen OVER THE COUNTER MEDICATION Anastrozole, turmeric, glucosamine chondroitin  . rosuvastatin (CRESTOR) 10 MG tablet Take 1 tablet (10 mg total) by mouth daily.  Marland Kitchen testosterone cypionate (DEPOTESTOSTERONE CYPIONATE) 200 MG/ML injection Inject 0.5 mLs (100 mg total) into the muscle once a week.  . [DISCONTINUED] anastrozole (ARIMIDEX) 1 MG tablet TAKE 1/2 TABLET ONCE A WEEK  . [DISCONTINUED] rosuvastatin (CRESTOR) 10 MG tablet TAKE 1 TABLET BY MOUTH EVERY DAY  . [DISCONTINUED] testosterone cypionate (DEPOTESTOSTERONE CYPIONATE) 200 MG/ML injection Inject 0.5 mLs (100 mg total) into the muscle once a week.   Facility-Administered Encounter Medications as of 09/02/2019  Medication  . testosterone cypionate (DEPOTESTOSTERONE CYPIONATE) injection 100 mg    No Known Allergies  Review of Systems  Constitutional: Negative for activity change, appetite change, chills, diaphoresis, fatigue, fever and unexpected weight change.  HENT: Negative.   Eyes: Negative.  Negative for photophobia and visual disturbance.  Respiratory: Negative for cough, chest tightness and shortness of breath.   Cardiovascular: Negative for chest pain, palpitations and leg swelling.  Gastrointestinal: Negative for abdominal pain, anal bleeding, blood in stool, constipation, diarrhea, nausea and vomiting.  Endocrine:  Negative.  Negative for heat intolerance, polydipsia, polyphagia and polyuria.  Genitourinary: Negative for decreased urine volume, difficulty urinating, discharge, dysuria, flank pain, frequency, hematuria, penile pain, penile swelling, scrotal swelling, testicular pain and urgency.  Musculoskeletal: Positive for arthralgias (back, shoulders) and myalgias (left thigh). Negative for back pain, gait problem, joint swelling, neck pain and neck stiffness.  Skin: Negative.   Allergic/Immunologic: Negative.   Neurological: Negative for dizziness, tremors, seizures, syncope, facial asymmetry, speech difficulty, weakness, light-headedness, numbness and headaches.  Hematological: Negative.   Psychiatric/Behavioral: Negative for agitation, confusion, decreased concentration, dysphoric mood, hallucinations, self-injury, sleep disturbance and suicidal ideas. The patient is not nervous/anxious and is not hyperactive.   All other systems reviewed and are negative.       Objective:  BP 132/80   Pulse 66   Temp 98.6 F (37 C)  Resp 20   Ht 6' (1.829 m)   Wt 197 lb (89.4 kg)   SpO2 98%   BMI 26.72 kg/m    Wt Readings from Last 3 Encounters:  09/02/19 197 lb (89.4 kg)  04/21/19 195 lb 9.6 oz (88.7 kg)  07/01/18 193 lb (87.5 kg)    Physical Exam Vitals and nursing note reviewed.  Constitutional:      General: He is not in acute distress.    Appearance: Normal appearance. He is well-developed and well-groomed. He is not ill-appearing, toxic-appearing or diaphoretic.  HENT:     Head: Normocephalic and atraumatic.     Jaw: There is normal jaw occlusion.     Right Ear: Hearing, tympanic membrane, ear canal and external ear normal.     Left Ear: Hearing, tympanic membrane, ear canal and external ear normal.     Nose: Nose normal.     Mouth/Throat:     Lips: Pink.     Mouth: Mucous membranes are moist.     Pharynx: Oropharynx is clear. Uvula midline.  Eyes:     General: Lids are normal.      Extraocular Movements: Extraocular movements intact.     Conjunctiva/sclera: Conjunctivae normal.     Pupils: Pupils are equal, round, and reactive to light.  Neck:     Thyroid: No thyroid mass, thyromegaly or thyroid tenderness.     Vascular: No carotid bruit or JVD.     Trachea: Trachea and phonation normal.  Cardiovascular:     Rate and Rhythm: Normal rate and regular rhythm.     Chest Wall: PMI is not displaced.     Pulses: Normal pulses.     Heart sounds: Normal heart sounds. No murmur. No friction rub. No gallop.   Pulmonary:     Effort: Pulmonary effort is normal. No respiratory distress.     Breath sounds: Normal breath sounds. No wheezing.  Abdominal:     General: Bowel sounds are normal. There is no distension or abdominal bruit.     Palpations: Abdomen is soft. There is no hepatomegaly or splenomegaly.     Tenderness: There is no abdominal tenderness. There is no right CVA tenderness or left CVA tenderness.     Hernia: No hernia is present.  Musculoskeletal:        General: Normal range of motion.     Cervical back: Normal range of motion and neck supple.     Right hip: Normal.     Left hip: Normal.     Right upper leg: No swelling, edema, deformity, lacerations, tenderness or bony tenderness.     Left upper leg: No swelling, edema, deformity, lacerations, tenderness or bony tenderness.     Right knee: Normal.     Left knee: Normal.     Left lower leg: Normal.       Legs:  Lymphadenopathy:     Cervical: No cervical adenopathy.  Skin:    General: Skin is warm and dry.     Capillary Refill: Capillary refill takes less than 2 seconds.     Coloration: Skin is not cyanotic, jaundiced or pale.     Findings: No rash.  Neurological:     General: No focal deficit present.     Mental Status: He is alert and oriented to person, place, and time.     Cranial Nerves: Cranial nerves are intact. No cranial nerve deficit.     Sensory: Sensation is intact. No sensory deficit.  Motor: Motor function is intact. No weakness.     Coordination: Coordination is intact. Coordination normal.     Gait: Gait is intact. Gait normal.     Deep Tendon Reflexes: Reflexes are normal and symmetric. Reflexes normal.  Psychiatric:        Attention and Perception: Attention and perception normal.        Mood and Affect: Mood and affect normal.        Speech: Speech normal.        Behavior: Behavior normal. Behavior is cooperative.        Thought Content: Thought content normal.        Cognition and Memory: Cognition and memory normal.        Judgment: Judgment normal.     Results for orders placed or performed in visit on 08/31/19  Testosterone,Free and Total  Result Value Ref Range   Testosterone 1,132 (H) 264 - 916 ng/dL   Testosterone, Free WILL FOLLOW   Lipid panel  Result Value Ref Range   Cholesterol, Total 137 100 - 199 mg/dL   Triglycerides 122 0 - 149 mg/dL   HDL 46 >39 mg/dL   VLDL Cholesterol Cal 22 5 - 40 mg/dL   LDL Chol Calc (NIH) 69 0 - 99 mg/dL   Chol/HDL Ratio 3.0 0.0 - 5.0 ratio  CMP14+EGFR  Result Value Ref Range   Glucose 103 (H) 65 - 99 mg/dL   BUN 14 8 - 27 mg/dL   Creatinine, Ser 1.25 0.76 - 1.27 mg/dL   GFR calc non Af Amer 59 (L) >59 mL/min/1.73   GFR calc Af Amer 68 >59 mL/min/1.73   BUN/Creatinine Ratio 11 10 - 24   Sodium 140 134 - 144 mmol/L   Potassium 4.4 3.5 - 5.2 mmol/L   Chloride 103 96 - 106 mmol/L   CO2 22 20 - 29 mmol/L   Calcium 9.8 8.6 - 10.2 mg/dL   Total Protein 7.0 6.0 - 8.5 g/dL   Albumin 4.7 3.8 - 4.8 g/dL   Globulin, Total 2.3 1.5 - 4.5 g/dL   Albumin/Globulin Ratio 2.0 1.2 - 2.2   Bilirubin Total 1.2 0.0 - 1.2 mg/dL   Alkaline Phosphatase 64 39 - 117 IU/L   AST 41 (H) 0 - 40 IU/L   ALT 41 0 - 44 IU/L  CBC with Differential/Platelet  Result Value Ref Range   WBC 4.4 3.4 - 10.8 x10E3/uL   RBC 5.04 4.14 - 5.80 x10E6/uL   Hemoglobin 16.5 13.0 - 17.7 g/dL   Hematocrit 48.0 37.5 - 51.0 %   MCV 95 79 - 97 fL   MCH  32.7 26.6 - 33.0 pg   MCHC 34.4 31.5 - 35.7 g/dL   RDW 12.3 11.6 - 15.4 %   Platelets 168 150 - 450 x10E3/uL   Neutrophils 53 Not Estab. %   Lymphs 33 Not Estab. %   Monocytes 10 Not Estab. %   Eos 3 Not Estab. %   Basos 1 Not Estab. %   Neutrophils Absolute 2.3 1.4 - 7.0 x10E3/uL   Lymphocytes Absolute 1.5 0.7 - 3.1 x10E3/uL   Monocytes Absolute 0.4 0.1 - 0.9 x10E3/uL   EOS (ABSOLUTE) 0.1 0.0 - 0.4 x10E3/uL   Basophils Absolute 0.0 0.0 - 0.2 x10E3/uL   Immature Granulocytes 0 Not Estab. %   Immature Grans (Abs) 0.0 0.0 - 0.1 x10E3/uL       Pertinent labs & imaging results that were available during my care of the patient  were reviewed by me and considered in my medical decision making.  Assessment & Plan:  [name] was seen today for medical management of chronic issues.  Diagnoses and all orders for this visit:  Mixed hyperlipidemia Diet encouraged - increase intake of fresh fruits and vegetables, increase intake of lean proteins. Bake, broil, or grill foods. Avoid fried, greasy, and fatty foods. Avoid fast foods. Increase intake of fiber-rich whole grains. Exercise encouraged - at least 150 minutes per week and advance as tolerated.  Goal BMI < 25. Continue medications as prescribed. Follow up in 3-6 months as discussed.  -     rosuvastatin (CRESTOR) 10 MG tablet; Take 1 tablet (10 mg total) by mouth daily.  Thrombocytopenia (HCC) Normal platelet count along with normal RBC, Hgb, and Hct.  Will continue to monitor.   Testosterone deficiency Tolerating repletion therapy well. Will continue. Recheck in 6 months. May adjust therapy if warranted.  -     anastrozole (ARIMIDEX) 1 MG tablet; TAKE 1/2 TABLET ONCE A WEEK -     testosterone cypionate (DEPOTESTOSTERONE CYPIONATE) 200 MG/ML injection; Inject 0.5 mLs (100 mg total) into the muscle once a week.  Vitamin D deficiency Labs pending. Continue repletion therapy. If indicated, will change repletion dosage. Eat foods rich in Vit  D including milk, orange juice, yogurt with vitamin D added, salmon or mackerel, canned tuna fish, cereals with vitamin D added, and cod liver oil. Get out in the sun but make sure to wear at least SPF 30 sunscreen.   Influenza vaccine given today.    Continue all other maintenance medications.  Follow up plan: Return in about 6 months (around 03/01/2020), or if symptoms worsen or fail to improve.  Continue healthy lifestyle choices, including diet (rich in fruits, vegetables, and lean proteins, and low in salt and simple carbohydrates) and exercise (at least 30 minutes of moderate physical activity daily).   The above assessment and management plan was discussed with the patient. The patient verbalized understanding of and has agreed to the management plan. Patient is aware to call the clinic if they develop any new symptoms or if symptoms persist or worsen. Patient is aware when to return to the clinic for a follow-up visit. Patient educated on when it is appropriate to go to the emergency department.   Monia Pouch, FNP-C [facility] [phone]

## 2019-09-03 ENCOUNTER — Ambulatory Visit: Payer: Medicare Other | Admitting: Family Medicine

## 2019-09-03 LAB — CMP14+EGFR
ALT: 41 IU/L (ref 0–44)
AST: 41 IU/L — ABNORMAL HIGH (ref 0–40)
Albumin/Globulin Ratio: 2 (ref 1.2–2.2)
Albumin: 4.7 g/dL (ref 3.8–4.8)
Alkaline Phosphatase: 64 IU/L (ref 39–117)
BUN/Creatinine Ratio: 11 (ref 10–24)
BUN: 14 mg/dL (ref 8–27)
Bilirubin Total: 1.2 mg/dL (ref 0.0–1.2)
CO2: 22 mmol/L (ref 20–29)
Calcium: 9.8 mg/dL (ref 8.6–10.2)
Chloride: 103 mmol/L (ref 96–106)
Creatinine, Ser: 1.25 mg/dL (ref 0.76–1.27)
GFR calc Af Amer: 68 mL/min/{1.73_m2} (ref >59)
GFR calc non Af Amer: 59 mL/min/{1.73_m2} — ABNORMAL LOW (ref >59)
Globulin, Total: 2.3 g/dL (ref 1.5–4.5)
Glucose: 103 mg/dL — ABNORMAL HIGH (ref 65–99)
Potassium: 4.4 mmol/L (ref 3.5–5.2)
Sodium: 140 mmol/L (ref 134–144)
Total Protein: 7 g/dL (ref 6.0–8.5)

## 2019-09-03 LAB — CBC WITH DIFFERENTIAL/PLATELET
Basophils Absolute: 0 10*3/uL (ref 0.0–0.2)
Basos: 1 %
EOS (ABSOLUTE): 0.1 10*3/uL (ref 0.0–0.4)
Eos: 3 %
Hematocrit: 48 % (ref 37.5–51.0)
Hemoglobin: 16.5 g/dL (ref 13.0–17.7)
Immature Grans (Abs): 0 10*3/uL (ref 0.0–0.1)
Immature Granulocytes: 0 %
Lymphocytes Absolute: 1.5 10*3/uL (ref 0.7–3.1)
Lymphs: 33 %
MCH: 32.7 pg (ref 26.6–33.0)
MCHC: 34.4 g/dL (ref 31.5–35.7)
MCV: 95 fL (ref 79–97)
Monocytes Absolute: 0.4 10*3/uL (ref 0.1–0.9)
Monocytes: 10 %
Neutrophils Absolute: 2.3 10*3/uL (ref 1.4–7.0)
Neutrophils: 53 %
Platelets: 168 10*3/uL (ref 150–450)
RBC: 5.04 x10E6/uL (ref 4.14–5.80)
RDW: 12.3 % (ref 11.6–15.4)
WBC: 4.4 10*3/uL (ref 3.4–10.8)

## 2019-09-03 LAB — LIPID PANEL
Chol/HDL Ratio: 3 ratio (ref 0.0–5.0)
Cholesterol, Total: 137 mg/dL (ref 100–199)
HDL: 46 mg/dL (ref >39)
LDL Chol Calc (NIH): 69 mg/dL (ref 0–99)
Triglycerides: 122 mg/dL (ref 0–149)
VLDL Cholesterol Cal: 22 mg/dL (ref 5–40)

## 2019-09-03 LAB — PSA, TOTAL AND FREE
PSA, Free Pct: 19.2 %
PSA, Free: 0.23 ng/mL
Prostate Specific Ag, Serum: 1.2 ng/mL (ref 0.0–4.0)

## 2019-09-03 LAB — TESTOSTERONE,FREE AND TOTAL
Testosterone, Free: 23.7 pg/mL — ABNORMAL HIGH (ref 6.6–18.1)
Testosterone: 1132 ng/dL — ABNORMAL HIGH (ref 264–916)

## 2019-09-03 LAB — SPECIMEN STATUS REPORT

## 2019-10-21 DIAGNOSIS — M5136 Other intervertebral disc degeneration, lumbar region: Secondary | ICD-10-CM | POA: Diagnosis not present

## 2019-10-21 DIAGNOSIS — M545 Low back pain: Secondary | ICD-10-CM | POA: Diagnosis not present

## 2019-10-21 DIAGNOSIS — M419 Scoliosis, unspecified: Secondary | ICD-10-CM | POA: Diagnosis not present

## 2019-11-01 DIAGNOSIS — M545 Low back pain: Secondary | ICD-10-CM | POA: Diagnosis not present

## 2019-11-05 DIAGNOSIS — M419 Scoliosis, unspecified: Secondary | ICD-10-CM | POA: Diagnosis not present

## 2019-11-05 DIAGNOSIS — M48061 Spinal stenosis, lumbar region without neurogenic claudication: Secondary | ICD-10-CM | POA: Insufficient documentation

## 2019-11-05 DIAGNOSIS — M48062 Spinal stenosis, lumbar region with neurogenic claudication: Secondary | ICD-10-CM | POA: Diagnosis not present

## 2019-11-05 DIAGNOSIS — M545 Low back pain: Secondary | ICD-10-CM | POA: Diagnosis not present

## 2019-11-17 ENCOUNTER — Encounter: Payer: Self-pay | Admitting: *Deleted

## 2019-11-18 DIAGNOSIS — D225 Melanocytic nevi of trunk: Secondary | ICD-10-CM | POA: Diagnosis not present

## 2019-11-18 DIAGNOSIS — L820 Inflamed seborrheic keratosis: Secondary | ICD-10-CM | POA: Diagnosis not present

## 2019-11-18 DIAGNOSIS — L570 Actinic keratosis: Secondary | ICD-10-CM | POA: Diagnosis not present

## 2019-11-18 DIAGNOSIS — L814 Other melanin hyperpigmentation: Secondary | ICD-10-CM | POA: Diagnosis not present

## 2019-11-18 DIAGNOSIS — D1801 Hemangioma of skin and subcutaneous tissue: Secondary | ICD-10-CM | POA: Diagnosis not present

## 2019-11-18 DIAGNOSIS — L439 Lichen planus, unspecified: Secondary | ICD-10-CM | POA: Diagnosis not present

## 2019-11-18 DIAGNOSIS — D485 Neoplasm of uncertain behavior of skin: Secondary | ICD-10-CM | POA: Diagnosis not present

## 2019-11-18 DIAGNOSIS — L821 Other seborrheic keratosis: Secondary | ICD-10-CM | POA: Diagnosis not present

## 2019-11-23 DIAGNOSIS — M5137 Other intervertebral disc degeneration, lumbosacral region: Secondary | ICD-10-CM | POA: Insufficient documentation

## 2019-12-02 DIAGNOSIS — Z23 Encounter for immunization: Secondary | ICD-10-CM | POA: Diagnosis not present

## 2019-12-13 DIAGNOSIS — M5137 Other intervertebral disc degeneration, lumbosacral region: Secondary | ICD-10-CM | POA: Diagnosis not present

## 2019-12-21 DIAGNOSIS — M5137 Other intervertebral disc degeneration, lumbosacral region: Secondary | ICD-10-CM | POA: Diagnosis not present

## 2020-01-04 DIAGNOSIS — M5137 Other intervertebral disc degeneration, lumbosacral region: Secondary | ICD-10-CM | POA: Diagnosis not present

## 2020-01-07 ENCOUNTER — Telehealth: Payer: Self-pay | Admitting: *Deleted

## 2020-01-07 NOTE — Telephone Encounter (Signed)
PA came in for Testosterone inj 200mg  /ml  I started a PA in Cover my meds - it did not auto populate  SAYS pt unknown to plan.  I have LM for pt to call back and verify mem ID on most current card.

## 2020-01-18 NOTE — Telephone Encounter (Signed)
[email or web address]

## 2020-01-19 NOTE — Telephone Encounter (Signed)
Pt called back and says he has [facility] for pharmacy benefits but pt was able to get his testosterone at the pharmacy after they applied a discount.  His member ID is [id number] Group # [id number] RX BIN [id number] [facility] [place]  I still couldn't get it to go through on CoverMyMeds-pt states he will call his insurance and call us back.

## 2020-01-19 NOTE — Telephone Encounter (Addendum)
I called pt to verify insurance and was talking to pt and the call dropped. TTC back but number was busy.

## 2020-02-28 ENCOUNTER — Other Ambulatory Visit: Payer: Self-pay

## 2020-02-28 ENCOUNTER — Other Ambulatory Visit: Payer: Medicare Other

## 2020-02-28 DIAGNOSIS — E349 Endocrine disorder, unspecified: Secondary | ICD-10-CM

## 2020-02-28 DIAGNOSIS — N400 Enlarged prostate without lower urinary tract symptoms: Secondary | ICD-10-CM | POA: Diagnosis not present

## 2020-02-28 DIAGNOSIS — E782 Mixed hyperlipidemia: Secondary | ICD-10-CM

## 2020-02-28 DIAGNOSIS — E7800 Pure hypercholesterolemia, unspecified: Secondary | ICD-10-CM | POA: Diagnosis not present

## 2020-02-29 ENCOUNTER — Other Ambulatory Visit: Payer: Self-pay | Admitting: Family

## 2020-03-01 ENCOUNTER — Ambulatory Visit: Payer: Medicare Other | Admitting: Family Medicine

## 2020-03-01 ENCOUNTER — Ambulatory Visit (INDEPENDENT_AMBULATORY_CARE_PROVIDER_SITE_OTHER): Payer: Medicare Other | Admitting: Family

## 2020-03-01 ENCOUNTER — Other Ambulatory Visit: Payer: Self-pay

## 2020-03-01 ENCOUNTER — Encounter: Payer: Self-pay | Admitting: Family

## 2020-03-01 VITALS — BP 127/79 | HR 64 | Temp 98.0°F | Ht 72.0 in | Wt 190.6 lb

## 2020-03-01 DIAGNOSIS — E782 Mixed hyperlipidemia: Secondary | ICD-10-CM | POA: Diagnosis not present

## 2020-03-01 DIAGNOSIS — M419 Scoliosis, unspecified: Secondary | ICD-10-CM | POA: Diagnosis not present

## 2020-03-01 DIAGNOSIS — R748 Abnormal levels of other serum enzymes: Secondary | ICD-10-CM | POA: Diagnosis not present

## 2020-03-01 DIAGNOSIS — E349 Endocrine disorder, unspecified: Secondary | ICD-10-CM | POA: Diagnosis not present

## 2020-03-01 MED ORDER — TESTOSTERONE CYPIONATE 200 MG/ML IM SOLN: 100.0000 mg | INTRAMUSCULAR | 1 refills | Status: DC

## 2020-03-01 NOTE — Progress Notes (Signed)
Subjective:    Patient ID: [name], male    DOB: 09/28/1950, 69 y.o.   MRN: [id number]  Chief Complaint  Patient presents with  . Medical Management of Chronic Issues    rakes pt    PT presents to the office today to establish care with me and CPE.  He is followed by Neurosurgeon for scoliosis and spinal stenosis.   He had his labs drawn on 02/28/20.  Hyperlipidemia This is a chronic problem. The current episode started more than 1 year ago. The problem is controlled. Recent lipid tests were reviewed and are normal. Current antihyperlipidemic treatment includes statins. The current treatment provides moderate improvement of lipids. Risk factors for coronary artery disease include dyslipidemia and male sex.  Testosterone Deficiency Takes Testosterone weekly.     Review of Systems  All other systems reviewed and are negative.  Family History  Problem Relation Age of Onset  . Kidney cancer Mother 40  . Renal cancer Mother   . Alzheimer's disease Father 2  . Bipolar disorder Son   . Stroke Paternal Uncle   . Heart disease Paternal Grandmother   . Colon cancer Neg Hx   . Esophageal cancer Neg Hx   . Rectal cancer Neg Hx   . Stomach cancer Neg Hx   . Liver cancer Neg Hx   . Pancreatic cancer Neg Hx   . Prostate cancer Neg Hx    Social History   Socioeconomic History  . Marital status: Married    Spouse name: Not on file  . Number of children: 2  . Years of education: 16  . Highest education level: Bachelor's degree (e.g., BA, AB, BS)  Occupational History  . 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 Smoking status: Former Smoker    Packs/day: 1.00    Types: Cigarettes    Start date: 04/02/1983    Quit date: 04/01/1988    Years since quitting: 31.9  . Smokeless tobacco: Never Used  Vaping Use  . Vaping Use: Never used  Substance and Sexual Activity  . Alcohol use: Yes    Alcohol/week: 7.0 standard drinks    Types: 7 Glasses of wine per week    . Drug use: Yes    Types: Marijuana    Comment: occ. - past week  . Sexual activity: Yes  Other Topics Concern  . Not on file  Social History Narrative   Daily caffeine    Social Determinants of Health   Financial Resource Strain:   . Difficulty of Paying Living Expenses:   Food Insecurity:   . Worried About Charity fundraiser in the Last Year:   . Arboriculturist in the Last Year:   Transportation Needs:   . Film/video editor (Medical):   Marland Kitchen Lack of Transportation (Non-Medical):   Physical Activity:   . Days of Exercise per Week:   . Minutes of Exercise per Session:   Stress:   . Feeling of Stress :   Social Connections:   . Frequency of Communication with Friends and Family:   . Frequency of Social Gatherings with Friends and Family:   . Attends Religious Services:   . Active Member of Clubs or Organizations:   . Attends Archivist Meetings:   Marland Kitchen Marital Status:        Objective:   Physical Exam Vitals reviewed.  Constitutional:      General: He is not in acute  distress.    Appearance: He is well-developed.  HENT:     Head: Normocephalic.     Right Ear: Tympanic membrane normal.     Left Ear: Tympanic membrane normal.  Eyes:     General:        Right eye: No discharge.        Left eye: No discharge.     Pupils: Pupils are equal, round, and reactive to light.  Neck:     Thyroid: No thyromegaly.  Cardiovascular:     Rate and Rhythm: Normal rate and regular rhythm.     Heart sounds: Normal heart sounds. No murmur heard.   Pulmonary:     Effort: Pulmonary effort is normal. No respiratory distress.     Breath sounds: Normal breath sounds. No wheezing.  Abdominal:     General: Bowel sounds are normal. There is no distension.     Palpations: Abdomen is soft.     Tenderness: There is no abdominal tenderness.  Musculoskeletal:        General: No tenderness. Normal range of motion.     Cervical back: Normal range of motion and neck supple.   Skin:    General: Skin is warm and dry.     Findings: No erythema or rash.  Neurological:     Mental Status: He is alert and oriented to person, place, and time.     Cranial Nerves: No cranial nerve deficit.     Deep Tendon Reflexes: Reflexes are normal and symmetric.  Psychiatric:        Behavior: Behavior normal.        Thought Content: Thought content normal.        Judgment: Judgment normal.       BP 127/79   Pulse 64   Temp 98 F (36.7 C) (Temporal)   Ht 6' (1.829 m)   Wt 190 lb 9.6 oz (86.5 kg)   SpO2 96%   BMI 25.85 kg/m      Assessment & Plan:  HASHIM EICHHORST comes in today with chief complaint of Medical Management of Chronic Issues (rakes pt )   Diagnosis and orders addressed:  1. Scoliosis, unspecified scoliosis type, unspecified spinal region  2. Mixed hyperlipidemia  3. Testosterone deficiency - testosterone cypionate (DEPOTESTOSTERONE CYPIONATE) 200 MG/ML injection; Inject 0.5 mLs (100 mg total) into the muscle once a week.  Dispense: 10 mL; Refill: 1   4. Elevated liver enzymes Limit alcohol and tylenol    Labs reviewed  Health Maintenance reviewed Diet and exercise encouraged  Follow up plan: 6 months    Evelina Dun, FNP

## 2020-03-01 NOTE — Patient Instructions (Signed)
Health Maintenance After Age 69 After age 69, you are at a higher risk for certain long-term diseases and infections as well as injuries from falls. Falls are a major cause of broken bones and head injuries in people who are older than age 69. Getting regular preventive care can help to keep you healthy and well. Preventive care includes getting regular testing and making lifestyle changes as recommended by your health care provider. Talk with your health care provider about:  Which screenings and tests you should have. A screening is a test that checks for a disease when you have no symptoms.  A diet and exercise plan that is right for you. What should I know about screenings and tests to prevent falls? Screening and testing are the best ways to find a health problem early. Early diagnosis and treatment give you the best chance of managing medical conditions that are common after age 69. Certain conditions and lifestyle choices may make you more likely to have a fall. Your health care provider may recommend:  Regular vision checks. Poor vision and conditions such as cataracts can make you more likely to have a fall. If you wear glasses, make sure to get your prescription updated if your vision changes.  Medicine review. Work with your health care provider to regularly review all of the medicines you are taking, including over-the-counter medicines. Ask your health care provider about any side effects that may make you more likely to have a fall. Tell your health care provider if any medicines that you take make you feel dizzy or sleepy.  Osteoporosis screening. Osteoporosis is a condition that causes the bones to get weaker. This can make the bones weak and cause them to break more easily.  Blood pressure screening. Blood pressure changes and medicines to control blood pressure can make you feel dizzy.  Strength and balance checks. Your health care provider may recommend certain tests to check your  strength and balance while standing, walking, or changing positions.  Foot health exam. Foot pain and numbness, as well as not wearing proper footwear, can make you more likely to have a fall.  Depression screening. You may be more likely to have a fall if you have a fear of falling, feel emotionally low, or feel unable to do activities that you used to do.  Alcohol use screening. Using too much alcohol can affect your balance and may make you more likely to have a fall. What actions can I take to lower my risk of falls? General instructions  Talk with your health care provider about your risks for falling. Tell your health care provider if: ? You fall. Be sure to tell your health care provider about all falls, even ones that seem minor. ? You feel dizzy, sleepy, or off-balance.  Take over-the-counter and prescription medicines only as told by your health care provider. These include any supplements.  Eat a healthy diet and maintain a healthy weight. A healthy diet includes low-fat dairy products, low-fat (lean) meats, and fiber from whole grains, beans, and lots of fruits and vegetables. Home safety  Remove any tripping hazards, such as rugs, cords, and clutter.  Install safety equipment such as grab bars in bathrooms and safety rails on stairs.  Keep rooms and walkways well-lit. Activity   Follow a regular exercise program to stay fit. This will help you maintain your balance. Ask your health care provider what types of exercise are appropriate for you.  If you need a cane or   walker, use it as recommended by your health care provider.  Wear supportive shoes that have nonskid soles. Lifestyle  Do not drink alcohol if your health care provider tells you not to drink.  If you drink alcohol, limit how much you have: ? 0-1 drink a day for women. ? 0-2 drinks a day for men.  Be aware of how much alcohol is in your drink. In the [place] one drink equals one typical bottle of beer (12  oz), one-half glass of wine (5 oz), or one shot of hard liquor (1 oz).  Do not use any products that contain nicotine or tobacco, such as cigarettes and e-cigarettes. If you need help quitting, ask your health care provider. Summary  Having a healthy lifestyle and getting preventive care can help to protect your health and wellness after age 69.  Screening and testing are the best way to find a health problem early and help you avoid having a fall. Early diagnosis and treatment give you the best chance for managing medical conditions that are more common for people who are older than age 69.  Falls are a major cause of broken bones and head injuries in people who are older than age 69. Take precautions to prevent a fall at home.  Work with your health care provider to learn what changes you can make to improve your health and wellness and to prevent falls. This information is not intended to replace advice given to you by your health care provider. Make sure you discuss any questions you have with your health care provider. Document Revised: 11/12/2018 Document Reviewed: 06/04/2017 Elsevier Patient Education  2020 [employer]  

## 2020-03-02 LAB — CBC WITH DIFFERENTIAL/PLATELET
Basophils Absolute: 0 10*3/uL (ref 0.0–0.2)
Basos: 1 %
EOS (ABSOLUTE): 0.1 10*3/uL (ref 0.0–0.4)
Eos: 3 %
Hematocrit: 46.9 % (ref 37.5–51.0)
Hemoglobin: 16.5 g/dL (ref 13.0–17.7)
Immature Grans (Abs): 0 10*3/uL (ref 0.0–0.1)
Immature Granulocytes: 0 %
Lymphocytes Absolute: 1.4 10*3/uL (ref 0.7–3.1)
Lymphs: 32 %
MCH: 32.5 pg (ref 26.6–33.0)
MCHC: 35.2 g/dL (ref 31.5–35.7)
MCV: 93 fL (ref 79–97)
Monocytes Absolute: 0.3 10*3/uL (ref 0.1–0.9)
Monocytes: 8 %
Neutrophils Absolute: 2.4 10*3/uL (ref 1.4–7.0)
Neutrophils: 56 %
Platelets: 168 10*3/uL (ref 150–450)
RBC: 5.07 x10E6/uL (ref 4.14–5.80)
RDW: 12.2 % (ref 11.6–15.4)
WBC: 4.3 10*3/uL (ref 3.4–10.8)

## 2020-03-02 LAB — CMP14+EGFR
ALT: 48 IU/L — ABNORMAL HIGH (ref 0–44)
AST: 45 IU/L — ABNORMAL HIGH (ref 0–40)
Albumin/Globulin Ratio: 2 (ref 1.2–2.2)
Albumin: 4.7 g/dL (ref 3.8–4.8)
Alkaline Phosphatase: 74 IU/L (ref 48–121)
BUN/Creatinine Ratio: 11 (ref 10–24)
BUN: 13 mg/dL (ref 8–27)
Bilirubin Total: 0.8 mg/dL (ref 0.0–1.2)
CO2: 26 mmol/L (ref 20–29)
Calcium: 9.8 mg/dL (ref 8.6–10.2)
Chloride: 97 mmol/L (ref 96–106)
Creatinine, Ser: 1.16 mg/dL (ref 0.76–1.27)
GFR calc Af Amer: 74 mL/min/{1.73_m2} (ref >59)
GFR calc non Af Amer: 64 mL/min/{1.73_m2} (ref >59)
Globulin, Total: 2.4 g/dL (ref 1.5–4.5)
Glucose: 100 mg/dL — ABNORMAL HIGH (ref 65–99)
Potassium: 4.3 mmol/L (ref 3.5–5.2)
Sodium: 141 mmol/L (ref 134–144)
Total Protein: 7.1 g/dL (ref 6.0–8.5)

## 2020-03-02 LAB — TESTOSTERONE,FREE AND TOTAL
Testosterone, Free: 23.4 pg/mL — ABNORMAL HIGH (ref 6.6–18.1)
Testosterone: 1101 ng/dL — ABNORMAL HIGH (ref 264–916)

## 2020-03-02 LAB — PSA, TOTAL AND FREE
PSA, Free Pct: 20 %
PSA, Free: 0.28 ng/mL
Prostate Specific Ag, Serum: 1.4 ng/mL (ref 0.0–4.0)

## 2020-03-02 LAB — LIPID PANEL
Chol/HDL Ratio: 3.5 ratio (ref 0.0–5.0)
Cholesterol, Total: 162 mg/dL (ref 100–199)
HDL: 46 mg/dL (ref >39)
LDL Chol Calc (NIH): 93 mg/dL (ref 0–99)
Triglycerides: 128 mg/dL (ref 0–149)
VLDL Cholesterol Cal: 23 mg/dL (ref 5–40)

## 2020-03-20 DIAGNOSIS — M25562 Pain in left knee: Secondary | ICD-10-CM | POA: Insufficient documentation

## 2020-03-24 DIAGNOSIS — M25562 Pain in left knee: Secondary | ICD-10-CM | POA: Diagnosis not present

## 2020-03-24 DIAGNOSIS — Z96642 Presence of left artificial hip joint: Secondary | ICD-10-CM | POA: Diagnosis not present

## 2020-03-28 ENCOUNTER — Other Ambulatory Visit: Payer: Self-pay | Admitting: Orthopedic Surgery

## 2020-03-28 ENCOUNTER — Other Ambulatory Visit (HOSPITAL_COMMUNITY): Payer: Self-pay | Admitting: Orthopedic Surgery

## 2020-03-28 DIAGNOSIS — Z96642 Presence of left artificial hip joint: Secondary | ICD-10-CM

## 2020-04-05 ENCOUNTER — Encounter (HOSPITAL_COMMUNITY)
Admission: RE | Admit: 2020-04-05 | Discharge: 2020-04-05 | Disposition: A | Discharge status: Home / Self Care | Payer: Medicare Other | Source: Ambulatory Visit | Attending: Orthopedic Surgery | Admitting: Orthopedic Surgery

## 2020-04-05 ENCOUNTER — Other Ambulatory Visit: Payer: Self-pay

## 2020-04-05 DIAGNOSIS — Z96642 Presence of left artificial hip joint: Secondary | ICD-10-CM | POA: Insufficient documentation

## 2020-04-05 DIAGNOSIS — R948 Abnormal results of function studies of other organs and systems: Secondary | ICD-10-CM | POA: Diagnosis not present

## 2020-04-05 MED ORDER — TECHNETIUM TC 99M MEDRONATE IV KIT
20.0000 | PACK | Freq: Once | INTRAVENOUS | Status: AC | PRN
  Administered 2020-04-05: 20 via INTRAVENOUS

## 2020-04-26 DIAGNOSIS — T8484XA Pain due to internal orthopedic prosthetic devices, implants and grafts, initial encounter: Secondary | ICD-10-CM | POA: Diagnosis not present

## 2020-04-26 DIAGNOSIS — Z96642 Presence of left artificial hip joint: Secondary | ICD-10-CM | POA: Diagnosis not present

## 2020-04-26 DIAGNOSIS — T84031A Mechanical loosening of internal left hip prosthetic joint, initial encounter: Secondary | ICD-10-CM | POA: Diagnosis not present

## 2020-05-31 DIAGNOSIS — H18899 Other specified disorders of cornea, unspecified eye: Secondary | ICD-10-CM | POA: Diagnosis not present

## 2020-05-31 DIAGNOSIS — H5203 Hypermetropia, bilateral: Secondary | ICD-10-CM | POA: Diagnosis not present

## 2020-05-31 DIAGNOSIS — H40013 Open angle with borderline findings, low risk, bilateral: Secondary | ICD-10-CM | POA: Diagnosis not present

## 2020-05-31 DIAGNOSIS — H1789 Other corneal scars and opacities: Secondary | ICD-10-CM | POA: Diagnosis not present

## 2020-05-31 DIAGNOSIS — H259 Unspecified age-related cataract: Secondary | ICD-10-CM | POA: Diagnosis not present

## 2020-06-01 ENCOUNTER — Telehealth: Payer: Self-pay | Admitting: Family Medicine

## 2020-06-02 DIAGNOSIS — D225 Melanocytic nevi of trunk: Secondary | ICD-10-CM | POA: Diagnosis not present

## 2020-06-02 DIAGNOSIS — L570 Actinic keratosis: Secondary | ICD-10-CM | POA: Diagnosis not present

## 2020-06-05 NOTE — Telephone Encounter (Signed)
appt made

## 2020-06-12 NOTE — Progress Notes (Addendum)
DUE TO COVID-19 ONLY ONE VISITOR IS ALLOWED TO COME WITH YOU AND STAY IN THE WAITING ROOM ONLY DURING PRE OP AND PROCEDURE DAY OF SURGERY. THE 1 VISITOR  MAY VISIT WITH YOU AFTER SURGERY IN YOUR PRIVATE ROOM DURING VISITING HOURS ONLY!  YOU NEED TO HAVE A COVID 19 TEST ON_11/15/2021 ______ @_______ , THIS TEST MUST BE DONE BEFORE SURGERY,  COVID TESTING SITE [address] [place] [address], IT IS ON THE RIGHT GOING OUT [address] APPROXIMATELY  2 MINUTES PAST [employer] ON THE RIGHT. ONCE YOUR COVID TEST IS COMPLETED,  PLEASE BEGIN THE QUARANTINE INSTRUCTIONS AS OUTLINED IN YOUR HANDOUT.                [name]  06/12/2020   Your procedure is scheduled on: 06/22/2020   Report to [facility] Main  Entrance   Report to admitting at     0610am      Call this number if you have problems the morning of surgery [phone]    REMEMBER: NO  SOLID FOOD CANDY OR GUM AFTER MIDNIGHT. CLEAR LIQUIDS UNTIL  0540am      . NOTHING BY MOUTH EXCEPT CLEAR LIQUIDS UNTIL    . PLEASE FINISH ENSURE DRINK PER SURGEON ORDER  WHICH NEEDS TO BE COMPLETED AT   0540am   .      CLEAR LIQUID DIET   Foods Allowed                                                                    Coffee and tea, regular and decaf                            Fruit ices (not with fruit pulp)                                      Iced Popsicles                                    Carbonated beverages, regular and diet                                    Cranberry, grape and apple juices Sports drinks like Gatorade Lightly seasoned clear broth or consume(fat free) Sugar, honey syrup ___________________________________________________________________      BRUSH YOUR TEETH MORNING OF SURGERY AND RINSE YOUR MOUTH OUT, NO CHEWING GUM CANDY OR MINTS.     Take these medicines the morning of surgery with A SIP OF WATER: none   DO NOT TAKE ANY DIABETIC MEDICATIONS DAY OF YOUR SURGERY                                You may not have any metal on your body including hair pins and              piercings  Do not wear jewelry, make-up,  lotions, powders or perfumes, deodorant             Do not wear nail polish on your fingernails.  Do not shave  48 hours prior to surgery.              Men may shave face and neck.   Do not bring valuables to the hospital. [place].  Contacts, dentures or bridgework may not be worn into surgery.  Leave suitcase in the car. After surgery it may be brought to your room.     Patients discharged the day of surgery will not be allowed to drive home. IF YOU ARE HAVING SURGERY AND GOING HOME THE SAME DAY, YOU MUST HAVE AN ADULT TO DRIVE YOU HOME AND BE WITH YOU FOR 24 HOURS. YOU MAY GO HOME BY TAXI OR UBER OR ORTHERWISE, BUT AN ADULT MUST ACCOMPANY YOU HOME AND STAY WITH YOU FOR 24 HOURS.  Name and phone number of your driver:  Special Instructions: N/A              Please read over the following fact sheets you were given: _____________________________________________________________________  [facility] - Preparing for Surgery Before surgery, you can play an important role.  Because skin is not sterile, your skin needs to be as free of germs as possible.  You can reduce the number of germs on your skin by washing with CHG (chlorahexidine gluconate) soap before surgery.  CHG is an antiseptic cleaner which kills germs and bonds with the skin to continue killing germs even after washing. Please DO NOT use if you have an allergy to CHG or antibacterial soaps.  If your skin becomes reddened/irritated stop using the CHG and inform your nurse when you arrive at Short Stay. Do not shave (including legs and underarms) for at least 48 hours prior to the first CHG shower.  You may shave your face/neck. Please follow these instructions carefully:  1.  Shower with CHG Soap the night before surgery and the  morning of Surgery.  2.   If you choose to wash your hair, wash your hair first as usual with your  normal  shampoo.  3.  After you shampoo, rinse your hair and body thoroughly to remove the  shampoo.                           4.  Use CHG as you would any other liquid soap.  You can apply chg directly  to the skin and wash                       Gently with a scrungie or clean washcloth.  5.  Apply the CHG Soap to your body ONLY FROM THE NECK DOWN.   Do not use on face/ open                           Wound or open sores. Avoid contact with eyes, ears mouth and genitals (private parts).                       Wash face,  Genitals (private parts) with your normal soap.             6.  Wash thoroughly, paying special attention to  the area where your surgery  will be performed.  7.  Thoroughly rinse your body with warm water from the neck down.  8.  DO NOT shower/wash with your normal soap after using and rinsing off  the CHG Soap.                9.  Pat yourself dry with a clean towel.            10.  Wear clean pajamas.            11.  Place clean sheets on your bed the night of your first shower and do not  sleep with pets. Day of Surgery : Do not apply any lotions/deodorants the morning of surgery.  Please wear clean clothes to the hospital/surgery center.  FAILURE TO FOLLOW THESE INSTRUCTIONS MAY RESULT IN THE CANCELLATION OF YOUR SURGERY PATIENT SIGNATURE_________________________________  NURSE SIGNATURE__________________________________  ________________________________________________________________________

## 2020-06-14 ENCOUNTER — Other Ambulatory Visit: Payer: Self-pay

## 2020-06-14 ENCOUNTER — Encounter (HOSPITAL_COMMUNITY)
Admission: RE | Admit: 2020-06-14 | Discharge: 2020-06-14 | Disposition: A | Discharge status: Home / Self Care | Payer: Medicare Other | Source: Ambulatory Visit | Attending: Orthopedic Surgery | Admitting: Orthopedic Surgery

## 2020-06-14 ENCOUNTER — Encounter (HOSPITAL_COMMUNITY): Payer: Self-pay

## 2020-06-14 DIAGNOSIS — Z01812 Encounter for preprocedural laboratory examination: Secondary | ICD-10-CM | POA: Insufficient documentation

## 2020-06-14 HISTORY — DX: Malignant (primary) neoplasm, unspecified: C80.1

## 2020-06-14 LAB — BASIC METABOLIC PANEL
Anion gap: 9 (ref 5–15)
BUN: 14 mg/dL (ref 8–23)
CO2: 25 mmol/L (ref 22–32)
Calcium: 9.5 mg/dL (ref 8.9–10.3)
Chloride: 104 mmol/L (ref 98–111)
Creatinine, Ser: 1.09 mg/dL (ref 0.61–1.24)
GFR, Estimated: >60 mL/min (ref >60)
Glucose, Bld: 119 mg/dL — ABNORMAL HIGH (ref 70–99)
Potassium: 4.6 mmol/L (ref 3.5–5.1)
Sodium: 138 mmol/L (ref 135–145)

## 2020-06-14 LAB — CBC
HCT: 45.4 % (ref 39.0–52.0)
Hemoglobin: 15.6 g/dL (ref 13.0–17.0)
MCH: 32.6 pg (ref 26.0–34.0)
MCHC: 34.4 g/dL (ref 30.0–36.0)
MCV: 95 fL (ref 80.0–100.0)
Platelets: 165 10*3/uL (ref 150–400)
RBC: 4.78 MIL/uL (ref 4.22–5.81)
RDW: 12.2 % (ref 11.5–15.5)
WBC: 4.2 10*3/uL (ref 4.0–10.5)
nRBC: 0 % (ref 0.0–0.2)

## 2020-06-14 LAB — APTT: aPTT: 36 seconds (ref 24–36)

## 2020-06-14 LAB — PROTIME-INR
INR: 1 (ref 0.8–1.2)
Prothrombin Time: 12.7 seconds (ref 11.4–15.2)

## 2020-06-14 LAB — SURGICAL PCR SCREEN
MRSA, PCR: NEGATIVE
Staphylococcus aureus: NEGATIVE

## 2020-06-16 ENCOUNTER — Encounter: Payer: Self-pay | Admitting: Family

## 2020-06-16 ENCOUNTER — Other Ambulatory Visit: Payer: Self-pay

## 2020-06-16 ENCOUNTER — Ambulatory Visit (INDEPENDENT_AMBULATORY_CARE_PROVIDER_SITE_OTHER): Payer: Medicare Other

## 2020-06-16 ENCOUNTER — Ambulatory Visit (INDEPENDENT_AMBULATORY_CARE_PROVIDER_SITE_OTHER): Payer: Medicare Other | Admitting: Family

## 2020-06-16 VITALS — BP 131/79 | HR 62 | Temp 98.2°F | Ht 72.0 in | Wt 195.0 lb

## 2020-06-16 DIAGNOSIS — Z01818 Encounter for other preprocedural examination: Secondary | ICD-10-CM

## 2020-06-16 DIAGNOSIS — M25559 Pain in unspecified hip: Secondary | ICD-10-CM | POA: Diagnosis not present

## 2020-06-16 NOTE — Progress Notes (Addendum)
Anesthesia Review:  PCP: [facility] Christy Hawks,NP LOV- 06/16/20- for preop clearance Clearance faxed to DR Alvan Dame.  Have requested clearance from [facility] at Dr Alvan Dame office/   Clearance on chart dated 06/16/20 from christy Hawks,NP  Cardiologist : Chest x-ray : EKG : Echo : Stress test: Cardiac Cath :  Activity level: can do a flight of stairs without difficulty  Sleep Study/ CPAP : Fasting Blood Sugar :      / Checks Blood Sugar -- times a day:   Blood Thinner/ Instructions /Last Dose: ASA / Instructions/ Last Dose :

## 2020-06-16 NOTE — Patient Instructions (Signed)
Hip Arthroscopy Hip arthroscopy is a surgery to examine the inside of the hip joint and repair any damage. You may have this surgery if you have:  Loose pieces of bone or cartilage that are causing pain.  An overgrowth of bone that has damaged some soft tissue.  A tear in the cartilage that lines the rim of the hip socket (labrum).  Swollen tissue around the hip joint.  Damage to the hip joint from a tendon repeatedly rubbing across the joint. Tendons are bands of tissue that connect muscles to bones.  An advanced infection in the hip (septic hip).  Hip pain that does not go away with other treatments. Arthroscopic surgery is done using a thin tube that has a light and camera on the end of it (arthroscope). The arthroscope is placed through a small incision, and the camera sends images to a screen in the operating room. The images are used to help perform the surgery. Tell a health care provider about:  Any allergies you have.  All medicines you are taking, including vitamins, herbs, eye drops, creams, and over-the-counter medicines.  Any problems you or family members have had with anesthetic medicines.  Any blood disorders you have.  Any surgeries you have had.  Any medical conditions you have.  Whether you are pregnant or may be pregnant. What are the risks? Generally, this is a safe procedure. However, problems may occur, including:  Infection.  Bleeding.  Allergic reactions to medicines.  Damage to blood vessels, nerves, ligaments, or cartilage in the hip.  A blood clot that forms in the leg and travels to the lung (pulmonary embolism).  Failure of the surgery to relieve symptoms.  Hip stiffness. What happens before the procedure? Staying hydrated Follow instructions from your health care provider about hydration, which may include:  Up to 2 hours before the procedure - you may continue to drink clear liquids, such as water, clear fruit juice, black coffee,  and plain tea. Eating and drinking restrictions Follow instructions from your health care provider about eating and drinking, which may include:  8 hours before the procedure - stop eating heavy meals or foods such as meat, fried foods, or fatty foods.  6 hours before the procedure - stop eating light meals or foods, such as toast or cereal.  6 hours before the procedure - stop drinking milk or drinks that contain milk.  2 hours before the procedure - stop drinking clear liquids. Medicines Ask your health care provider about:  Changing or stopping your regular medicines. This is especially important if you are taking diabetes medicines or blood thinners.  Taking medicines such as aspirin and ibuprofen. These medicines can thin your blood. Do not take these medicines unless your health care provider tells you to take them.  Taking over-the-counter medicines, vitamins, herbs, and supplements. General instructions  Do not drink alcohol unless your health care provider says that you can.  Starting one month or more before surgery, do not use any products that contain nicotine or tobacco. These include cigarettes and e-cigarettes. If you need help quitting, ask your health care provider.  You may have a physical exam and tests, such as an X-ray, CT scan, or MRI.  Plan to have someone take you home from the hospital or clinic.  Plan to have a responsible adult care for you for at least 24 hours after you leave the hospital or clinic. This is important. What happens during the procedure?   To lower your  risk of infection: ? Your health care team will wash or sanitize their hands. ? Hair may be removed from the surgical area. ? Your hip area will be cleaned with a germ-killing solution (antiseptic).  An IV will be inserted into one of your veins.  You will be given one or more of the following: ? A medicine to help you relax (sedative). ? A medicine to numb the hip area (local  anesthetic). ? A medicine to make you fall asleep (general anesthetic). ? A medicine that is injected into your spine to numb the area below and slightly above the injection site (spinal anesthetic). ? A medicine that is injected into an area of your body to numb everything below the injection site (regional anesthetic). This may be injected into your groin or thigh.  Your hip bone (pelvis) will be pulled slightly away from your thighbone (femur) socket. This provides more room for surgical instruments.  Several small incisions will be made in your hip area.  Your hip joint will be rinsed (flushed) and filled with a germ-free solution (sterile saline). This expands the area to allow your surgeon to see the joint more clearly.  An arthroscope will be passed through one of your incisions, into your hip joint.  Other surgical instruments will be passed through the other incisions. Your surgeon will examine and repair your hip as needed.  The sterile saline will be drained from your hip.  Your incisions will be closed with adhesive strips or stitches (sutures) and covered with a bandage (dressing). The procedure may vary among health care providers and hospitals. What happens after the procedure?  Your blood pressure, heart rate, breathing rate, and blood oxygen level will be monitored until you leave the hospital or clinic.  You will be given pain medicine as needed.  You may be given medicine to lower your risk of blood clots.  You may have to wear compression stockings. These stockings help to prevent blood clots and reduce swelling in your legs.  Your health care provider will give you instructions about how much body weight you can safely support on your leg (weight-bearing restrictions). You may be given crutches or other devices to help you move around (assistive devices).  You may be shown how to do physical therapy exercises to help you recover.  Do not drive until your health  care provider approves. Summary  Hip arthroscopy is a surgery to examine the inside of the hip joint and repair any damage.  Before the procedure, follow instructions from your health care provider about eating and drinking.  Plan to have someone take you home from the hospital or clinic. This information is not intended to replace advice given to you by your health care provider. Make sure you discuss any questions you have with your health care provider. Document Revised: 09/28/2018 Document Reviewed: 07/09/2017 Elsevier Patient Education  2020 [employer].

## 2020-06-16 NOTE — Progress Notes (Signed)
   Subjective:    Patient ID: [name], male    DOB: February 16, 1951, 69 y.o.   MRN: [id number]  Chief Complaint  Patient presents with  . Surgical clearance    Pt presents to the office today for surgical clearance. He is scheduled for left total hip arthroplasty on 06/22/20. He already lab work on 06/14/20 that was normal.  Hip Pain  The incident occurred more than 1 week ago. The pain is present in the left hip. The pain is at a severity of 6/10. The pain is moderate. The pain has been intermittent since onset. Pertinent negatives include no muscle weakness or numbness. He reports no foreign bodies present. The symptoms are aggravated by movement. He has tried acetaminophen for the symptoms. The treatment provided mild relief.     Review of Systems  Neurological: Negative for numbness.  All other systems reviewed and are negative.      Objective:   Physical Exam Vitals reviewed.  Constitutional:      General: He is not in acute distress.    Appearance: He is well-developed.  HENT:     Head: Normocephalic.     Right Ear: Tympanic membrane normal.     Left Ear: Tympanic membrane normal.  Eyes:     General:        Right eye: No discharge.        Left eye: No discharge.     Pupils: Pupils are equal, round, and reactive to light.  Neck:     Thyroid: No thyromegaly.  Cardiovascular:     Rate and Rhythm: Normal rate and regular rhythm.     Heart sounds: Normal heart sounds. No murmur heard.   Pulmonary:     Effort: Pulmonary effort is normal. No respiratory distress.     Breath sounds: Normal breath sounds. No wheezing.  Abdominal:     General: Bowel sounds are normal. There is no distension.     Palpations: Abdomen is soft.     Tenderness: There is no abdominal tenderness.  Musculoskeletal:        General: No tenderness. Normal range of motion.     Cervical back: Normal range of motion and neck supple.  Skin:    General: Skin is warm and dry.     Findings: No  erythema or rash.  Neurological:     Mental Status: He is alert and oriented to person, place, and time.     Cranial Nerves: No cranial nerve deficit.     Deep Tendon Reflexes: Reflexes are normal and symmetric.  Psychiatric:        Behavior: Behavior normal.        Thought Content: Thought content normal.        Judgment: Judgment normal.     EKG- Sinus rhythm   BP 131/79   Pulse 62   Temp 98.2 F (36.8 C) (Temporal)   Ht 6' (1.829 m)   Wt 195 lb (88.5 kg)   SpO2 96%   BMI 26.45 kg/m      Assessment & Plan:  [name] comes in today with chief complaint of Surgical clearance    Diagnosis and orders addressed:  1. Pre-op exam - EKG 12-Lead - DG Chest 2 View; Future  2. Hip pain - DG Chest 2 View; Future  Labs reviewed from hospital  Keep Ortho appt Clearance  form completed and faxed   Evelina Dun, FNP

## 2020-06-19 ENCOUNTER — Other Ambulatory Visit (HOSPITAL_COMMUNITY)
Admission: RE | Admit: 2020-06-19 | Discharge: 2020-06-19 | Disposition: A | Discharge status: Home / Self Care | Payer: Medicare Other | Source: Ambulatory Visit | Attending: Orthopedic Surgery | Admitting: Orthopedic Surgery

## 2020-06-19 DIAGNOSIS — Z20822 Contact with and (suspected) exposure to covid-19: Secondary | ICD-10-CM | POA: Insufficient documentation

## 2020-06-19 DIAGNOSIS — Z01812 Encounter for preprocedural laboratory examination: Secondary | ICD-10-CM | POA: Insufficient documentation

## 2020-06-19 LAB — SARS CORONAVIRUS 2 (TAT 6-24 HRS): SARS Coronavirus 2: NEGATIVE

## 2020-06-21 NOTE — H&P (Signed)
TOTAL HIP REVISION ADMISSION H&P  Patient is admitted for left revision total hip arthroplasty.  Subjective:  Chief Complaint: Left hip pain s/p THA with loose femoral component  HPI: [name], 69 y.o. male, has a history of pain and functional disability in the left hip due to loosening and patient has failed non-surgical conservative treatments for greater than 12 weeks to include NSAID's and/or analgesics, use of assistive devices and activity modification. The indications for the revision total hip arthroplasty are loosening of one or more components.  Onset of symptoms was gradual starting 1 years ago with gradually worsening course since that time.  Prior procedures on the left hip include arthroplasty.  Patient currently rates pain in the left hip at 5 out of 10 with activity.  There is worsening of pain with activity and weight bearing, trendelenberg gait, pain that interfers with activities of daily living and pain with passive range of motion. Patient has evidence of prosthetic loosening by imaging studies.  This condition presents safety issues increasing the risk of falls.   There is no current active infection.  Risks, benefits and expectations were discussed with the patient.  Risks including but not limited to the risk of anesthesia, blood clots, nerve damage, blood vessel damage, failure of the prosthesis, infection and up to and including death.  Patient understand the risks, benefits and expectations and wishes to proceed with surgery.   D/C Plans:       Home   Post-op Meds:       No Rx given   Tranexamic Acid:      To be given - IV   Decadron:      Is to be given  FYI:      ASA  Norco  DME:   Pt already has a 3-n-1 and a RW is ordered  PT:   HEP  Pharmacy: [facility] - [id number] [place], [place], [place]   Patient Active Problem List   Diagnosis Date Noted  . Vitamin D deficiency 09/02/2019  . Scoliosis deformity of spine 10/02/2018  . Thrombocytopenia ([place])  05/23/2016  . Testosterone deficiency 11/25/2012  . 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 Hyperlipidemia 09/25/2011   Past Medical History:  Diagnosis Date  . Actinic keratosis   . Arthritis    hip , shoulders   . Cancer (HCC)    hx of skin cancer   . Diverticulitis   . GERD (gastroesophageal reflux disease)   . Hyperlipidemia   . Osteoarthritis    left Hip  . Pneumonia    hx of years ago   . Polyp, sigmoid colon   . Testosterone deficiency 2012    Past Surgical History:  Procedure Laterality Date  . COLONOSCOPY  10/10/2006  . SHOULDER ARTHROSCOPY  11/2017  . TOTAL HIP ARTHROPLASTY  10/01/2011   Procedure: TOTAL HIP ARTHROPLASTY ANTERIOR APPROACH;  Surgeon: Mauri Pole, MD;  Location: [facility];  Service: Orthopedics;  Laterality: Left;    No current facility-administered medications for this encounter.   Current Outpatient Medications  Medication Sig Dispense Refill Last Dose  . acetaminophen (TYLENOL) 500 MG tablet Take 1,000 mg by mouth in the morning.     Marland Kitchen anastrozole (ARIMIDEX) 1 MG tablet TAKE 1/2 TABLET ONCE A WEEK (Patient taking differently: Take 0.5 mg by mouth once a week. ) 6 tablet 7   . Cholecalciferol (VITAMIN D3) 75 MCG (3000 UT) TABS Take 3,000 Units by mouth daily.     Marland Kitchen  oxymetazoline (AFRIN) 0.05 % nasal spray Place 1 spray into both nostrils at bedtime.     . rosuvastatin (CRESTOR) 10 MG tablet Take 1 tablet (10 mg total) by mouth daily. (Patient taking differently: Take 10 mg by mouth at bedtime. ) 90 tablet 3   . testosterone cypionate (DEPOTESTOSTERONE CYPIONATE) 200 MG/ML injection Inject 0.5 mLs (100 mg total) into the muscle once a week. 10 mL 1   . OVER THE COUNTER MEDICATION Anastrozole, turmeric, glucosamine chondroitin (Patient not taking: Reported on 06/12/2020)   Not Taking at Unknown time  . VITAMIN D PO Take 2,000 Units by mouth 2 (two) times daily.  (Patient not taking: Reported on 06/12/2020)   Not Taking at Unknown time   No Known Allergies    Social History   Tobacco Use  . Smoking status: Former Smoker    Packs/day: 1.00    Types: Cigarettes    Start date: 04/02/1983    Quit date: 04/01/1988    Years since quitting: 32.2  . Smokeless tobacco: Never Used  Substance Use Topics  . Alcohol use: Yes    Comment: glass of wine or beer daily     Family History  Problem Relation Age of Onset  . Kidney cancer Mother 12  . Renal cancer Mother   . Alzheimer's disease Father 14  . Bipolar disorder Son   . Stroke Paternal Uncle   . Heart disease Paternal Grandmother   . Colon cancer Neg Hx   . Esophageal cancer Neg Hx   . Rectal cancer Neg Hx   . Stomach cancer Neg Hx   . Liver cancer Neg Hx   . Pancreatic cancer Neg Hx   . Prostate cancer Neg Hx       Review of Systems  Constitutional: Negative.   HENT: Negative.   Eyes: Negative.   Respiratory: Negative.   Cardiovascular: Negative.   Gastrointestinal: Negative.   Genitourinary: Negative.   Musculoskeletal: Positive for joint pain.  Skin: Negative.   Neurological: Negative.   Endo/Heme/Allergies: Negative.   Psychiatric/Behavioral: Negative.       Objective:  Physical Exam Constitutional:      Appearance: He is well-developed.  HENT:     Head: Normocephalic.  Eyes:     Pupils: Pupils are equal, round, and reactive to light.  Neck:     Thyroid: No thyromegaly.     Vascular: No JVD.     Trachea: No tracheal deviation.  Cardiovascular:     Rate and Rhythm: Normal rate and regular rhythm.  Pulmonary:     Effort: Pulmonary effort is normal. No respiratory distress.     Breath sounds: Normal breath sounds. No wheezing.  Abdominal:     Palpations: Abdomen is soft.     Tenderness: There is no abdominal tenderness. There is no guarding.  Musculoskeletal:     Cervical back: Neck supple.     Left hip: Tenderness and bony tenderness present. No deformity. Decreased range of motion.  Lymphadenopathy:     Cervical: No cervical adenopathy.  Skin:     General: Skin is warm and dry.  Neurological:     Mental Status: He is alert and oriented to person, place, and time.       Labs:  Estimated body mass index is 26.45 kg/m as calculated from the following:   Height as of 06/16/20: 6' (1.829 m).   Weight as of 06/16/20: 88.5 kg.  Imaging Review:  Plain radiographs demonstrate previous THA of the left hip.  There is evidence of loosening of the femoral stem.The bone quality appears to be good for age and reported activity level.     Assessment/Plan:  Left hip with failed previous arthroplasty.  The patient history, physical examination, clinical judgement of the provider and imaging studies are consistent with failure of the left hip(s), previous total hip arthroplasty. Revision total hip arthroplasty is deemed medically necessary. The treatment options including medical management, injection therapy, arthroscopy and arthroplasty were discussed at length. The risks and benefits of total hip arthroplasty were presented and reviewed. The risks due to aseptic loosening, infection, stiffness, dislocation/subluxation,  thromboembolic complications and other imponderables were discussed.  The patient acknowledged the explanation, agreed to proceed with the plan and consent was signed. Patient is being admitted for treatment for surgery, pain control, PT, OT, prophylactic antibiotics, VTE prophylaxis, progressive ambulation and ADL's and discharge planning. The patient is planning to be discharged home.   West Pugh Vitali Seibert   PA-C  06/21/2020, 3:32 PM

## 2020-06-22 ENCOUNTER — Ambulatory Visit (HOSPITAL_COMMUNITY): Payer: Medicare Other | Admitting: Physician Assistant

## 2020-06-22 ENCOUNTER — Inpatient Hospital Stay (HOSPITAL_COMMUNITY): Payer: Medicare Other

## 2020-06-22 ENCOUNTER — Observation Stay (HOSPITAL_COMMUNITY)
Admission: RE | Admit: 2020-06-22 | Discharge: 2020-06-23 | Disposition: A | Discharge status: Home / Self Care | Payer: Medicare Other | Attending: Orthopedic Surgery | Admitting: Orthopedic Surgery

## 2020-06-22 ENCOUNTER — Other Ambulatory Visit: Payer: Self-pay

## 2020-06-22 ENCOUNTER — Encounter (HOSPITAL_COMMUNITY)
Admission: RE | Disposition: A | Discharge status: Home / Self Care | Payer: Self-pay | Source: Home / Self Care | Attending: Orthopedic Surgery

## 2020-06-22 ENCOUNTER — Ambulatory Visit (HOSPITAL_COMMUNITY): Payer: Medicare Other | Admitting: Anesthesiology

## 2020-06-22 ENCOUNTER — Encounter (HOSPITAL_COMMUNITY): Payer: Self-pay | Admitting: Orthopedic Surgery

## 2020-06-22 DIAGNOSIS — Z87891 Personal history of nicotine dependence: Secondary | ICD-10-CM | POA: Diagnosis not present

## 2020-06-22 DIAGNOSIS — Z96649 Presence of unspecified artificial hip joint: Secondary | ICD-10-CM

## 2020-06-22 DIAGNOSIS — Z471 Aftercare following joint replacement surgery: Secondary | ICD-10-CM | POA: Diagnosis not present

## 2020-06-22 DIAGNOSIS — Z96643 Presence of artificial hip joint, bilateral: Secondary | ICD-10-CM | POA: Diagnosis not present

## 2020-06-22 DIAGNOSIS — D696 Thrombocytopenia, unspecified: Secondary | ICD-10-CM | POA: Diagnosis not present

## 2020-06-22 DIAGNOSIS — T84091A Other mechanical complication of internal left hip prosthesis, initial encounter: Principal | ICD-10-CM | POA: Insufficient documentation

## 2020-06-22 DIAGNOSIS — M47817 Spondylosis without myelopathy or radiculopathy, lumbosacral region: Secondary | ICD-10-CM | POA: Diagnosis not present

## 2020-06-22 DIAGNOSIS — Z96642 Presence of left artificial hip joint: Secondary | ICD-10-CM | POA: Diagnosis not present

## 2020-06-22 DIAGNOSIS — T8484XA Pain due to internal orthopedic prosthetic devices, implants and grafts, initial encounter: Secondary | ICD-10-CM | POA: Diagnosis not present

## 2020-06-22 DIAGNOSIS — M25552 Pain in left hip: Secondary | ICD-10-CM | POA: Diagnosis present

## 2020-06-22 DIAGNOSIS — E559 Vitamin D deficiency, unspecified: Secondary | ICD-10-CM | POA: Diagnosis not present

## 2020-06-22 DIAGNOSIS — E785 Hyperlipidemia, unspecified: Secondary | ICD-10-CM | POA: Diagnosis not present

## 2020-06-22 DIAGNOSIS — Z85828 Personal history of other malignant neoplasm of skin: Secondary | ICD-10-CM | POA: Diagnosis not present

## 2020-06-22 HISTORY — PX: TOTAL HIP REVISION: SHX763

## 2020-06-22 LAB — TYPE AND SCREEN
ABO/RH(D): O POS
Antibody Screen: NEGATIVE

## 2020-06-22 SURGERY — TOTAL HIP REVISION
Anesthesia: Spinal | Site: Hip | Laterality: Left

## 2020-06-22 MED ORDER — POVIDONE-IODINE 10 % EX SWAB
2.0000 "application " | Freq: Once | CUTANEOUS | Status: AC
  Administered 2020-06-22: 2 via TOPICAL

## 2020-06-22 MED ORDER — HYDROCODONE-ACETAMINOPHEN 5-325 MG PO TABS
1.0000 | ORAL_TABLET | ORAL | Status: DC | PRN
  Administered 2020-06-22 – 2020-06-23 (×2): 2 via ORAL
  Filled 2020-06-22 (×2): qty 2

## 2020-06-22 MED ORDER — DIPHENHYDRAMINE HCL 12.5 MG/5ML PO ELIX: 12.5000 mg | ORAL_SOLUTION | ORAL | Status: DC | PRN

## 2020-06-22 MED ORDER — HYDROCODONE-ACETAMINOPHEN 7.5-325 MG PO TABS: 1.0000 | ORAL_TABLET | ORAL | Status: DC | PRN

## 2020-06-22 MED ORDER — ONDANSETRON HCL 4 MG/2ML IJ SOLN: 4.0000 mg | Freq: Once | INTRAMUSCULAR | Status: DC | PRN

## 2020-06-22 MED ORDER — OXYMETAZOLINE HCL 0.05 % NA SOLN
1.0000 | Freq: Every day | NASAL | Status: DC
  Administered 2020-06-22: 1 via NASAL
  Filled 2020-06-22: qty 15

## 2020-06-22 MED ORDER — HYDROMORPHONE HCL 1 MG/ML IJ SOLN
0.2500 mg | INTRAMUSCULAR | Status: DC | PRN
  Administered 2020-06-22 (×2): 0.25 mg via INTRAVENOUS

## 2020-06-22 MED ORDER — METHOCARBAMOL 500 MG PO TABS: 500.0000 mg | ORAL_TABLET | Freq: Four times a day (QID) | ORAL | Status: DC | PRN

## 2020-06-22 MED ORDER — BISACODYL 10 MG RE SUPP: 10.0000 mg | Freq: Every day | RECTAL | Status: DC | PRN

## 2020-06-22 MED ORDER — STERILE WATER FOR IRRIGATION IR SOLN
Status: DC | PRN
  Administered 2020-06-22: 2000 mL

## 2020-06-22 MED ORDER — METOCLOPRAMIDE HCL 5 MG PO TABS: 5.0000 mg | ORAL_TABLET | Freq: Three times a day (TID) | ORAL | Status: DC | PRN

## 2020-06-22 MED ORDER — PHENYLEPHRINE 40 MCG/ML (10ML) SYRINGE FOR IV PUSH (FOR BLOOD PRESSURE SUPPORT)
PREFILLED_SYRINGE | INTRAVENOUS | Status: AC
  Filled 2020-06-22: qty 10

## 2020-06-22 MED ORDER — ACETAMINOPHEN 325 MG PO TABS: 325.0000 mg | ORAL_TABLET | Freq: Four times a day (QID) | ORAL | Status: DC | PRN

## 2020-06-22 MED ORDER — MORPHINE SULFATE (PF) 2 MG/ML IV SOLN: 0.5000 mg | INTRAVENOUS | Status: DC | PRN

## 2020-06-22 MED ORDER — TRANEXAMIC ACID-NACL 1000-0.7 MG/100ML-% IV SOLN
1000.0000 mg | INTRAVENOUS | Status: AC
  Administered 2020-06-22: 1000 mg via INTRAVENOUS
  Filled 2020-06-22: qty 100

## 2020-06-22 MED ORDER — METHOCARBAMOL 500 MG IVPB - SIMPLE MED
500.0000 mg | Freq: Four times a day (QID) | INTRAVENOUS | Status: DC | PRN
  Filled 2020-06-22: qty 50

## 2020-06-22 MED ORDER — FERROUS SULFATE 325 (65 FE) MG PO TABS
325.0000 mg | ORAL_TABLET | Freq: Three times a day (TID) | ORAL | Status: DC
  Administered 2020-06-22 – 2020-06-23 (×2): 325 mg via ORAL
  Filled 2020-06-22 (×2): qty 1

## 2020-06-22 MED ORDER — SUGAMMADEX SODIUM 200 MG/2ML IV SOLN
INTRAVENOUS | Status: DC | PRN
  Administered 2020-06-22: 200 mg via INTRAVENOUS

## 2020-06-22 MED ORDER — ASPIRIN 81 MG PO CHEW
81.0000 mg | CHEWABLE_TABLET | Freq: Two times a day (BID) | ORAL | Status: DC
  Administered 2020-06-22 – 2020-06-23 (×2): 81 mg via ORAL
  Filled 2020-06-22 (×2): qty 1

## 2020-06-22 MED ORDER — EPHEDRINE SULFATE-NACL 50-0.9 MG/10ML-% IV SOSY
PREFILLED_SYRINGE | INTRAVENOUS | Status: DC | PRN
  Administered 2020-06-22 (×2): 20 mg via INTRAVENOUS
  Administered 2020-06-22: 10 mg via INTRAVENOUS

## 2020-06-22 MED ORDER — ROSUVASTATIN CALCIUM 10 MG PO TABS
10.0000 mg | ORAL_TABLET | Freq: Every day | ORAL | Status: DC
  Administered 2020-06-22: 10 mg via ORAL
  Filled 2020-06-22: qty 1

## 2020-06-22 MED ORDER — POLYETHYLENE GLYCOL 3350 17 G PO PACK
17.0000 g | PACK | Freq: Two times a day (BID) | ORAL | Status: DC
  Administered 2020-06-22 – 2020-06-23 (×3): 17 g via ORAL
  Filled 2020-06-22 (×3): qty 1

## 2020-06-22 MED ORDER — EPHEDRINE 5 MG/ML INJ
INTRAVENOUS | Status: AC
  Filled 2020-06-22: qty 10

## 2020-06-22 MED ORDER — ONDANSETRON HCL 4 MG PO TABS: 4.0000 mg | ORAL_TABLET | Freq: Four times a day (QID) | ORAL | Status: DC | PRN

## 2020-06-22 MED ORDER — SODIUM CHLORIDE 0.9 % IR SOLN
Status: DC | PRN
  Administered 2020-06-22: 1000 mL

## 2020-06-22 MED ORDER — PROPOFOL 10 MG/ML IV BOLUS
INTRAVENOUS | Status: AC
  Filled 2020-06-22: qty 20

## 2020-06-22 MED ORDER — CEFAZOLIN SODIUM-DEXTROSE 2-4 GM/100ML-% IV SOLN
2.0000 g | Freq: Four times a day (QID) | INTRAVENOUS | Status: AC
  Administered 2020-06-22 (×2): 2 g via INTRAVENOUS
  Filled 2020-06-22 (×2): qty 100

## 2020-06-22 MED ORDER — ONDANSETRON HCL 4 MG/2ML IJ SOLN
INTRAMUSCULAR | Status: DC | PRN
  Administered 2020-06-22: 4 mg via INTRAVENOUS

## 2020-06-22 MED ORDER — MIDAZOLAM HCL 2 MG/2ML IJ SOLN
INTRAMUSCULAR | Status: AC
  Filled 2020-06-22: qty 2

## 2020-06-22 MED ORDER — ALUM & MAG HYDROXIDE-SIMETH 200-200-20 MG/5ML PO SUSP: 15.0000 mL | ORAL | Status: DC | PRN

## 2020-06-22 MED ORDER — DEXAMETHASONE SODIUM PHOSPHATE 10 MG/ML IJ SOLN
10.0000 mg | Freq: Once | INTRAMUSCULAR | Status: AC
  Administered 2020-06-23: 10 mg via INTRAVENOUS
  Filled 2020-06-22: qty 1

## 2020-06-22 MED ORDER — CEFAZOLIN SODIUM-DEXTROSE 2-4 GM/100ML-% IV SOLN
2.0000 g | INTRAVENOUS | Status: AC
  Administered 2020-06-22: 2 g via INTRAVENOUS
  Filled 2020-06-22: qty 100

## 2020-06-22 MED ORDER — LIDOCAINE 2% (20 MG/ML) 5 ML SYRINGE
INTRAMUSCULAR | Status: DC | PRN
  Administered 2020-06-22: 100 mg via INTRAVENOUS

## 2020-06-22 MED ORDER — MAGNESIUM CITRATE PO SOLN: 1.0000 | Freq: Once | ORAL | Status: DC | PRN

## 2020-06-22 MED ORDER — ONDANSETRON HCL 4 MG/2ML IJ SOLN
INTRAMUSCULAR | Status: AC
  Filled 2020-06-22: qty 2

## 2020-06-22 MED ORDER — TRANEXAMIC ACID-NACL 1000-0.7 MG/100ML-% IV SOLN
1000.0000 mg | Freq: Once | INTRAVENOUS | Status: AC
  Administered 2020-06-22: 1000 mg via INTRAVENOUS
  Filled 2020-06-22: qty 100

## 2020-06-22 MED ORDER — FENTANYL CITRATE (PF) 250 MCG/5ML IJ SOLN
INTRAMUSCULAR | Status: AC
  Filled 2020-06-22: qty 5

## 2020-06-22 MED ORDER — DOCUSATE SODIUM 100 MG PO CAPS
100.0000 mg | ORAL_CAPSULE | Freq: Two times a day (BID) | ORAL | Status: DC
  Administered 2020-06-22 – 2020-06-23 (×2): 100 mg via ORAL
  Filled 2020-06-22 (×2): qty 1

## 2020-06-22 MED ORDER — MENTHOL 3 MG MT LOZG: 1.0000 | LOZENGE | OROMUCOSAL | Status: DC | PRN

## 2020-06-22 MED ORDER — DEXAMETHASONE SODIUM PHOSPHATE 10 MG/ML IJ SOLN
INTRAMUSCULAR | Status: AC
  Filled 2020-06-22: qty 1

## 2020-06-22 MED ORDER — ROCURONIUM BROMIDE 10 MG/ML (PF) SYRINGE
PREFILLED_SYRINGE | INTRAVENOUS | Status: AC
  Filled 2020-06-22: qty 10

## 2020-06-22 MED ORDER — DEXAMETHASONE SODIUM PHOSPHATE 10 MG/ML IJ SOLN
10.0000 mg | Freq: Once | INTRAMUSCULAR | Status: AC
  Administered 2020-06-22: 10 mg via INTRAVENOUS

## 2020-06-22 MED ORDER — ONDANSETRON HCL 4 MG/2ML IJ SOLN
4.0000 mg | Freq: Four times a day (QID) | INTRAMUSCULAR | Status: DC | PRN
  Administered 2020-06-22: 4 mg via INTRAVENOUS
  Filled 2020-06-22: qty 2

## 2020-06-22 MED ORDER — PROPOFOL 10 MG/ML IV BOLUS
INTRAVENOUS | Status: DC | PRN
  Administered 2020-06-22: 200 mg via INTRAVENOUS

## 2020-06-22 MED ORDER — HYDROMORPHONE HCL 1 MG/ML IJ SOLN
INTRAMUSCULAR | Status: AC
  Administered 2020-06-22: 0.5 mg via INTRAVENOUS
  Filled 2020-06-22: qty 2

## 2020-06-22 MED ORDER — PHENOL 1.4 % MT LIQD: 1.0000 | OROMUCOSAL | Status: DC | PRN

## 2020-06-22 MED ORDER — LIDOCAINE 2% (20 MG/ML) 5 ML SYRINGE
INTRAMUSCULAR | Status: AC
  Filled 2020-06-22: qty 5

## 2020-06-22 MED ORDER — LACTATED RINGERS IV SOLN: INTRAVENOUS | Status: DC

## 2020-06-22 MED ORDER — METHOCARBAMOL 500 MG IVPB - SIMPLE MED
INTRAVENOUS | Status: AC
  Administered 2020-06-22: 500 mg via INTRAVENOUS
  Filled 2020-06-22: qty 50

## 2020-06-22 MED ORDER — MIDAZOLAM HCL 5 MG/5ML IJ SOLN
INTRAMUSCULAR | Status: DC | PRN
  Administered 2020-06-22: 2 mg via INTRAVENOUS

## 2020-06-22 MED ORDER — PHENYLEPHRINE 40 MCG/ML (10ML) SYRINGE FOR IV PUSH (FOR BLOOD PRESSURE SUPPORT)
PREFILLED_SYRINGE | INTRAVENOUS | Status: DC | PRN
  Administered 2020-06-22: 80 ug via INTRAVENOUS

## 2020-06-22 MED ORDER — ROCURONIUM BROMIDE 10 MG/ML (PF) SYRINGE
PREFILLED_SYRINGE | INTRAVENOUS | Status: DC | PRN
  Administered 2020-06-22: 70 mg via INTRAVENOUS
  Administered 2020-06-22: 20 mg via INTRAVENOUS

## 2020-06-22 MED ORDER — FENTANYL CITRATE (PF) 250 MCG/5ML IJ SOLN
INTRAMUSCULAR | Status: DC | PRN
  Administered 2020-06-22 (×2): 100 ug via INTRAVENOUS
  Administered 2020-06-22: 50 ug via INTRAVENOUS

## 2020-06-22 MED ORDER — METOCLOPRAMIDE HCL 5 MG/ML IJ SOLN: 5.0000 mg | Freq: Three times a day (TID) | INTRAMUSCULAR | Status: DC | PRN

## 2020-06-22 MED ORDER — SODIUM CHLORIDE 0.9 % IV SOLN: INTRAVENOUS | Status: DC

## 2020-06-22 SURGICAL SUPPLY — 63 items
ADH SKN CLS APL DERMABOND .7 (GAUZE/BANDAGES/DRESSINGS) ×1
BAG DECANTER FOR FLEXI CONT (MISCELLANEOUS) ×1 IMPLANT
BAG SPEC THK2 15X12 ZIP CLS (MISCELLANEOUS) ×1
BAG ZIPLOCK 12X15 (MISCELLANEOUS) ×3 IMPLANT
BLADE FLEX CHISEL 8 2.5 (MISCELLANEOUS) ×2 IMPLANT
BLADE SAW SGTL 18X1.27X75 (BLADE) IMPLANT
BLADE SAW SGTL 18X1.27X75MM (BLADE)
BRUSH FEMORAL CANAL (MISCELLANEOUS) IMPLANT
COVER SURGICAL LIGHT HANDLE (MISCELLANEOUS) ×3 IMPLANT
COVER WAND RF STERILE (DRAPES) IMPLANT
DERMABOND ADVANCED (GAUZE/BANDAGES/DRESSINGS) ×2
DERMABOND ADVANCED .7 DNX12 (GAUZE/BANDAGES/DRESSINGS) ×1 IMPLANT
DRAPE INCISE IOBAN 85X60 (DRAPES) ×3 IMPLANT
DRAPE ORTHO SPLIT 77X108 STRL (DRAPES) ×6
DRAPE POUCH INSTRU U-SHP 10X18 (DRAPES) ×3 IMPLANT
DRAPE SURG 17X11 SM STRL (DRAPES) ×3 IMPLANT
DRAPE SURG ORHT 6 SPLT 77X108 (DRAPES) ×2 IMPLANT
DRAPE U-SHAPE 47X51 STRL (DRAPES) ×3 IMPLANT
DRESSING AQUACEL AG SP 3.5X10 (GAUZE/BANDAGES/DRESSINGS) IMPLANT
DRSG AQUACEL AG ADV 3.5X10 (GAUZE/BANDAGES/DRESSINGS) IMPLANT
DRSG AQUACEL AG ADV 3.5X14 (GAUZE/BANDAGES/DRESSINGS) IMPLANT
DRSG AQUACEL AG SP 3.5X10 (GAUZE/BANDAGES/DRESSINGS) ×3
DURAPREP 26ML APPLICATOR (WOUND CARE) ×3 IMPLANT
ELECT BLADE TIP CTD 4 INCH (ELECTRODE) ×3 IMPLANT
ELECT REM PT RETURN 15FT ADLT (MISCELLANEOUS) ×3 IMPLANT
FACESHIELD WRAPAROUND (MASK) ×12 IMPLANT
FACESHIELD WRAPAROUND OR TEAM (MASK) ×4 IMPLANT
GLOVE BIO SURGEON STRL SZ 6.5 (GLOVE) ×4 IMPLANT
GLOVE BIO SURGEONS STRL SZ 6.5 (GLOVE) ×2
GLOVE BIOGEL PI IND STRL 7.5 (GLOVE) ×1 IMPLANT
GLOVE BIOGEL PI IND STRL 8.5 (GLOVE) ×1 IMPLANT
GLOVE BIOGEL PI INDICATOR 7.5 (GLOVE) ×2
GLOVE BIOGEL PI INDICATOR 8.5 (GLOVE)
GLOVE ECLIPSE 8.0 STRL XLNG CF (GLOVE) ×2 IMPLANT
GLOVE INDICATOR 6.5 STRL GRN (GLOVE) ×3 IMPLANT
GOWN STRL REUS W/TWL 2XL LVL3 (GOWN DISPOSABLE) ×3 IMPLANT
GOWN STRL REUS W/TWL LRG LVL3 (GOWN DISPOSABLE) ×6 IMPLANT
HANDPIECE INTERPULSE COAX TIP (DISPOSABLE)
HEAD CERAMIC DELTA 36 PLUS 1.5 (Hips) ×2 IMPLANT
KIT BASIN OR (CUSTOM PROCEDURE TRAY) ×3 IMPLANT
KIT TURNOVER KIT A (KITS) IMPLANT
LINER NEUTRAL 54X36MM PLUS 4 (Hips) ×2 IMPLANT
MANIFOLD NEPTUNE II (INSTRUMENTS) ×3 IMPLANT
NDL SAFETY ECLIPSE 18X1.5 (NEEDLE) ×1 IMPLANT
NEEDLE HYPO 18GX1.5 SHARP (NEEDLE) ×3
NS IRRIG 1000ML POUR BTL (IV SOLUTION) ×3 IMPLANT
PACK TOTAL JOINT (CUSTOM PROCEDURE TRAY) ×3 IMPLANT
PENCIL SMOKE EVACUATOR (MISCELLANEOUS) ×3 IMPLANT
PROTECTOR NERVE ULNAR (MISCELLANEOUS) ×3 IMPLANT
SET HNDPC FAN SPRY TIP SCT (DISPOSABLE) ×1 IMPLANT
SPONGE LAP 18X18 RF (DISPOSABLE) IMPLANT
STAPLER VISISTAT 35W (STAPLE) IMPLANT
STEM FEM CMNTLSS SM AML 15.0 (Hips) ×2 IMPLANT
SUCTION FRAZIER HANDLE 12FR (TUBING) ×3
SUCTION TUBE FRAZIER 12FR DISP (TUBING) ×1 IMPLANT
SUT MNCRL AB 4-0 PS2 18 (SUTURE) ×2 IMPLANT
SUT STRATAFIX PDS+ 0 24IN (SUTURE) ×3 IMPLANT
SUT VIC AB 1 CT1 36 (SUTURE) ×3 IMPLANT
SUT VIC AB 2-0 CT1 27 (SUTURE) ×6
SUT VIC AB 2-0 CT1 TAPERPNT 27 (SUTURE) ×2 IMPLANT
TOWEL OR 17X26 10 PK STRL BLUE (TOWEL DISPOSABLE) ×6 IMPLANT
TRAY FOLEY MTR SLVR 16FR STAT (SET/KITS/TRAYS/PACK) ×1 IMPLANT
WATER STERILE IRR 1000ML POUR (IV SOLUTION) ×6 IMPLANT

## 2020-06-22 NOTE — Anesthesia Preprocedure Evaluation (Addendum)
Anesthesia Evaluation  Patient identified by MRN, date of birth, ID band Patient awake    Reviewed: Allergy & Precautions, NPO status , Patient's Chart, lab work & pertinent test results  Airway Mallampati: II  TM Distance: >3 FB Neck ROM: Full    Dental no notable dental hx.    Pulmonary neg pulmonary ROS, former smoker,    Pulmonary exam normal breath sounds clear to auscultation       Cardiovascular negative cardio ROS Normal cardiovascular exam Rhythm:Regular Rate:Normal     Neuro/Psych negative neurological ROS  negative psych ROS   GI/Hepatic Neg liver ROS, GERD  ,  Endo/Other  negative endocrine ROS  Renal/GU negative Renal ROS  negative genitourinary   Musculoskeletal negative musculoskeletal ROS (+)   Abdominal   Peds negative pediatric ROS (+)  Hematology negative hematology ROS (+)   Anesthesia Other Findings   Reproductive/Obstetrics negative OB ROS                             Anesthesia Physical Anesthesia Plan  ASA: II  Anesthesia Plan: General   Post-op Pain Management:    Induction: Intravenous  PONV Risk Score and Plan: 2 and Ondansetron and Dexamethasone  Airway Management Planned: Oral ETT  Additional Equipment:   Intra-op Plan:   Post-operative Plan: Extubation in OR  Informed Consent: I have reviewed the patients History and Physical, chart, labs and discussed the procedure including the risks, benefits and alternatives for the proposed anesthesia with the patient or authorized representative who has indicated his/her understanding and acceptance.     Dental advisory given  Plan Discussed with: CRNA and Surgeon  Anesthesia Plan Comments:        Anesthesia Quick Evaluation

## 2020-06-22 NOTE — Interval H&P Note (Signed)
History and Physical Interval Note:  06/22/2020 7:07 AM  [name]  has presented today for surgery, with the diagnosis of Failed left total hip arthroplasty.  The various methods of treatment have been discussed with the patient and family. After consideration of risks, benefits and other options for treatment, the patient has consented to  Procedure(s) with comments: TOTAL HIP REVISION (Left) - 2.5 hrs as a surgical intervention.  The patient's history has been reviewed, patient examined, no change in status, stable for surgery.  I have reviewed the patient's chart and labs.  Questions were answered to the patient's satisfaction.     Mauri Pole

## 2020-06-22 NOTE — Anesthesia Procedure Notes (Signed)
Procedure Name: Intubation Date/Time: 06/22/2020 9:04 AM Performed by: Talbot Grumbling, CRNA Pre-anesthesia Checklist: Patient identified, Emergency Drugs available, Suction available and Patient being monitored Patient Re-evaluated:Patient Re-evaluated prior to induction Oxygen Delivery Method: Circle system utilized Preoxygenation: Pre-oxygenation with 100% oxygen Induction Type: IV induction Ventilation: Mask ventilation with difficulty Laryngoscope Size: Mac and 3 Grade View: Grade I Tube type: Oral Tube size: 7.5 mm Number of attempts: 1 Airway Equipment and Method: Stylet Placement Confirmation: ETT inserted through vocal cords under direct vision,  positive ETCO2 and breath sounds checked- equal and bilateral Secured at: 23 cm Tube secured with: Tape Dental Injury: Teeth and Oropharynx as per pre-operative assessment

## 2020-06-22 NOTE — Brief Op Note (Signed)
06/22/2020  10:59 AM  PATIENT:  [name]  69 y.o. male  PRE-OPERATIVE DIAGNOSIS:  Failed left total hip arthroplasty  POST-OPERATIVE DIAGNOSIS:  Failed left total hip arthroplasty  PROCEDURE:  Procedure(s) with comments: TOTAL HIP REVISION (Left) - 2.5 hrs  SURGEON:  Surgeon(s) and Role:    Paralee Cancel, MD - Primary  PHYSICIAN ASSISTANT: Griffith Citron, PA-C  ANESTHESIA:   general  EBL:  250 mL   BLOOD ADMINISTERED:none  DRAINS: none   LOCAL MEDICATIONS USED:  NONE  SPECIMEN:  No Specimen  DISPOSITION OF SPECIMEN:  N/A  COUNTS:  YES  TOURNIQUET:  * No tourniquets in log *  DICTATION: .Other Dictation: Dictation Number [id number]  PLAN OF CARE: Admit to inpatient   PATIENT DISPOSITION:  PACU - hemodynamically stable.   Delay start of Pharmacological VTE agent (>24hrs) due to surgical blood loss or risk of bleeding: no

## 2020-06-22 NOTE — Anesthesia Postprocedure Evaluation (Signed)
Anesthesia Post Note  Patient: [name]  Procedure(s) Performed: TOTAL HIP REVISION (Left Hip)     Patient location during evaluation: PACU Anesthesia Type: General Level of consciousness: awake and alert Pain management: pain level controlled Vital Signs Assessment: post-procedure vital signs reviewed and stable Respiratory status: spontaneous breathing, nonlabored ventilation, respiratory function stable and patient connected to nasal cannula oxygen Cardiovascular status: blood pressure returned to baseline and stable Postop Assessment: no apparent nausea or vomiting Anesthetic complications: no   No complications documented.  Last Vitals:  Vitals:   06/22/20 1115 06/22/20 1130  BP: 102/64 (!) 108/56  Pulse: 62 64  Resp: 13 12  Temp: 36.5 C   SpO2: 99% 99%    Last Pain:  Vitals:   06/22/20 1115  TempSrc:   PainSc: Asleep                 Shulamit Donofrio S

## 2020-06-22 NOTE — Discharge Instructions (Signed)

## 2020-06-22 NOTE — Evaluation (Signed)
Physical Therapy Evaluation Patient Details Name: [name] MRN: [id number] DOB: 01-02-1951 Today's Date: 06/22/2020   History of Present Illness  Patient is 69 y.o. male s/p Lt THR on 06/22/20 with PMH significant for GERD, OA, HLD, Lt THA in 2013.   Clinical Impression  KASEY EWINGS is a 69 y.o. male POD 0 s/p Lt THR. Patient reports independence with mobility at baseline. Patient is now limited by functional impairments (see PT problem list below) and requires min assist/guard for transfers and gait with RW. Patient was able to ambulate ~180 feet with RW and min guard/assist. Patient instructed in exercise to facilitate strength and circulation and educated on posterior hip precautions. Patient will benefit from continued skilled PT interventions to address impairments and progress towards PLOF. Acute PT will follow to progress mobility and stair training in preparation for safe discharge home.     Follow Up Recommendations Follow surgeon's recommendation for DC plan and follow-up therapies;Outpatient PT    Equipment Recommendations  Rolling walker with 5" wheels    Recommendations for Other Services       Precautions / Restrictions Precautions Precautions: Fall;Posterior Hip Precaution Booklet Issued: Yes (comment) Restrictions Weight Bearing Restrictions: No LLE Weight Bearing: Weight bearing as tolerated      Mobility  Bed Mobility Overal bed mobility: Needs Assistance Bed Mobility: Supine to Sit     Supine to sit: HOB elevated;Min guard     General bed mobility comments: cues for safe sequencing to maintain posterior hip precautions    Transfers Overall transfer level: Needs assistance Equipment used: Rolling walker (2 wheeled) Transfers: Sit to/from Stand Sit to Stand: Min assist;Min guard;From elevated surface         General transfer comment: cues for technique to maintain posterior hip precautions, min assist/close guard for safety and to steady in  standing.   Ambulation/Gait Ambulation/Gait assistance: Min assist;Min guard Gait Distance (Feet): 180 Feet Assistive device: Rolling walker (2 wheeled) Gait Pattern/deviations: Step-to pattern;Decreased stride length;Decreased weight shift to left Gait velocity: fair   General Gait Details: cues for safe step pattern/proximity and for safety as pt has tendency to quickly push walker forward.   Stairs            Wheelchair Mobility    Modified Rankin (Stroke Patients Only)       Balance Overall balance assessment: Needs assistance Sitting-balance support: Feet supported Sitting balance-Leahy Scale: Good     Standing balance support: During functional activity;Bilateral upper extremity supported Standing balance-Leahy Scale: Fair                               Pertinent Vitals/Pain Pain Assessment: 0-10 Pain Score: 1  Pain Location: Lt hip Pain Descriptors / Indicators: Aching;Discomfort Pain Intervention(s): Limited activity within patient's tolerance;Monitored during session;Repositioned;Ice applied    Home Living Family/patient expects to be discharged to:: Private residence Living Arrangements: Spouse/significant other Available Help at Discharge: Family Type of Home: House Home Access: Stairs to enter Entrance Stairs-Rails: Right Entrance Stairs-Number of Steps: 2 Home Layout: Multi-level;Full bath on main level;Able to live on main level with bedroom/bathroom Home Equipment: Cane - single point;Crutches;Bedside commode      Prior Function Level of Independence: Independent         Comments: pt enjoys golfing and going to the gym 3-4x/week     Hand Dominance   Dominant Hand: Right    Extremity/Trunk Assessment   Upper Extremity Assessment  Upper Extremity Assessment: Overall WFL for tasks assessed    Lower Extremity Assessment Lower Extremity Assessment: Overall WFL for tasks assessed    Cervical / Trunk Assessment Cervical  / Trunk Assessment: Normal  Communication   Communication: No difficulties  Cognition Arousal/Alertness: Awake/alert Behavior During Therapy: WFL for tasks assessed/performed Overall Cognitive Status: Within Functional Limits for tasks assessed                                        General Comments      Exercises Total Joint Exercises Ankle Circles/Pumps: AROM;Both;20 reps;Seated Quad Sets: AROM;Left;10 reps;Seated   Assessment/Plan    PT Assessment Patient needs continued PT services  PT Problem List Decreased strength;Decreased range of motion;Decreased activity tolerance;Decreased balance;Decreased mobility;Decreased knowledge of use of DME;Decreased knowledge of precautions       PT Treatment Interventions DME instruction;Gait training;Stair training;Functional mobility training;Therapeutic activities;Therapeutic exercise;Balance training;Patient/family education    PT Goals (Current goals can be found in the Care Plan section)  Acute Rehab PT Goals Patient Stated Goal: get back to golfing PT Goal Formulation: With patient Time For Goal Achievement: 06/29/20 Potential to Achieve Goals: Good    Frequency 7X/week   Barriers to discharge        Co-evaluation               AM-PAC PT "6 Clicks" Mobility  Outcome Measure Help needed turning from your back to your side while in a flat bed without using bedrails?: None Help needed moving from lying on your back to sitting on the side of a flat bed without using bedrails?: A Little Help needed moving to and from a bed to a chair (including a wheelchair)?: A Little Help needed standing up from a chair using your arms (e.g., wheelchair or bedside chair)?: A Little Help needed to walk in hospital room?: A Little Help needed climbing 3-5 steps with a railing? : A Little 6 Click Score: 19    End of Session Equipment Utilized During Treatment: Gait belt Activity Tolerance: Patient tolerated treatment  well Patient left: in chair;with call bell/phone within reach;with chair alarm set   PT Visit Diagnosis: Difficulty in walking, not elsewhere classified (R26.2);Muscle weakness (generalized) (M62.81)    Time: 1633-1700 PT Time Calculation (min) (ACUTE ONLY): 27 min   Charges:   PT Evaluation $PT Eval Low Complexity: 1 Low PT Treatments $Gait Training: 8-22 mins        Verner Mould, DPT Acute Rehabilitation Services  Office [phone] Pager [phone]  06/22/2020 6:25 PM

## 2020-06-22 NOTE — Transfer of Care (Signed)
Immediate Anesthesia Transfer of Care Note  Patient: [name]  Procedure(s) Performed: TOTAL HIP REVISION (Left Hip)  Patient Location: PACU  Anesthesia Type:General  Level of Consciousness: sedated  Airway & Oxygen Therapy: Patient Spontanous Breathing and Patient connected to face mask oxygen  Post-op Assessment: Report given to RN and Post -op Vital signs reviewed and stable  Post vital signs: Reviewed and stable  Last Vitals:  Vitals Value Taken Time  BP    Temp    Pulse 62 06/22/20 1115  Resp 9 06/22/20 1115  SpO2 99 % 06/22/20 1115  Vitals shown include unvalidated device data.  Last Pain:  Vitals:   06/22/20 0655  TempSrc:   PainSc: 0-No pain      Patients Stated Pain Goal: 5 (29/51/88 4166)  Complications: No complications documented.

## 2020-06-23 ENCOUNTER — Encounter (HOSPITAL_COMMUNITY): Payer: Self-pay | Admitting: Orthopedic Surgery

## 2020-06-23 DIAGNOSIS — T84091A Other mechanical complication of internal left hip prosthesis, initial encounter: Secondary | ICD-10-CM | POA: Diagnosis not present

## 2020-06-23 DIAGNOSIS — Z87891 Personal history of nicotine dependence: Secondary | ICD-10-CM | POA: Diagnosis not present

## 2020-06-23 DIAGNOSIS — Z96643 Presence of artificial hip joint, bilateral: Secondary | ICD-10-CM | POA: Diagnosis not present

## 2020-06-23 DIAGNOSIS — Z85828 Personal history of other malignant neoplasm of skin: Secondary | ICD-10-CM | POA: Diagnosis not present

## 2020-06-23 LAB — BASIC METABOLIC PANEL
Anion gap: 7 (ref 5–15)
BUN: 14 mg/dL (ref 8–23)
CO2: 25 mmol/L (ref 22–32)
Calcium: 8.7 mg/dL — ABNORMAL LOW (ref 8.9–10.3)
Chloride: 103 mmol/L (ref 98–111)
Creatinine, Ser: 1.12 mg/dL (ref 0.61–1.24)
GFR, Estimated: >60 mL/min (ref >60)
Glucose, Bld: 155 mg/dL — ABNORMAL HIGH (ref 70–99)
Potassium: 4.4 mmol/L (ref 3.5–5.1)
Sodium: 135 mmol/L (ref 135–145)

## 2020-06-23 LAB — CBC
HCT: 38.1 % — ABNORMAL LOW (ref 39.0–52.0)
Hemoglobin: 13 g/dL (ref 13.0–17.0)
MCH: 33.1 pg (ref 26.0–34.0)
MCHC: 34.1 g/dL (ref 30.0–36.0)
MCV: 96.9 fL (ref 80.0–100.0)
Platelets: 143 10*3/uL — ABNORMAL LOW (ref 150–400)
RBC: 3.93 MIL/uL — ABNORMAL LOW (ref 4.22–5.81)
RDW: 12.3 % (ref 11.5–15.5)
WBC: 10.5 10*3/uL (ref 4.0–10.5)
nRBC: 0 % (ref 0.0–0.2)

## 2020-06-23 MED ORDER — POLYETHYLENE GLYCOL 3350 17 G PO PACK
17.0000 g | PACK | Freq: Two times a day (BID) | ORAL | 0 refills | Status: DC
Start: 2020-06-23 — End: 2020-09-01

## 2020-06-23 MED ORDER — ASPIRIN 81 MG PO CHEW: 81.0000 mg | CHEWABLE_TABLET | Freq: Two times a day (BID) | ORAL | 0 refills | Status: AC

## 2020-06-23 MED ORDER — FERROUS SULFATE 325 (65 FE) MG PO TABS: 325.0000 mg | ORAL_TABLET | Freq: Three times a day (TID) | ORAL | 0 refills | Status: DC

## 2020-06-23 MED ORDER — DOCUSATE SODIUM 100 MG PO CAPS
100.0000 mg | ORAL_CAPSULE | Freq: Two times a day (BID) | ORAL | 0 refills | Status: DC
Start: 2020-06-23 — End: 2020-09-01

## 2020-06-23 MED ORDER — METHOCARBAMOL 500 MG PO TABS
500.0000 mg | ORAL_TABLET | Freq: Four times a day (QID) | ORAL | 0 refills | Status: DC | PRN
Start: 2020-06-23 — End: 2020-09-01

## 2020-06-23 MED ORDER — HYDROCODONE-ACETAMINOPHEN 5-325 MG PO TABS
1.0000 | ORAL_TABLET | ORAL | 0 refills | Status: DC | PRN
Start: 2020-06-23 — End: 2020-09-01

## 2020-06-23 NOTE — Op Note (Signed)
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  PROCEDURE:  Revision left total hip arthroplasty.  COMPONENTS USED:  DePuy hip system with a new 54 x 36+4 neutral AltrX liner, a size 15 small stature AML stem with a 36+1.5 delta ceramic ball.  SURGEON:  Paralee Cancel, MD  ASSISTANT:  Griffith Citron, PA-C.  Note that Ms. Nehemiah Settle was present for the entirety of the case from preoperative positioning, perioperative management of the operative extremity, general facilitation of the case and primary wound closure.  ANESTHESIA:  General.  BLOOD LOSS:  250 mL.  DRAINS:  None.  COMPLICATIONS:  None.  INDICATIONS:  The patient is a pleasant 70 year old male with history of left total hip arthroplasty about 4 years ago.  He presented recently with concerns regarding the knee and left hip and thigh pain.  Radiographic workup of the knees was negative  for any significant pathology and subsequent evaluation of his hip revealed concerns regarding subsidence of the femoral component and lack of bony ingrowth.  This was confirmed by bone scan.  Given the persistence of the discomfort for a while he has  noted as well as progressive worsening, he has sought evaluation.  Based on the findings, I recommended revision of his femoral component as well as the acetabular liner given the duration now from his index surgery.  We discussed the risks of failure of  further bony ingrowth into the new component.  We discussed standard risk of infection, DVT,  dislocation, neurovascular injury, and the need for future surgeries.  Consent was obtained for the benefit of pain relief.  DESCRIPTION OF PROCEDURE:  The patient was brought to the operative theater.  Once adequate anesthesia, preoperative antibiotics, Ancef administered as well as tranexamic acid and Decadron, he was positioned into the right lateral decubitus position with  the left hip up.  I elected to perform a posterior approach to the hip at this point related to the possible need for extended trochanteric osteotomy and the potential need for use of straight stems.  The left hip was then prepped and draped in sterile  fashion.  A timeout was performed identifying the patient, the planned procedure and extremity.  Landmarks were identified and the skin marked for posterior approach to the hip.  Incision was made for the posterior approach.  The iliotibial band and  gluteal fascia were split for posterior approach.  The posterior aspect of the hip was then exposed encountering clear synovial fluid without concern for infection.  The posterior capsule and short external rotators were taken down as a single layer.   Once I exposed the posterior two-thirds of the hip, we dislocated the hip and removed the femoral head.  At this point, the femoral component did not obviously come out.  With the femur flexed and internally rotated with retractors placed, I used thin  osteotomes to work around the anterior and posterior aspect of the previously placed femoral stem.  I then debrided bone around the lateral aspect of the shoulder of the femoral component and the greater trochanter.  When I was doing this, I did identify  that the stem now appeared loose.  I was able to use a device used for impaction of the component that was screwed into the top of the femoral component and was able to remove the femoral component without complication.  Given the radiographic findings  of the pedestal formation, I had to  open up and use the [place] cemented drill set, which included a 4.5, 6, 8 mm drills.  At this point, the femur was relaxed and placed in a neutral position with retractors placed around the acetabulum.  I exposed  fully around the acetabulum and then removed the old acetabular liner.  I removed the hole eliminator and placed a trial liner for trial purposes during this case.  Once this was done, I returned focused to the femur.  Femoral preparation was carried out  using the aforementioned drills to get through the pedestal and then straight reamers for the AML stem.  I elected to go with a straight stem due to concerns regarding the proximal femoral bone as well as the nature of the fibrous tissues that were  identified and debrided.  I was worried about failure of further ingrowth using a proximally loaded stem.  At this point, I reamed up from the 8 mm straight reamer up to 14.5 mm.  I had good bone preparation distally.  I then did a trial reduction with a  15 small stature broach, standard neck and a 36+1.5 ball.  Here, I found the hip was stable throughout range of motion.  Based on where the broach was, his leg lengths appeared to be restored given the subsidence of the previously placed femoral  component.  Given all these findings, we removed all the trial components including the trial acetabular liner.  A new acetabular liner was placed for a 54 cup with a 36+4 neutral AltrX liner.  At this point, a 15 small stature AML stem was also opened.   It was then impacted using the guidance of a reamer within the hole of the component to guide the component into the previous position of the prior component.  It was then impacted and sat at the level where the broach was.  I did do a repeat trial  reduction.  I was happy with the height and position of the femoral head in relationship to the tip of the trochanter.  Again, leg lengths appeared to be improved from the preoperative positioning.  The range of  motion and was stable without impingement  or early subluxation concerns.  Given these findings, the final 36+1.5 ball was impacted onto a clean and dried trunnion and the hip was reduced.  The hip was irrigated throughout the case and again at this point.  The wound was closed in layers.  I did  reapproximate the posterior capsule to the superior leaflet using #1 Vicryl.  The remainder of the wound was closed in layers using #1 Vicryl and #1 Stratafix suture on the gluteal fascia and iliotibial band.  The remaining wound was closed with 2-0  Vicryl and a running Monocryl stitch.  Hip was then cleaned, dried and dressed sterilely using surgical glue and Aquacel dressing.  The patient was then brought to the recovery room in stable condition, tolerating the procedure well.  Postoperatively, we  will allow him to be weightbearing as tolerated.  Physical therapy will work with him on posterior hip precautions.  Likely, he will be discharged tomorrow given stability and recovery.  IN/NUANCE  D:06/22/2020 T:06/23/2020 JOB:[id number]

## 2020-06-23 NOTE — TOC Transition Note (Signed)
Transition of Care [name]) - CM/SW Discharge Note   Patient Details  Name: [name] MRN: [id number] Date of Birth: 24-Mar-1951  [facility] CM/SW Contact:  Lia Hopping, [place] Phone Number: 06/23/2020, 10:13 AM   Clinical Narrative:    Therapy Plan:HEP RW ordered through [place] and delivered to the patient bedside.   Final next level of care: Home/Self Care Barriers to Discharge: Barriers Resolved   Patient Goals and CMS Choice        Discharge Placement                       Discharge Plan and Services                DME Arranged: Walker rolling DME Agency: [employer] Date DME Agency Contacted: 06/23/20 Time DME Agency Contacted: 0900 Representative spoke with at DME Agency: [place] ([place]) Interventions     Readmission Risk Interventions No flowsheet data found.

## 2020-06-23 NOTE — Progress Notes (Signed)
Pt provided with d/c instructions. After discussing the pt's plan of care upon d/c home, pt denied any further questions or concerns.  

## 2020-06-23 NOTE — Progress Notes (Signed)
Physical Therapy Treatment Patient Details Name: [name] MRN: [id number] DOB: [birth year] Today's Date: 06/23/2020    History of Present Illness Patient is 69 y.o. male s/p Lt THR on 06/22/20 with PMH significant for GERD, OA, HLD, Lt THA in 2013.     PT Comments    Pt continues to progress well with mobility and is eager for return home.  Pt up to ambulate in hall, negotiated stairs, reviewed HEP including progression, and reviewed car transfers.  Written handouts for HEP and stairs provided and reviewed.   Follow Up Recommendations  Follow surgeon's recommendation for DC plan and follow-up therapies;Outpatient PT     Equipment Recommendations  Rolling walker with 5" wheels    Recommendations for Other Services       Precautions / Restrictions Precautions Precautions: Fall;Posterior Hip Precaution Booklet Issued: Yes (comment) Precaution Comments: Pt recalls all THP Restrictions Weight Bearing Restrictions: No LLE Weight Bearing: Weight bearing as tolerated    Mobility  Bed Mobility Overal bed mobility: Needs Assistance Bed Mobility: Supine to Sit     Supine to sit: Supervision     General bed mobility comments: Pt up in chair and requests back to same  Transfers Overall transfer level: Needs assistance Equipment used: Rolling walker (2 wheeled) Transfers: Sit to/from Stand Sit to Stand: Supervision         General transfer comment: cues for technique to maintain posterior hip precautions,   Ambulation/Gait Ambulation/Gait assistance: Supervision Gait Distance (Feet): 200 Feet Assistive device: Rolling walker (2 wheeled) Gait Pattern/deviations: Step-to pattern;Decreased stride length;Decreased weight shift to left Gait velocity: fair   General Gait Details: cues for posture, position from RW and initial sequence.  Additional cues to slow for safety esp in turns to allow adherence to THP   Stairs Stairs: Yes Stairs assistance: Min guard Stair  Management: One rail Right;Step to pattern;Forwards;With cane Number of Stairs: 5 General stair comments: cues for sequence and cane/foot placement   Wheelchair Mobility    Modified Rankin (Stroke Patients Only)       Balance Overall balance assessment: Needs assistance Sitting-balance support: Feet supported Sitting balance-Leahy Scale: Good     Standing balance support: During functional activity;Bilateral upper extremity supported Standing balance-Leahy Scale: Fair                              Cognition Arousal/Alertness: Awake/alert Behavior During Therapy: WFL for tasks assessed/performed Overall Cognitive Status: Within Functional Limits for tasks assessed                                        Exercises Total Joint Exercises Ankle Circles/Pumps: AROM;Both;20 reps;Seated Quad Sets: AROM;Left;10 reps;Seated Heel Slides: AAROM;AROM;Left;20 reps;Supine Hip ABduction/ADduction: AAROM;AROM;Left;15 reps;Supine Long Arc Quad: AROM;Left;10 reps;Seated    General Comments        Pertinent Vitals/Pain Pain Assessment: 0-10 Pain Score: 1  Pain Location: Lt hip Pain Descriptors / Indicators: Aching;Discomfort Pain Intervention(s): Limited activity within patient's tolerance;Monitored during session;Premedicated before session;Ice applied    Home Living                      Prior Function            PT Goals (current goals can now be found in the care plan section) Acute Rehab PT Goals Patient Stated Goal: get back  to golfing PT Goal Formulation: With patient Time For Goal Achievement: 06/29/20 Potential to Achieve Goals: Good Progress towards PT goals: Progressing toward goals    Frequency    7X/week      PT Plan Current plan remains appropriate    Co-evaluation              AM-PAC PT "6 Clicks" Mobility   Outcome Measure  Help needed turning from your back to your side while in a flat bed without using  bedrails?: None Help needed moving from lying on your back to sitting on the side of a flat bed without using bedrails?: A Little Help needed moving to and from a bed to a chair (including a wheelchair)?: A Little Help needed standing up from a chair using your arms (e.g., wheelchair or bedside chair)?: A Little Help needed to walk in hospital room?: A Little Help needed climbing 3-5 steps with a railing? : A Little 6 Click Score: 19    End of Session Equipment Utilized During Treatment: Gait belt Activity Tolerance: Patient tolerated treatment well Patient left: in chair;with call bell/phone within reach;with chair alarm set Nurse Communication: Mobility status PT Visit Diagnosis: Difficulty in walking, not elsewhere classified (R26.2);Muscle weakness (generalized) (M62.81)     Time: 1029-1100 PT Time Calculation (min) (ACUTE ONLY): 31 min  Charges:  $Gait Training: 8-22 mins $Therapeutic Exercise: 8-22 mins $Therapeutic Activity: 8-22 mins                     Debe Coder PT Acute Rehabilitation Services Pager [phone] Office [phone]    [name] 06/23/2020, 11:22 AM

## 2020-06-23 NOTE — Progress Notes (Signed)
Physical Therapy Treatment Patient Details Name: [name] MRN: [id number] DOB: 1950-11-07 Today's Date: 06/23/2020    History of Present Illness Patient is 69 y.o. male s/p Lt THR on 06/22/20 with PMH significant for GERD, OA, HLD, Lt THA in 2013.     PT Comments    Pt very motivated and progressing well with mobility but moderate cues to slow for safety and to insure adherence to THP.   Follow Up Recommendations  Follow surgeons recommendation for DC plan and follow-up therapies;Outpatient PT     Equipment Recommendations  Rolling walker with 5" wheels    Recommendations for Other Services       Precautions / Restrictions Precautions Precautions: Fall;Posterior Hip Precaution Booklet Issued: Yes (comment) Precaution Comments: Pt recalled 2/3 THP without cues - all precautions reviewed Restrictions Weight Bearing Restrictions: No LLE Weight Bearing: Weight bearing as tolerated    Mobility  Bed Mobility Overal bed mobility: Needs Assistance Bed Mobility: Supine to Sit     Supine to sit: Supervision     General bed mobility comments: cues for sequence, use of R LE to self assist and adherence to THP  Transfers Overall transfer level: Needs assistance Equipment used: Rolling walker (2 wheeled) Transfers: Sit to/from Stand Sit to Stand: Min guard;From elevated surface         General transfer comment: cues for technique to maintain posterior hip precautions,   Ambulation/Gait Ambulation/Gait assistance: Min guard;Supervision Gait Distance (Feet): 240 Feet Assistive device: Rolling walker (2 wheeled) Gait Pattern/deviations: Step-to pattern;Decreased stride length;Decreased weight shift to left Gait velocity: fair   General Gait Details: cues for posture, position from RW and initial sequence.  Additional cues to slow for safety esp in turns to allow adherence to THP   Stairs             Wheelchair Mobility    Modified Rankin (Stroke  Patients Only)       Balance Overall balance assessment: Needs assistance Sitting-balance support: Feet supported Sitting balance-Leahy Scale: Good     Standing balance support: During functional activity;Bilateral upper extremity supported Standing balance-Leahy Scale: Fair                              Cognition Arousal/Alertness: Awake/alert Behavior During Therapy: WFL for tasks assessed/performed Overall Cognitive Status: Within Functional Limits for tasks assessed                                        Exercises Total Joint Exercises Ankle Circles/Pumps: AROM;Both;20 reps;Seated Quad Sets: AROM;Left;10 reps;Seated Heel Slides: AAROM;AROM;Left;20 reps;Supine Hip ABduction/ADduction: AAROM;AROM;Left;15 reps;Supine Long Arc Quad: AROM;Left;10 reps;Seated    General Comments        Pertinent Vitals/Pain Pain Assessment: 0-10 Pain Score: 1  Pain Location: Lt hip Pain Descriptors / Indicators: Aching;Discomfort Pain Intervention(s): Limited activity within patient's tolerance;Monitored during session;Premedicated before session;Ice applied    Home Living                      Prior Function            PT Goals (current goals can now be found in the care plan section) Acute Rehab PT Goals Patient Stated Goal: get back to golfing PT Goal Formulation: With patient Time For Goal Achievement: 06/29/20 Potential to Achieve Goals: Good Progress towards PT goals:  Progressing toward goals    Frequency    7X/week      PT Plan Current plan remains appropriate    Co-evaluation              AM-PAC PT "6 Clicks" Mobility   Outcome Measure  Help needed turning from your back to your side while in a flat bed without using bedrails?: None Help needed moving from lying on your back to sitting on the side of a flat bed without using bedrails?: A Little Help needed moving to and from a bed to a chair (including a  wheelchair)?: A Little Help needed standing up from a chair using your arms (e.g., wheelchair or bedside chair)?: A Little Help needed to walk in hospital room?: A Little Help needed climbing 3-5 steps with a railing? : A Little 6 Click Score: 19    End of Session Equipment Utilized During Treatment: Gait belt Activity Tolerance: Patient tolerated treatment well Patient left: in chair;with call bell/phone within reach;with chair alarm set Nurse Communication: Mobility status PT Visit Diagnosis: Difficulty in walking, not elsewhere classified (R26.2);Muscle weakness (generalized) (M62.81)     Time: 5465-0354 PT Time Calculation (min) (ACUTE ONLY): 36 min  Charges:  $Gait Training: 8-22 mins $Therapeutic Exercise: 8-22 mins                     Debe Coder PT Acute Rehabilitation Services Pager [phone] Office [phone]    Kerolos Nehme 06/23/2020, 11:17 AM

## 2020-06-23 NOTE — Care Management CC44 (Signed)
Condition Code 44 Documentation Completed  Patient Details  Name: [name] MRN: [id number] Date of Birth: Aug 07, 1950   Condition Code 44 given:  Yes Patient signature on Condition Code 44 notice:  Yes Documentation of 2 MD's agreement:  Yes Code 44 added to claim:  Yes    Lia Hopping, LCSW 06/23/2020, 10:11 AM

## 2020-06-23 NOTE — Care Management Obs Status (Signed)
[place] NOTIFICATION   Patient Details  Name: [name] MRN: [id number] Date of Birth: 06-26-1951   Medicare Observation Status Notification Given:  Yes    Lia Hopping, [place] 06/23/2020, 10:11 AM

## 2020-06-23 NOTE — Plan of Care (Signed)

## 2020-06-23 NOTE — Progress Notes (Signed)
Patient ID: KA FLAMMER, male   DOB: Feb 26, 1951, 69 y.o.   MRN: [id number] Subjective: 1 Day Post-Op Procedure(s) (LRB): TOTAL HIP REVISION (Left)    Patient reports pain as mild. Notes some thigh pain. No events over night   Objective:   VITALS:   Vitals:   06/23/20 0158 06/23/20 0613  BP: 108/66 104/62  Pulse: 72 67  Resp: 16 18  Temp: 98.5 F (36.9 C) 98 F (36.7 C)  SpO2: 96% 96%    Neurovascular intact Incision: dressing C/D/I  LABS Recent Labs    06/23/20 0411  HGB 13.0  HCT 38.1*  WBC 10.5  PLT 143*    Recent Labs    06/23/20 0411  NA 135  K 4.4  BUN 14  CREATININE 1.12  GLUCOSE 155*    No results for input(s): LABPT, INR in the last 72 hours.   Assessment/Plan: 1 Day Post-Op Procedure(s) (LRB): TOTAL HIP REVISION (Left)   Advance diet Up with therapy  WBAT  Reviewed hip precautions that he will likely follow for about 3 months Rx at discharge RTC in 2 weeks

## 2020-06-28 NOTE — Discharge Summary (Signed)
Physician Discharge Summary   Patient ID: [name] MRN: [id number] DOB/AGE: August 19, 1950 69 y.o.  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  Admission Diagnoses:  Past Medical History:  Diagnosis Date  . Actinic keratosis   . Arthritis    hip , shoulders   . Cancer (HCC)    hx of skin cancer   . Diverticulitis   . GERD (gastroesophageal reflux disease)   . Hyperlipidemia   . Osteoarthritis    left Hip  . Pneumonia    hx of years ago   . Polyp, sigmoid colon   . Testosterone deficiency 2012   Discharge Diagnoses:   Active Problems:   S/P revision of left total hip  Estimated body mass index is 26.45 kg/m as calculated from the following:   Height as of this encounter: 6' (1.829 m).   Weight as of this encounter: 88.5 kg.  Procedure:  Procedure(s) (LRB): TOTAL HIP REVISION (Left)   Consults: None  HPI: The patient is a pleasant 69 year old male with history of left total hip arthroplasty about 4 years ago.  He presented recently with concerns regarding the knee and left hip and thigh pain.  Radiographic workup of the knees was negative for any significant pathology and subsequent evaluation of his hip revealed concerns regarding subsidence of the femoral component and lack of bony ingrowth.  This was confirmed by bone scan.  Given the persistence of the discomfort for a while he has noted as well as progressive worsening, he has sought evaluation.  Based on the findings, I recommended revision of his femoral component as well as the acetabular liner given the duration now from his index surgery.  We discussed the risks of failure of further bony ingrowth into the new component.  We discussed standard risk of infection, DVT, dislocation, neurovascular injury, and the need for future surgeries.  Consent was obtained for the  benefit of pain relief.  Laboratory Data: Admission on 06/22/2020, Discharged on 06/23/2020  Component Date Value Ref Range Status  . WBC 06/23/2020 10.5  4.0 - 10.5 K/uL Final  . RBC 06/23/2020 3.93* 4.22 - 5.81 MIL/uL Final  . Hemoglobin 06/23/2020 13.0  13.0 - 17.0 g/dL Final  . HCT 06/23/2020 38.1* 39 - 52 % Final  . MCV 06/23/2020 96.9  80.0 - 100.0 fL Final  . MCH 06/23/2020 33.1  26.0 - 34.0 pg Final  . MCHC 06/23/2020 34.1  30.0 - 36.0 g/dL Final  . RDW 06/23/2020 12.3  11.5 - 15.5 % Final  . Platelets 06/23/2020 143* 150 - 400 K/uL Final  . nRBC 06/23/2020 0.0  0.0 - 0.2 % Final   Performed at [facility], [place] [address], [place], [place] [address]  . Sodium 06/23/2020 135  135 - 145 mmol/L Final  . Potassium 06/23/2020 4.4  3.5 - 5.1 mmol/L Final  . Chloride 06/23/2020 103  98 - 111 mmol/L Final  . CO2 06/23/2020 25  22 - 32 mmol/L Final  . Glucose, Bld 06/23/2020 155* 70 - 99 mg/dL Final   Glucose reference range applies only to samples taken after fasting for at least 8 hours.  . BUN 06/23/2020 14  8 - 23 mg/dL Final  . Creatinine, Ser 06/23/2020 1.12  0.61 - 1.24 mg/dL Final  . Calcium 06/23/2020 8.7* 8.9 - 10.3 mg/dL Final  . GFR, Estimated 06/23/2020 >  60  >60 mL/min Final   Comment: (NOTE) Calculated using the CKD-EPI Creatinine Equation (2021)   . Anion gap 06/23/2020 7  5 - 15 Final   Performed at [facility], [place] [address] [place], [place] [address]  Hospital Outpatient Visit on 06/19/2020  Component Date Value Ref Range Status  . SARS Coronavirus 2 06/19/2020 NEGATIVE  NEGATIVE Final   Comment: (NOTE) SARS-CoV-2 target nucleic acids are NOT DETECTED.  The SARS-CoV-2 RNA is generally detectable in upper and lower respiratory specimens during the acute phase of infection. Negative results do not preclude SARS-CoV-2 infection, do not rule out co-infections with other pathogens, and should not be used as the sole basis  for treatment or other patient management decisions. Negative results must be combined with clinical observations, patient history, and epidemiological information. The expected result is Negative.  Fact Sheet for Patients: SugarRoll.be  Fact Sheet for Healthcare Providers: https://www.woods-mathews.com/  This test is not yet approved or cleared by the [place] FDA and  has been authorized for detection and/or diagnosis of SARS-CoV-2 by FDA under an Emergency Use Authorization (EUA). This EUA will remain  in effect (meaning this test can be used) for the duration of the COVID-19 declaration under Se                          ction 564(b)(1) of the Act, 21 U.S.C. section 360bbb-3(b)(1), unless the authorization is terminated or revoked sooner.  Performed at [facility], [place] [address] [name], [place] [address]   Hospital Outpatient Visit on 06/14/2020  Component Date Value Ref Range Status  . aPTT 06/14/2020 36  24 - 36 seconds Final   Performed at [facility], [place] [address] [place], [place] [address]  . Sodium 06/14/2020 138  135 - 145 mmol/L Final  . Potassium 06/14/2020 4.6  3.5 - 5.1 mmol/L Final  . Chloride 06/14/2020 104  98 - 111 mmol/L Final  . CO2 06/14/2020 25  22 - 32 mmol/L Final  . Glucose, Bld 06/14/2020 119* 70 - 99 mg/dL Final   Glucose reference range applies only to samples taken after fasting for at least 8 hours.  . BUN 06/14/2020 14  8 - 23 mg/dL Final  . Creatinine, Ser 06/14/2020 1.09  0.61 - 1.24 mg/dL Final  . Calcium 06/14/2020 9.5  8.9 - 10.3 mg/dL Final  . GFR, Estimated 06/14/2020 >60  >60 mL/min Final   Comment: (NOTE) Calculated using the CKD-EPI Creatinine Equation (2021)   . Anion gap 06/14/2020 9  5 - 15 Final   Performed at [facility], [place] [address], [place], [place] [address]  . WBC 06/14/2020 4.2  4.0 - 10.5 K/uL Final  . RBC 06/14/2020 4.78   4.22 - 5.81 MIL/uL Final  . Hemoglobin 06/14/2020 15.6  13.0 - 17.0 g/dL Final  . HCT 06/14/2020 45.4  39 - 52 % Final  . MCV 06/14/2020 95.0  80.0 - 100.0 fL Final  . MCH 06/14/2020 32.6  26.0 - 34.0 pg Final  . MCHC 06/14/2020 34.4  30.0 - 36.0 g/dL Final  . RDW 06/14/2020 12.2  11.5 - 15.5 % Final  . Platelets 06/14/2020 165  150 - 400 K/uL Final  . nRBC 06/14/2020 0.0  0.0 - 0.2 % Final   Performed at [facility], [place] [address] [place], [place] [address]  . Prothrombin Time 06/14/2020 12.7  11.4 - 15.2  seconds Final  . INR 06/14/2020 1.0  0.8 - 1.2 Final   Comment: (NOTE) INR goal varies based on device and disease states. Performed at [facility], [place] [address] [place], [place] [address]   . ABO/RH(D) 06/14/2020 O POS   Final  . Antibody Screen 06/14/2020 NEG   Final  . Sample Expiration 06/14/2020 06/25/2020,2359   Final  . Extend sample reason 06/14/2020    Final                   Value:NO TRANSFUSIONS OR PREGNANCY IN THE PAST 3 MONTHS Performed at [facility], [place] [address], [place], [place] [address]   . MRSA, PCR 06/14/2020 NEGATIVE  NEGATIVE Final  . Staphylococcus aureus 06/14/2020 NEGATIVE  NEGATIVE Final   Comment: (NOTE) The Xpert SA Assay (FDA approved for NASAL specimens in patients 37 years of age and older), is one component of a comprehensive surveillance program. It is not intended to diagnose infection nor to guide or monitor treatment. Performed at [facility], [place] [address] [place], [place] [address]      X-Rays:DG Chest 2 View  Result Date: 06/16/2020 CLINICAL DATA:  69 year old male with preoperative chest x-ray EXAM: CHEST - 2 VIEW COMPARISON:  07/01/2018 FINDINGS: Cardiomediastinal silhouette unchanged in size and contour. No central vascular congestion. No pleural effusion or confluent airspace disease. Note that the apices of the lungs have been excluded,  however, there does not appear to be pneumothorax. Similar appearance of coarsened interstitial markings. No acute displaced fracture IMPRESSION: Negative for acute cardiopulmonary disease Electronically Signed   By: Corrie Mckusick D.O.   On: 06/16/2020 16:25   DG Pelvis Portable  Result Date: 06/22/2020 CLINICAL DATA:  Status post left total hip replacement. EXAM: PORTABLE PELVIS 1-2 VIEWS COMPARISON:  10/01/2011. FINDINGS: Left total hip prosthesis in satisfactory position and alignment. No fracture or dislocation seen. Lumbosacral spine degenerative changes. IMPRESSION: Satisfactory postoperative appearance of a left total hip prosthesis. Electronically Signed   By: Claudie Revering M.D.   On: 06/22/2020 11:51    EKG: Orders placed or performed in visit on 06/16/20  . EKG 12-Lead     Hospital Course: [name] is a 69 y.o. who was admitted to [facility]. They were brought to the operating room on 06/22/2020 and underwent Procedure(s): TOTAL HIP REVISION.  Patient tolerated the procedure well and was later transferred to the recovery room and then to the orthopaedic floor for postoperative care. They were given PO and IV analgesics for pain control following their surgery. They were given 24 hours of postoperative antibiotics of  Anti-infectives (From admission, onward)   Start     Dose/Rate Route Frequency Ordered Stop   06/22/20 1500  ceFAZolin (ANCEF) IVPB 2g/100 mL premix        2 g 200 mL/hr over 30 Minutes Intravenous Every 6 hours 06/22/20 1223 06/22/20 2118   06/22/20 0645  ceFAZolin (ANCEF) IVPB 2g/100 mL premix        2 g 200 mL/hr over 30 Minutes Intravenous On call to O.R. 06/22/20 0630 06/22/20 0920     and started on DVT prophylaxis in the form of Aspirin.   PT and OT were ordered for total joint protocol. Discharge planning consulted to help with postop disposition and equipment needs.  Patient had a good night on the evening of surgery. They started to get up OOB  with therapy on POD #0. Pt was seen during  rounds and was ready to go home pending progress with therapy. He worked with therapy on POD #1 and was meeting his goals. Pt was discharged to home later that day in stable condition.  Diet: Regular diet Activity: WBAT Follow-up: in 2 weeks Disposition: Home Discharged Condition: good   Discharge Instructions    Call MD / Call 911   Complete by: As directed    If you experience chest pain or shortness of breath, CALL 911 and be transported to the hospital emergency room.  If you develope a fever above 101 F, pus (white drainage) or increased drainage or redness at the wound, or calf pain, call your surgeon's office.   Change dressing   Complete by: As directed    Maintain surgical dressing until follow up in the clinic. If the edges start to pull up, may reinforce with tape. If the dressing is no longer working, may remove and cover with gauze and tape, but must keep the area dry and clean.  Call with any questions or concerns.   Constipation Prevention   Complete by: As directed    Drink plenty of fluids.  Prune juice may be helpful.  You may use a stool softener, such as Colace (over the counter) 100 mg twice a day.  Use MiraLax (over the counter) for constipation as needed.   Diet - low sodium heart healthy   Complete by: As directed    Discharge instructions   Complete by: As directed    Maintain surgical dressing until follow up in the clinic. If the edges start to pull up, may reinforce with tape. If the dressing is no longer working, may remove and cover with gauze and tape, but must keep the area dry and clean.  Follow up in 2 weeks at [facility]. Call with any questions or concerns.   Follow the hip precautions as taught in Physical Therapy   Complete by: As directed    Increase activity slowly as tolerated   Complete by: As directed    Weight bearing as tolerated with assist device (walker, cane, etc) as directed, use it as long as  suggested by your surgeon or therapist, typically at least 4-6 weeks.   TED hose   Complete by: As directed    Use stockings (TED hose) for 2 weeks on both leg(s).  You may remove them at night for sleeping.     Allergies as of 06/23/2020   No Known Allergies     Medication List    STOP taking these medications   acetaminophen 500 MG tablet Commonly known as: TYLENOL     TAKE these medications   anastrozole 1 MG tablet Commonly known as: ARIMIDEX TAKE 1/2 TABLET ONCE A WEEK What changed:   how much to take  how to take this  when to take this  additional instructions   aspirin 81 MG chewable tablet Chew 1 tablet (81 mg total) by mouth 2 (two) times daily for 28 days.   docusate sodium 100 MG capsule Commonly known as: COLACE Take 1 capsule (100 mg total) by mouth 2 (two) times daily.   ferrous sulfate 325 (65 FE) MG tablet Take 1 tablet (325 mg total) by mouth 3 (three) times daily after meals for 14 days.   HYDROcodone-acetaminophen 5-325 MG tablet Commonly known as: NORCO/VICODIN Take 1-2 tablets by mouth every 4 (four) hours as needed for moderate pain (pain score 4-6).   methocarbamol 500 MG tablet Commonly known as: ROBAXIN Take 1  tablet (500 mg total) by mouth every 6 (six) hours as needed for muscle spasms.   OVER THE COUNTER MEDICATION Anastrozole, turmeric, glucosamine chondroitin   oxymetazoline 0.05 % nasal spray Commonly known as: AFRIN Place 1 spray into both nostrils at bedtime.   polyethylene glycol 17 g packet Commonly known as: MIRALAX / GLYCOLAX Take 17 g by mouth 2 (two) times daily.   rosuvastatin 10 MG tablet Commonly known as: CRESTOR Take 1 tablet (10 mg total) by mouth daily. What changed: when to take this   testosterone cypionate 200 MG/ML injection Commonly known as: DEPOTESTOSTERONE CYPIONATE Inject 0.5 mLs (100 mg total) into the muscle once a week.   VITAMIN D PO Take 2,000 Units by mouth 2 (two) times daily.     Vitamin D3 75 MCG (3000 UT) Tabs Take 3,000 Units by mouth daily.            Discharge Care Instructions  (From admission, onward)         Start     Ordered   06/23/20 0000  Change dressing       Comments: Maintain surgical dressing until follow up in the clinic. If the edges start to pull up, may reinforce with tape. If the dressing is no longer working, may remove and cover with gauze and tape, but must keep the area dry and clean.  Call with any questions or concerns.   06/23/20 0901          Follow-up Information    Paralee Cancel, MD. Schedule an appointment as soon as possible for a visit in 2 weeks.   Specialty: Orthopedic Surgery Contact information: [address] [place] [address] [phone]               Signed: Griffith Citron, PA-C Orthopedic Surgery 06/28/2020, 9:18 AM

## 2020-08-09 DIAGNOSIS — Z96642 Presence of left artificial hip joint: Secondary | ICD-10-CM | POA: Diagnosis not present

## 2020-08-09 DIAGNOSIS — Z471 Aftercare following joint replacement surgery: Secondary | ICD-10-CM | POA: Diagnosis not present

## 2020-08-30 ENCOUNTER — Other Ambulatory Visit: Payer: Self-pay | Admitting: Family Medicine

## 2020-08-30 ENCOUNTER — Other Ambulatory Visit: Payer: Medicare Other

## 2020-08-30 ENCOUNTER — Other Ambulatory Visit: Payer: Self-pay

## 2020-08-30 DIAGNOSIS — E782 Mixed hyperlipidemia: Secondary | ICD-10-CM

## 2020-08-30 DIAGNOSIS — N400 Enlarged prostate without lower urinary tract symptoms: Secondary | ICD-10-CM

## 2020-08-31 ENCOUNTER — Other Ambulatory Visit: Payer: Self-pay | Admitting: Family

## 2020-08-31 LAB — CMP14+EGFR
ALT: 32 IU/L (ref 0–44)
AST: 40 IU/L (ref 0–40)
Albumin/Globulin Ratio: 2 (ref 1.2–2.2)
Albumin: 4.6 g/dL (ref 3.8–4.8)
Alkaline Phosphatase: 68 IU/L (ref 44–121)
BUN/Creatinine Ratio: 9 — ABNORMAL LOW (ref 10–24)
BUN: 10 mg/dL (ref 8–27)
Bilirubin Total: 1 mg/dL (ref 0.0–1.2)
CO2: 24 mmol/L (ref 20–29)
Calcium: 9.6 mg/dL (ref 8.6–10.2)
Chloride: 103 mmol/L (ref 96–106)
Creatinine, Ser: 1.14 mg/dL (ref 0.76–1.27)
GFR calc Af Amer: 75 mL/min/{1.73_m2} (ref >59)
GFR calc non Af Amer: 65 mL/min/{1.73_m2} (ref >59)
Globulin, Total: 2.3 g/dL (ref 1.5–4.5)
Glucose: 92 mg/dL (ref 65–99)
Potassium: 4.4 mmol/L (ref 3.5–5.2)
Sodium: 140 mmol/L (ref 134–144)
Total Protein: 6.9 g/dL (ref 6.0–8.5)

## 2020-08-31 LAB — LIPID PANEL
Chol/HDL Ratio: 3.2 ratio (ref 0.0–5.0)
Cholesterol, Total: 150 mg/dL (ref 100–199)
HDL: 47 mg/dL (ref >39)
LDL Chol Calc (NIH): 86 mg/dL (ref 0–99)
Triglycerides: 92 mg/dL (ref 0–149)
VLDL Cholesterol Cal: 17 mg/dL (ref 5–40)

## 2020-08-31 LAB — PSA, TOTAL AND FREE
PSA, Free Pct: 22.7 %
PSA, Free: 0.25 ng/mL
Prostate Specific Ag, Serum: 1.1 ng/mL (ref 0.0–4.0)

## 2020-09-01 ENCOUNTER — Other Ambulatory Visit: Payer: Self-pay

## 2020-09-01 ENCOUNTER — Encounter: Payer: Self-pay | Admitting: Family

## 2020-09-01 ENCOUNTER — Ambulatory Visit (INDEPENDENT_AMBULATORY_CARE_PROVIDER_SITE_OTHER): Payer: Medicare Other | Admitting: Family

## 2020-09-01 VITALS — BP 124/73 | HR 59 | Temp 97.9°F | Ht 72.0 in | Wt 197.2 lb

## 2020-09-01 DIAGNOSIS — E559 Vitamin D deficiency, unspecified: Secondary | ICD-10-CM

## 2020-09-01 DIAGNOSIS — E782 Mixed hyperlipidemia: Secondary | ICD-10-CM

## 2020-09-01 DIAGNOSIS — Z96649 Presence of unspecified artificial hip joint: Secondary | ICD-10-CM

## 2020-09-01 DIAGNOSIS — Z0000 Encounter for general adult medical examination without abnormal findings: Secondary | ICD-10-CM | POA: Diagnosis not present

## 2020-09-01 DIAGNOSIS — E349 Endocrine disorder, unspecified: Secondary | ICD-10-CM | POA: Diagnosis not present

## 2020-09-01 DIAGNOSIS — Z1159 Encounter for screening for other viral diseases: Secondary | ICD-10-CM | POA: Diagnosis not present

## 2020-09-01 LAB — CBC WITH DIFFERENTIAL/PLATELET
Basophils Absolute: 0 10*3/uL (ref 0.0–0.2)
Basos: 1 %
EOS (ABSOLUTE): 0.1 10*3/uL (ref 0.0–0.4)
Eos: 3 %
Hematocrit: 47.3 % (ref 37.5–51.0)
Hemoglobin: 15.9 g/dL (ref 13.0–17.7)
Immature Grans (Abs): 0 10*3/uL (ref 0.0–0.1)
Immature Granulocytes: 0 %
Lymphocytes Absolute: 1.2 10*3/uL (ref 0.7–3.1)
Lymphs: 35 %
MCH: 32.4 pg (ref 26.6–33.0)
MCHC: 33.6 g/dL (ref 31.5–35.7)
MCV: 96 fL (ref 79–97)
Monocytes Absolute: 0.4 10*3/uL (ref 0.1–0.9)
Monocytes: 10 %
Neutrophils Absolute: 1.8 10*3/uL (ref 1.4–7.0)
Neutrophils: 51 %
Platelets: 176 10*3/uL (ref 150–450)
RBC: 4.91 x10E6/uL (ref 4.14–5.80)
RDW: 12.1 % (ref 11.6–15.4)
WBC: 3.5 10*3/uL (ref 3.4–10.8)

## 2020-09-01 NOTE — Patient Instructions (Signed)
Health Maintenance After Age 70 After age 70, you are at a higher risk for certain long-term diseases and infections as well as injuries from falls. Falls are a major cause of broken bones and head injuries in people who are older than age 70. Getting regular preventive care can help to keep you healthy and well. Preventive care includes getting regular testing and making lifestyle changes as recommended by your health care provider. Talk with your health care provider about:  Which screenings and tests you should have. A screening is a test that checks for a disease when you have no symptoms.  A diet and exercise plan that is right for you. What should I know about screenings and tests to prevent falls? Screening and testing are the best ways to find a health problem early. Early diagnosis and treatment give you the best chance of managing medical conditions that are common after age 70. Certain conditions and lifestyle choices may make you more likely to have a fall. Your health care provider may recommend:  Regular vision checks. Poor vision and conditions such as cataracts can make you more likely to have a fall. If you wear glasses, make sure to get your prescription updated if your vision changes.  Medicine review. Work with your health care provider to regularly review all of the medicines you are taking, including over-the-counter medicines. Ask your health care provider about any side effects that may make you more likely to have a fall. Tell your health care provider if any medicines that you take make you feel dizzy or sleepy.  Osteoporosis screening. Osteoporosis is a condition that causes the bones to get weaker. This can make the bones weak and cause them to break more easily.  Blood pressure screening. Blood pressure changes and medicines to control blood pressure can make you feel dizzy.  Strength and balance checks. Your health care provider may recommend certain tests to check your  strength and balance while standing, walking, or changing positions.  Foot health exam. Foot pain and numbness, as well as not wearing proper footwear, can make you more likely to have a fall.  Depression screening. You may be more likely to have a fall if you have a fear of falling, feel emotionally low, or feel unable to do activities that you used to do.  Alcohol use screening. Using too much alcohol can affect your balance and may make you more likely to have a fall. What actions can I take to lower my risk of falls? General instructions  Talk with your health care provider about your risks for falling. Tell your health care provider if: ? You fall. Be sure to tell your health care provider about all falls, even ones that seem minor. ? You feel dizzy, sleepy, or off-balance.  Take over-the-counter and prescription medicines only as told by your health care provider. These include any supplements.  Eat a healthy diet and maintain a healthy weight. A healthy diet includes low-fat dairy products, low-fat (lean) meats, and fiber from whole grains, beans, and lots of fruits and vegetables. Home safety  Remove any tripping hazards, such as rugs, cords, and clutter.  Install safety equipment such as grab bars in bathrooms and safety rails on stairs.  Keep rooms and walkways well-lit. Activity  Follow a regular exercise program to stay fit. This will help you maintain your balance. Ask your health care provider what types of exercise are appropriate for you.  If you need a cane or walker,   use it as recommended by your health care provider.  Wear supportive shoes that have nonskid soles.   Lifestyle  Do not drink alcohol if your health care provider tells you not to drink.  If you drink alcohol, limit how much you have: ? 0-1 drink a day for women. ? 0-2 drinks a day for men.  Be aware of how much alcohol is in your drink. In the [place] one drink equals one typical bottle of beer (12  oz), one-half glass of wine (5 oz), or one shot of hard liquor (1 oz).  Do not use any products that contain nicotine or tobacco, such as cigarettes and e-cigarettes. If you need help quitting, ask your health care provider. Summary  Having a healthy lifestyle and getting preventive care can help to protect your health and wellness after age 70.  Screening and testing are the best way to find a health problem early and help you avoid having a fall. Early diagnosis and treatment give you the best chance for managing medical conditions that are more common for people who are older than age 70.  Falls are a major cause of broken bones and head injuries in people who are older than age 70. Take precautions to prevent a fall at home.  Work with your health care provider to learn what changes you can make to improve your health and wellness and to prevent falls. This information is not intended to replace advice given to you by your health care provider. Make sure you discuss any questions you have with your health care provider. Document Revised: 11/12/2018 Document Reviewed: 06/04/2017 Elsevier Patient Education  2021 [employer]  

## 2020-09-01 NOTE — Progress Notes (Signed)
Subjective:    Patient ID: [name], male    DOB: [birth year], 70 y.o.   MRN: [id number]  Chief Complaint  Patient presents with  . Medical Management of Chronic Issues   PT presents to the office today for chronic follow up. 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 States he is doing well.   Using testosterone injection 100 mg weekly. Stable.  Hyperlipidemia This is a chronic problem. The current episode started more than 1 year ago. The problem is controlled. Recent lipid tests were reviewed and are normal. Exacerbating diseases include obesity. Current antihyperlipidemic treatment includes statins. The current treatment provides moderate improvement of lipids. Risk factors for coronary artery disease include dyslipidemia, male sex, hypertension and a sedentary lifestyle.      Review of Systems  All other systems reviewed and are negative.  Family History  Problem Relation Age of Onset  . Kidney cancer Mother 47  . Renal cancer Mother   . Alzheimer's disease Father 70  . Bipolar disorder Son   . Stroke Paternal Uncle   . Heart disease Paternal Grandmother   . Colon cancer Neg Hx   . Esophageal cancer Neg Hx   . Rectal cancer Neg Hx   . Stomach cancer Neg Hx   . Liver cancer Neg Hx   . Pancreatic cancer Neg Hx   . Prostate cancer Neg Hx    Social History   Socioeconomic History  . Marital status: Married    Spouse name: Not on file  . Number of children: 2  . Years of education: 16  . Highest education level: Bachelor's degree (e.g., BA, AB, BS)  Occupational History  . 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 Smoking status: Former Smoker    Packs/day: 1.00    Types: Cigarettes    Start date: 04/02/1983    Quit date: 04/01/1988    Years since quitting: 32.4  . Smokeless tobacco: Never Used  Vaping Use  . Vaping Use: Never used  Substance and Sexual Activity  . Alcohol use: Yes    Comment: glass of wine or beer daily    . Drug use: Never  . Sexual activity: Yes  Other Topics Concern  . Not on file  Social History Narrative   Daily caffeine    Social Determinants of Health   Financial Resource Strain: Not on file  Food Insecurity: Not on file  Transportation Needs: Not on file  Physical Activity: Not on file  Stress: Not on file  Social Connections: Not on file       Objective:   Physical Exam Vitals reviewed.  Constitutional:      General: He is not in acute distress.    Appearance: He is well-developed and well-nourished.  HENT:     Head: Normocephalic.     Right Ear: Tympanic membrane normal.     Left Ear: Tympanic membrane normal.     Mouth/Throat:     Mouth: Oropharynx is clear and moist.  Eyes:     General:        Right eye: No discharge.        Left eye: No discharge.     Pupils: Pupils are equal, round, and reactive to light.  Neck:     Thyroid: No thyromegaly.  Cardiovascular:     Rate and Rhythm: Normal rate and regular rhythm.     Pulses: Intact distal pulses.  Heart sounds: Normal heart sounds. No murmur heard.   Pulmonary:     Effort: Pulmonary effort is normal. No respiratory distress.     Breath sounds: Normal breath sounds. No wheezing.  Abdominal:     General: Bowel sounds are normal. There is no distension.     Palpations: Abdomen is soft.     Tenderness: There is no abdominal tenderness.  Musculoskeletal:        General: No tenderness or edema. Normal range of motion.     Cervical back: Normal range of motion and neck supple.  Skin:    General: Skin is warm and dry.     Findings: No erythema or rash.  Neurological:     Mental Status: He is alert and oriented to person, place, and time.     Cranial Nerves: No cranial nerve deficit.     Deep Tendon Reflexes: Reflexes are normal and symmetric.  Psychiatric:        Mood and Affect: Mood and affect normal.        Behavior: Behavior normal.        Thought Content: Thought content normal.         Judgment: Judgment normal.       BP (!) 145/93   Pulse (!) 59   Temp 97.9 F (36.6 C) (Temporal)   Ht 6' (1.829 m)   Wt 197 lb 3.2 oz (89.4 kg)   BMI 26.75 kg/m      Assessment & Plan:  TAVIS KRING comes in today with chief complaint of Medical Management of Chronic Issues   Diagnosis and orders addressed:  1. Mixed hyperlipidemia - CBC with Differential/Platelet  2. S/P revision of left total hip - CBC with Differential/Platelet  3. Vitamin D deficiency - VITAMIN D 25 Hydroxy (Vit-D Deficiency, Fractures) - CBC with Differential/Platelet   5. Testosterone deficiency - Testosterone,Free and Total - CBC with Differential/Platelet  6. Need for hepatitis C screening test - Hepatitis C antibody - CBC with Differential/Platelet   Labs pending Health Maintenance reviewed Diet and exercise encouraged  Follow up plan: 6 months    Evelina Dun, FNP

## 2020-09-02 LAB — TESTOSTERONE,FREE AND TOTAL
Testosterone, Free: 11.9 pg/mL (ref 6.6–18.1)
Testosterone: 594 ng/dL (ref 264–916)

## 2020-09-02 LAB — VITAMIN D 25 HYDROXY (VIT D DEFICIENCY, FRACTURES): Vit D, 25-Hydroxy: 33.7 ng/mL (ref 30.0–100.0)

## 2020-09-02 LAB — HEPATITIS C ANTIBODY: Hep C Virus Ab: <0.1 s/co ratio (ref 0.0–0.9)

## 2020-09-11 ENCOUNTER — Other Ambulatory Visit: Payer: Self-pay | Admitting: *Deleted

## 2020-09-11 DIAGNOSIS — E349 Endocrine disorder, unspecified: Secondary | ICD-10-CM

## 2020-09-11 MED ORDER — ANASTROZOLE 1 MG PO TABS: ORAL_TABLET | ORAL | 7 refills | Status: AC

## 2020-09-20 DIAGNOSIS — Z96642 Presence of left artificial hip joint: Secondary | ICD-10-CM | POA: Diagnosis not present

## 2020-09-20 DIAGNOSIS — Z471 Aftercare following joint replacement surgery: Secondary | ICD-10-CM | POA: Diagnosis not present

## 2020-09-24 ENCOUNTER — Other Ambulatory Visit: Payer: Self-pay | Admitting: Family

## 2020-09-24 DIAGNOSIS — E349 Endocrine disorder, unspecified: Secondary | ICD-10-CM

## 2020-09-26 ENCOUNTER — Other Ambulatory Visit: Payer: Self-pay | Admitting: *Deleted

## 2020-09-26 DIAGNOSIS — E782 Mixed hyperlipidemia: Secondary | ICD-10-CM

## 2020-09-26 MED ORDER — ROSUVASTATIN CALCIUM 10 MG PO TABS: 10.0000 mg | ORAL_TABLET | Freq: Every day | ORAL | 1 refills | Status: DC

## 2021-01-08 DIAGNOSIS — L570 Actinic keratosis: Secondary | ICD-10-CM | POA: Diagnosis not present

## 2021-01-08 DIAGNOSIS — L905 Scar conditions and fibrosis of skin: Secondary | ICD-10-CM | POA: Diagnosis not present

## 2021-01-08 DIAGNOSIS — L821 Other seborrheic keratosis: Secondary | ICD-10-CM | POA: Diagnosis not present

## 2021-01-08 DIAGNOSIS — D485 Neoplasm of uncertain behavior of skin: Secondary | ICD-10-CM | POA: Diagnosis not present

## 2021-01-08 DIAGNOSIS — L814 Other melanin hyperpigmentation: Secondary | ICD-10-CM | POA: Diagnosis not present

## 2021-01-08 DIAGNOSIS — D225 Melanocytic nevi of trunk: Secondary | ICD-10-CM | POA: Diagnosis not present

## 2021-01-08 DIAGNOSIS — Z85828 Personal history of other malignant neoplasm of skin: Secondary | ICD-10-CM | POA: Diagnosis not present

## 2021-02-27 ENCOUNTER — Other Ambulatory Visit: Payer: Self-pay

## 2021-02-27 ENCOUNTER — Other Ambulatory Visit: Payer: Medicare Other

## 2021-02-27 ENCOUNTER — Other Ambulatory Visit: Payer: Self-pay | Admitting: Family Medicine

## 2021-02-27 DIAGNOSIS — N400 Enlarged prostate without lower urinary tract symptoms: Secondary | ICD-10-CM | POA: Diagnosis not present

## 2021-02-27 DIAGNOSIS — E349 Endocrine disorder, unspecified: Secondary | ICD-10-CM | POA: Diagnosis not present

## 2021-02-27 DIAGNOSIS — E782 Mixed hyperlipidemia: Secondary | ICD-10-CM

## 2021-02-27 LAB — CMP14+EGFR
ALT: 25 IU/L (ref 0–44)
AST: 40 IU/L (ref 0–40)
Albumin/Globulin Ratio: 2.2 (ref 1.2–2.2)
Albumin: 4.7 g/dL (ref 3.8–4.8)
Alkaline Phosphatase: 63 IU/L (ref 44–121)
BUN/Creatinine Ratio: 12 (ref 10–24)
BUN: 14 mg/dL (ref 8–27)
Bilirubin Total: 1.3 mg/dL — ABNORMAL HIGH (ref 0.0–1.2)
CO2: 22 mmol/L (ref 20–29)
Calcium: 9.5 mg/dL (ref 8.6–10.2)
Chloride: 104 mmol/L (ref 96–106)
Creatinine, Ser: 1.13 mg/dL (ref 0.76–1.27)
Globulin, Total: 2.1 g/dL (ref 1.5–4.5)
Glucose: 105 mg/dL — ABNORMAL HIGH (ref 65–99)
Potassium: 4.7 mmol/L (ref 3.5–5.2)
Sodium: 139 mmol/L (ref 134–144)
Total Protein: 6.8 g/dL (ref 6.0–8.5)
eGFR: 70 mL/min/{1.73_m2} (ref >59)

## 2021-02-27 LAB — CBC WITH DIFFERENTIAL/PLATELET
Basophils Absolute: 0 10*3/uL (ref 0.0–0.2)
Basos: 1 %
EOS (ABSOLUTE): 0.1 10*3/uL (ref 0.0–0.4)
Eos: 2 %
Hematocrit: 45.6 % (ref 37.5–51.0)
Hemoglobin: 15.7 g/dL (ref 13.0–17.7)
Immature Grans (Abs): 0 10*3/uL (ref 0.0–0.1)
Immature Granulocytes: 0 %
Lymphocytes Absolute: 1.2 10*3/uL (ref 0.7–3.1)
Lymphs: 38 %
MCH: 32.4 pg (ref 26.6–33.0)
MCHC: 34.4 g/dL (ref 31.5–35.7)
MCV: 94 fL (ref 79–97)
Monocytes Absolute: 0.3 10*3/uL (ref 0.1–0.9)
Monocytes: 9 %
Neutrophils Absolute: 1.6 10*3/uL (ref 1.4–7.0)
Neutrophils: 50 %
Platelets: 158 10*3/uL (ref 150–450)
RBC: 4.84 x10E6/uL (ref 4.14–5.80)
RDW: 11.9 % (ref 11.6–15.4)
WBC: 3.2 10*3/uL — ABNORMAL LOW (ref 3.4–10.8)

## 2021-02-27 LAB — LIPID PANEL
Chol/HDL Ratio: 3 ratio (ref 0.0–5.0)
Cholesterol, Total: 143 mg/dL (ref 100–199)
HDL: 48 mg/dL (ref >39)
LDL Chol Calc (NIH): 77 mg/dL (ref 0–99)
Triglycerides: 95 mg/dL (ref 0–149)
VLDL Cholesterol Cal: 18 mg/dL (ref 5–40)

## 2021-03-01 ENCOUNTER — Ambulatory Visit (INDEPENDENT_AMBULATORY_CARE_PROVIDER_SITE_OTHER): Payer: Medicare Other | Admitting: Family

## 2021-03-01 ENCOUNTER — Other Ambulatory Visit: Payer: Self-pay

## 2021-03-01 ENCOUNTER — Encounter: Payer: Self-pay | Admitting: Family

## 2021-03-01 VITALS — BP 124/74 | HR 67 | Temp 97.5°F | Ht 72.0 in | Wt 184.8 lb

## 2021-03-01 DIAGNOSIS — E349 Endocrine disorder, unspecified: Secondary | ICD-10-CM

## 2021-03-01 DIAGNOSIS — E559 Vitamin D deficiency, unspecified: Secondary | ICD-10-CM

## 2021-03-01 DIAGNOSIS — E782 Mixed hyperlipidemia: Secondary | ICD-10-CM

## 2021-03-01 LAB — TESTOSTERONE,FREE AND TOTAL
Testosterone, Free: 18.9 pg/mL — ABNORMAL HIGH (ref 6.6–18.1)
Testosterone: 929 ng/dL — ABNORMAL HIGH (ref 264–916)

## 2021-03-01 LAB — PSA, TOTAL AND FREE
PSA, Free Pct: 18.4 %
PSA, Free: 0.35 ng/mL
Prostate Specific Ag, Serum: 1.9 ng/mL (ref 0.0–4.0)

## 2021-03-01 MED ORDER — TESTOSTERONE CYPIONATE 200 MG/ML IM SOLN: 100.0000 mg | INTRAMUSCULAR | 2 refills | Status: DC

## 2021-03-01 NOTE — Patient Instructions (Signed)

## 2021-03-01 NOTE — Progress Notes (Signed)
   Subjective:    Patient ID: [name], male    DOB: [birth year], 70 y.o.   MRN: [id number]  Chief Complaint  Patient presents with   Medical Management of Chronic Issues   PT presents to the office today for chronic follow up. 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 States he is doing well.   Using testosterone injection 100 mg weekly. Stable.  Hyperlipidemia This is a chronic problem. The current episode started more than 1 year ago. Current antihyperlipidemic treatment includes statins. The current treatment provides moderate improvement of lipids. Risk factors for coronary artery disease include dyslipidemia, male sex and a sedentary lifestyle.     Review of Systems  All other systems reviewed and are negative.     Objective:   Physical Exam Vitals reviewed.  Constitutional:      General: He is not in acute distress.    Appearance: He is well-developed.  HENT:     Head: Normocephalic.     Right Ear: Tympanic membrane normal.     Left Ear: Tympanic membrane normal.  Eyes:     General:        Right eye: No discharge.        Left eye: No discharge.     Pupils: Pupils are equal, round, and reactive to light.  Neck:     Thyroid: No thyromegaly.  Cardiovascular:     Rate and Rhythm: Normal rate and regular rhythm.     Heart sounds: Normal heart sounds. No murmur heard. Pulmonary:     Effort: Pulmonary effort is normal. No respiratory distress.     Breath sounds: Normal breath sounds. No wheezing.  Abdominal:     General: Bowel sounds are normal. There is no distension.     Palpations: Abdomen is soft.     Tenderness: There is no abdominal tenderness.  Musculoskeletal:        General: No tenderness. Normal range of motion.     Cervical back: Normal range of motion and neck supple.  Skin:    General: Skin is warm and dry.     Findings: No erythema or rash.  Neurological:     Mental Status: He is alert and oriented to person, place, and  time.     Cranial Nerves: No cranial nerve deficit.     Deep Tendon Reflexes: Reflexes are normal and symmetric.  Psychiatric:        Behavior: Behavior normal.        Thought Content: Thought content normal.        Judgment: Judgment normal.    BP 124/74   Pulse 67   Temp (!) 97.5 F (36.4 C) (Temporal)   Ht 6' (1.829 m)   Wt 184 lb 12.8 oz (83.8 kg)   SpO2 95%   BMI 25.06 kg/m      Assessment & Plan:  GORDY GNAGEY comes in today with chief complaint of Medical Management of Chronic Issues   Diagnosis and orders addressed:  1. Mixed hyperlipidemia  2. Testosterone deficiency  3. Vitamin D deficiency   Labs reviewed  Health Maintenance reviewed Diet and exercise encouraged  Follow up plan: 6 months    Evelina Dun, FNP

## 2021-03-02 ENCOUNTER — Telehealth: Payer: Self-pay | Admitting: Family

## 2021-04-01 ENCOUNTER — Other Ambulatory Visit: Payer: Self-pay | Admitting: Family

## 2021-04-01 DIAGNOSIS — E782 Mixed hyperlipidemia: Secondary | ICD-10-CM

## 2021-04-06 ENCOUNTER — Ambulatory Visit (INDEPENDENT_AMBULATORY_CARE_PROVIDER_SITE_OTHER): Payer: Medicare Other

## 2021-04-06 VITALS — Ht 72.0 in | Wt 180.0 lb

## 2021-04-06 DIAGNOSIS — Z0000 Encounter for general adult medical examination without abnormal findings: Secondary | ICD-10-CM | POA: Diagnosis not present

## 2021-04-06 NOTE — Patient Instructions (Signed)
[name] , Thank you for taking time to come for your Medicare Wellness Visit. I appreciate your ongoing commitment to your health goals. Please review the following plan we discussed and let me know if I can assist you in the future.   Screening recommendations/referrals: Colonoscopy: Done 11/05/2017 - Repeat in 10 years  Recommended yearly ophthalmology/optometry visit for glaucoma screening and checkup Recommended yearly dental visit for hygiene and checkup  Vaccinations: Influenza vaccine: Done 09/02/2019 - Repeat annually  Pneumococcal vaccine: Done 12/19/2016 &07/19/2018 Tdap vaccine: Done 10/21/2016 - Repeat in 10 years  Shingles vaccine: Zostavax 2013 - due for Shingrix discussed. Please contact your pharmacy for coverage information.     Covid-19:  one dose done 11/2019 - declined further vaccine  Advanced directives: Please bring a copy of your health care power of attorney and living will to the office to be added to your chart at your convenience.   Conditions/risks identified: Keep up the great work!   Next appointment: Follow up in one year for your annual wellness visit.   Preventive Care 70 Years and Older, Male  Preventive care refers to lifestyle choices and visits with your health care provider that can promote health and wellness. What does preventive care include? A yearly physical exam. This is also called an annual well check. Dental exams once or twice a year. Routine eye exams. Ask your health care provider how often you should have your eyes checked. Personal lifestyle choices, including: Daily care of your teeth and gums. Regular physical activity. Eating a healthy diet. Avoiding tobacco and drug use. Limiting alcohol use. Practicing safe sex. Taking low doses of aspirin every day. Taking vitamin and mineral supplements as recommended by your health care provider. What happens during an annual well check? The services and screenings done by your health care  provider during your annual well check will depend on your age, overall health, lifestyle risk factors, and family history of disease. Counseling  Your health care provider may ask you questions about your: Alcohol use. Tobacco use. Drug use. Emotional well-being. Home and relationship well-being. Sexual activity. Eating habits. History of falls. Memory and ability to understand (cognition). Work and work Statistician. Screening  You may have the following tests or measurements: Height, weight, and BMI. Blood pressure. Lipid and cholesterol levels. These may be checked every 5 years, or more frequently if you are over 10 years old. Skin check. Lung cancer screening. You may have this screening every year starting at age 27 if you have a 30-pack-year history of smoking and currently smoke or have quit within the past 15 years. Fecal occult blood test (FOBT) of the stool. You may have this test every year starting at age 45. Flexible sigmoidoscopy or colonoscopy. You may have a sigmoidoscopy every 5 years or a colonoscopy every 10 years starting at age 28. Prostate cancer screening. Recommendations will vary depending on your family history and other risks. Hepatitis C blood test. Hepatitis B blood test. Sexually transmitted disease (STD) testing. Diabetes screening. This is done by checking your blood sugar (glucose) after you have not eaten for a while (fasting). You may have this done every 1-3 years. Abdominal aortic aneurysm (AAA) screening. You may need this if you are a current or former smoker. Osteoporosis. You may be screened starting at age 20 if you are at high risk. Talk with your health care provider about your test results, treatment options, and if necessary, the need for more tests. Vaccines  Your health  care provider may recommend certain vaccines, such as: Influenza vaccine. This is recommended every year. Tetanus, diphtheria, and acellular pertussis (Tdap, Td)  vaccine. You may need a Td booster every 10 years. Zoster vaccine. You may need this after age 47. Pneumococcal 13-valent conjugate (PCV13) vaccine. One dose is recommended after age 34. Pneumococcal polysaccharide (PPSV23) vaccine. One dose is recommended after age 45. Talk to your health care provider about which screenings and vaccines you need and how often you need them. This information is not intended to replace advice given to you by your health care provider. Make sure you discuss any questions you have with your health care provider. Document Released: 08/18/2015 Document Revised: 04/10/2016 Document Reviewed: 05/23/2015 Elsevier Interactive Patient Education  2017 [place] Prevention in the Home Falls can cause injuries. They can happen to people of all ages. There are many things you can do to make your home safe and to help prevent falls. What can I do on the outside of my home? Regularly fix the edges of walkways and driveways and fix any cracks. Remove anything that might make you trip as you walk through a door, such as a raised step or threshold. Trim any bushes or trees on the path to your home. Use bright outdoor lighting. Clear any walking paths of anything that might make someone trip, such as rocks or tools. Regularly check to see if handrails are loose or broken. Make sure that both sides of any steps have handrails. Any raised decks and porches should have guardrails on the edges. Have any leaves, snow, or ice cleared regularly. Use sand or salt on walking paths during winter. Clean up any spills in your garage right away. This includes oil or grease spills. What can I do in the bathroom? Use night lights. Install grab bars by the toilet and in the tub and shower. Do not use towel bars as grab bars. Use non-skid mats or decals in the tub or shower. If you need to sit down in the shower, use a plastic, non-slip stool. Keep the floor dry. Clean up any  water that spills on the floor as soon as it happens. Remove soap buildup in the tub or shower regularly. Attach bath mats securely with double-sided non-slip rug tape. Do not have throw rugs and other things on the floor that can make you trip. What can I do in the bedroom? Use night lights. Make sure that you have a light by your bed that is easy to reach. Do not use any sheets or blankets that are too big for your bed. They should not hang down onto the floor. Have a firm chair that has side arms. You can use this for support while you get dressed. Do not have throw rugs and other things on the floor that can make you trip. What can I do in the kitchen? Clean up any spills right away. Avoid walking on wet floors. Keep items that you use a lot in easy-to-reach places. If you need to reach something above you, use a strong step stool that has a grab bar. Keep electrical cords out of the way. Do not use floor polish or wax that makes floors slippery. If you must use wax, use non-skid floor wax. Do not have throw rugs and other things on the floor that can make you trip. What can I do with my stairs? Do not leave any items on the stairs. Make sure that there are handrails on  both sides of the stairs and use them. Fix handrails that are broken or loose. Make sure that handrails are as long as the stairways. Check any carpeting to make sure that it is firmly attached to the stairs. Fix any carpet that is loose or worn. Avoid having throw rugs at the top or bottom of the stairs. If you do have throw rugs, attach them to the floor with carpet tape. Make sure that you have a light switch at the top of the stairs and the bottom of the stairs. If you do not have them, ask someone to add them for you. What else can I do to help prevent falls? Wear shoes that: Do not have high heels. Have rubber bottoms. Are comfortable and fit you well. Are closed at the toe. Do not wear sandals. If you use a  stepladder: Make sure that it is fully opened. Do not climb a closed stepladder. Make sure that both sides of the stepladder are locked into place. Ask someone to hold it for you, if possible. Clearly mark and make sure that you can see: Any grab bars or handrails. First and last steps. Where the edge of each step is. Use tools that help you move around (mobility aids) if they are needed. These include: Canes. Walkers. Scooters. Crutches. Turn on the lights when you go into a dark area. Replace any light bulbs as soon as they burn out. Set up your furniture so you have a clear path. Avoid moving your furniture around. If any of your floors are uneven, fix them. If there are any pets around you, be aware of where they are. Review your medicines with your doctor. Some medicines can make you feel dizzy. This can increase your chance of falling. Ask your doctor what other things that you can do to help prevent falls. This information is not intended to replace advice given to you by your health care provider. Make sure you discuss any questions you have with your health care provider. Document Released: 05/18/2009 Document Revised: 12/28/2015 Document Reviewed: 08/26/2014 Elsevier Interactive Patient Education  2017 [employer].

## 2021-04-06 NOTE — Progress Notes (Signed)
Subjective:   [name] is a 70 y.o. male who presents for Medicare Annual/Subsequent preventive examination.  Virtual Visit via Telephone Note  I connected with  [name] on 04/06/21 at  8:15 AM EDT by telephone and verified that I am speaking with the correct person using two identifiers.  Location: Patient: Home Provider: [facility] Persons participating in the virtual visit: patient/Nurse Health Advisor   I discussed the limitations, risks, security and privacy concerns of performing an evaluation and management service by telephone and the availability of in person appointments. The patient expressed understanding and agreed to proceed.  Interactive audio and video telecommunications were attempted between this nurse and patient, however failed, due to patient having technical difficulties OR patient did not have access to video capability.  We continued and completed visit with audio only.  Some vital signs may be absent or patient reported.   [name] E Yoshi Vicencio, LPN   Review of Systems     Cardiac Risk Factors include: advanced age (>50mn, >>47women);male gender;dyslipidemia     Objective:    Today's Vitals   04/06/21 0821  Weight: 180 lb (81.6 kg)  Height: 6' (1.829 m)   Body mass index is 24.41 kg/m.  Advanced Directives 04/06/2021 06/22/2020 06/14/2020 02/18/2018 10/01/2011 09/26/2011  Does Patient Have a Medical Advance Directive? Yes No No Yes Patient does not have advance directive Patient does not have advance directive  Type of Advance Directive [place] - - Living will;Healthcare Power of Attorney - -  Does patient want to make changes to medical advance directive? - - - No - Patient declined - -  Copy of [place] Chart? No - copy requested - - No - copy requested - -  Would patient like information on creating a medical advance directive? - No - Patient declined - No - Patient declined - -  Pre-existing  out of facility DNR order (yellow form or pink MOST form) - - - - No No    Current Medications (verified) Outpatient Encounter Medications as of 04/06/2021  Medication Sig   anastrozole (ARIMIDEX) 1 MG tablet TAKE 1/2 TABLET ONCE A WEEK   Cholecalciferol (VITAMIN D3) 75 MCG (3000 UT) TABS Take 3,000 Units by mouth daily.   rosuvastatin (CRESTOR) 10 MG tablet TAKE 1 TABLET BY MOUTH EVERY DAY   testosterone cypionate (DEPOTESTOSTERONE CYPIONATE) 200 MG/ML injection Inject 0.5 mLs (100 mg total) into the muscle once a week.   No facility-administered encounter medications on file as of 04/06/2021.    Allergies (verified) Patient has no known allergies.   History: Past Medical History:  Diagnosis Date   Actinic keratosis    Arthritis    hip , shoulders    Cancer ([facility]    hx of skin cancer    Diverticulitis    GERD (gastroesophageal reflux disease)    Hyperlipidemia    Osteoarthritis    left Hip   Pneumonia    hx of years ago    Polyp, sigmoid colon    Testosterone deficiency 2012   Past Surgical History:  Procedure Laterality Date   COLONOSCOPY  10/10/2006   EYE SURGERY Bilateral 1990   SHOULDER ARTHROSCOPY  11/2017   TOTAL HIP ARTHROPLASTY  10/01/2011   Procedure: TOTAL HIP ARTHROPLASTY ANTERIOR APPROACH;  Surgeon: MMauri Pole MD;  Location: [facility];  Service: Orthopedics;  Laterality: Left;   TOTAL HIP REVISION Left 06/22/2020   Procedure: TOTAL HIP REVISION;  Surgeon: Paralee Cancel, MD;  Location: [facility];  Service: Orthopedics;  Laterality: Left;  2.5 hrs   Family History  Problem Relation Age of Onset   Kidney cancer Mother 20   Renal cancer Mother    Alzheimer's disease Father 79   Bipolar disorder Son    Stroke Paternal Uncle    Heart disease Paternal Grandmother    Colon cancer Neg Hx    Esophageal cancer Neg Hx    Rectal cancer Neg Hx    Stomach cancer Neg Hx    Liver cancer Neg Hx    Pancreatic cancer Neg Hx    Prostate cancer Neg Hx    Social History    Socioeconomic History   Marital status: Married    Spouse name: Not on file   Number of children: 2   Years of education: 16   Highest education level: Bachelor's degree (e.g., BA, AB, BS)  Occupational History   Occupation: CEO    Comment: retired   Tobacco Use   Smoking status: Former    Packs/day: 1.00    Types: Cigarettes    Start date: 04/02/1983    Quit date: 04/01/1988    Years since quitting: 33.0   Smokeless tobacco: Never  Vaping Use   Vaping Use: Never used  Substance and Sexual Activity   Alcohol use: Yes    Comment: glass of wine or beer daily    Drug use: Never   Sexual activity: Yes  Other Topics Concern   Not on file  Social History Narrative   Daily caffeine    Lives home with wife   Children live nearby   Social Determinants of Health   Financial Resource Strain: Low Risk    Difficulty of Paying Living Expenses: Not hard at all  Food Insecurity: No Food Insecurity   Worried About Charity fundraiser in the Last Year: Never true   [place] in the Last Year: Never true  Transportation Needs: No Transportation Needs   Lack of Transportation (Medical): No   Lack of Transportation (Non-Medical): No  Physical Activity: Sufficiently Active   Days of Exercise per Week: 5 days   Minutes of Exercise per Session: 90 min  Stress: No Stress Concern Present   Feeling of Stress : Not at all  Social Connections: Moderately Integrated   Frequency of Communication with Friends and Family: More than three times a week   Frequency of Social Gatherings with Friends and Family: More than three times a week   Attends Religious Services: More than 4 times per year   Active Member of [employer] or Organizations: No   Attends Music therapist: Never   Marital Status: Married    Tobacco Counseling Counseling given: Not Answered   Clinical Intake:  Pre-visit preparation completed: Yes  Pain : No/denies pain     BMI - recorded:  24.41 Nutritional Status: BMI of 19-24  Normal Nutritional Risks: None Diabetes: No  How often do you need to have someone help you when you read instructions, pamphlets, or other written materials from your doctor or pharmacy?: 1 - Never  Diabetic? No  Interpreter Needed?: No  Information entered by :: Hava Massingale, LPN   Activities of Daily Living In your present state of health, do you have any difficulty performing the following activities: 04/06/2021 06/22/2020  Hearing? N N  Vision? N N  Difficulty concentrating or making decisions? N N  Walking or climbing stairs? N N  Dressing or bathing? N N  Doing errands, shopping? N N  Preparing Food and eating ? N -  Using the Toilet? N -  In the past six months, have you accidently leaked urine? N -  Do you have problems with loss of bowel control? N -  Managing your Medications? N -  Managing your Finances? N -  Housekeeping or managing your Housekeeping? N -  Some recent data might be hidden    Patient Care Team: Sharion Balloon, FNP as PCP - General (Family Medicine) Druscilla Brownie, MD as Referring Physician (Dermatology) Ortho, [facility] (Specialist) Sharol Roussel, OD as Physician Assistant (Optometry)  Indicate any recent Medical Services you may have received from other than [facility] providers in the past year (date may be approximate).     Assessment:   This is a routine wellness examination for Cyprian.  Hearing/Vision screen Hearing Screening - Comments:: Denies hearing difficulties Vision Screening - Comments:: Wears contacts - up to date with annual eye exams with eye doctor in [place] issues and exercise activities discussed: Current Exercise Habits: Home exercise routine, Type of exercise: strength training/weights;walking, Time (Minutes): 60, Frequency (Times/Week): 7, Weekly Exercise (Minutes/Week): 420, Intensity: Intense, Exercise limited by: orthopedic condition(s)   Goals Addressed              This Visit's Progress    Exercise 150 min/wk Moderate Activity   On track    Patient Stated       He wants to start traveling - plans to go to [place] soon      Depression Screen PHQ 2/9 Scores 04/06/2021 03/01/2021 09/01/2020 06/16/2020 03/01/2020 09/02/2019 07/01/2018  PHQ - 2 Score 0 0 0 0 0 0 0  PHQ- 9 Score 0 0 - - - - -    Fall Risk Fall Risk  04/06/2021 03/01/2021 09/01/2020 06/16/2020 03/01/2020  Falls in the past year? 0 0 0 0 0  Number falls in past yr: 0 - - - -  Injury with Fall? 0 - - - -  Risk for fall due to : Orthopedic patient;Impaired vision No Fall Risks - - -  Follow up Falls prevention discussed Education provided - - -    FALL RISK PREVENTION PERTAINING TO THE HOME:  Any stairs in or around the home? Yes  If so, are there any without handrails? No  Home free of loose throw rugs in walkways, pet beds, electrical cords, etc? Yes  Adequate lighting in your home to reduce risk of falls? Yes   ASSISTIVE DEVICES UTILIZED TO PREVENT FALLS:  Life alert? No  Use of a cane, walker or w/c? No  Grab bars in the bathroom? No  Shower chair or bench in shower? No  Elevated toilet seat or a handicapped toilet? No   TIMED UP AND GO:  Was the test performed? No . Telephonic visit  Cognitive Function: Normal cognitive status assessed by direct observation by this Nurse Health Advisor. No abnormalities found.    MMSE - Mini Mental State Exam 02/18/2018  Orientation to time 5  Orientation to Place 5  Registration 3  Attention/ Calculation 5  Recall 3  Language- name 2 objects 2  Language- repeat 1  Language- follow 3 step command 3  Language- read & follow direction 1  Write a sentence 1  Copy design 1  Total score 30        Immunizations Immunization History  Administered Date(s) Administered   Influenza Inj Mdck Quad Pf 09/02/2019  Influenza, Quadrivalent, Recombinant, Inj, Pf 07/01/2018   Influenza,inj,quad, With Preservative 07/19/2018    Influenza-Unspecified 05/23/2009, 05/23/2010   Janssen (J&J) SARS-COV-2 Vaccination 12/02/2019   Moderna Sars-Covid-2 Vaccination 12/02/2019   Pneumococcal Conjugate-13 12/19/2016   Pneumococcal Polysaccharide-23 07/01/2018   Pneumococcal-Unspecified 07/19/2018   Tdap 10/21/2016   Zoster, Live 01/02/2012    TDAP status: Up to date  Flu Vaccine status: Due, Education has been provided regarding the importance of this vaccine. Advised may receive this vaccine at local pharmacy or Health Dept. Aware to provide a copy of the vaccination record if obtained from local pharmacy or Health Dept. Verbalized acceptance and understanding.  Pneumococcal vaccine status: Up to date  Covid-19 vaccine status: Declined, Education has been provided regarding the importance of this vaccine but patient still declined. Advised may receive this vaccine at local pharmacy or Health Dept.or vaccine clinic. Aware to provide a copy of the vaccination record if obtained from local pharmacy or Health Dept. Verbalized acceptance and understanding.  Qualifies for Shingles Vaccine? Yes   Zostavax completed Yes   Shingrix Completed?: No.    Education has been provided regarding the importance of this vaccine. Patient has been advised to call insurance company to determine out of pocket expense if they have not yet received this vaccine. Advised may also receive vaccine at local pharmacy or Health Dept. Verbalized acceptance and understanding.  Screening Tests Health Maintenance  Topic Date Due   Zoster Vaccines- Shingrix (1 of 2) Never done   COVID-19 Vaccine (2 - Moderna series) 12/30/2019   INFLUENZA VACCINE  03/05/2021   TETANUS/TDAP  10/22/2026   COLONOSCOPY (Pts 45-76yr Insurance coverage will need to be confirmed)  11/06/2027   Hepatitis C Screening  Completed   PNA vac Low Risk Adult  Completed   HPV VACCINES  Aged Out    Health Maintenance  Health Maintenance Due  Topic Date Due   Zoster Vaccines-  Shingrix (1 of 2) Never done   COVID-19 Vaccine (2 - Moderna series) 12/30/2019   INFLUENZA VACCINE  03/05/2021    Colorectal cancer screening: Type of screening: Colonoscopy. Completed 11/05/2017. Repeat every 10 years  Lung Cancer Screening: (Low Dose CT Chest recommended if Age 70-80years, 30 pack-year currently smoking OR have quit w/in 15years.) does not qualify.  Additional Screening:  Hepatitis C Screening: does qualify; Completed 09/01/2020  Vision Screening: Recommended annual ophthalmology exams for early detection of glaucoma and other disorders of the eye. Is the patient up to date with their annual eye exam?  Yes  Who is the provider or what is the name of the office in which the patient attends annual eye exams? [facility] If pt is not established with a provider, would they like to be referred to a provider to establish care? No .   Dental Screening: Recommended annual dental exams for proper oral hygiene  Community Resource Referral / Chronic Care Management: CRR required this visit?  No   CCM required this visit?  No      Plan:     I have personally reviewed and noted the following in the patient's chart:   Medical and social history Use of alcohol, tobacco or illicit drugs  Current medications and supplements including opioid prescriptions. Patient is not currently taking opioid prescriptions. Functional ability and status Nutritional status Physical activity Advanced directives List of other physicians Hospitalizations, surgeries, and ER visits in previous 12 months Vitals Screenings to include cognitive, depression, and falls Referrals and appointments  In addition, I have  reviewed and discussed with patient certain preventive protocols, quality metrics, and best practice recommendations. A written personalized care plan for preventive services as well as general preventive health recommendations were provided to patient.     Sandrea Hammond,  LPN   [id number]   Nurse Notes: None

## 2021-06-01 ENCOUNTER — Ambulatory Visit (HOSPITAL_BASED_OUTPATIENT_CLINIC_OR_DEPARTMENT_OTHER): Payer: Medicare Other | Admitting: Cardiology

## 2021-06-22 DIAGNOSIS — H259 Unspecified age-related cataract: Secondary | ICD-10-CM | POA: Diagnosis not present

## 2021-06-22 DIAGNOSIS — H1789 Other corneal scars and opacities: Secondary | ICD-10-CM | POA: Diagnosis not present

## 2021-06-22 DIAGNOSIS — Z96642 Presence of left artificial hip joint: Secondary | ICD-10-CM | POA: Diagnosis not present

## 2021-06-22 DIAGNOSIS — H40013 Open angle with borderline findings, low risk, bilateral: Secondary | ICD-10-CM | POA: Diagnosis not present

## 2021-06-22 DIAGNOSIS — H5203 Hypermetropia, bilateral: Secondary | ICD-10-CM | POA: Diagnosis not present

## 2021-06-22 DIAGNOSIS — H18899 Other specified disorders of cornea, unspecified eye: Secondary | ICD-10-CM | POA: Diagnosis not present

## 2021-06-27 ENCOUNTER — Other Ambulatory Visit: Payer: Self-pay

## 2021-06-27 ENCOUNTER — Ambulatory Visit (INDEPENDENT_AMBULATORY_CARE_PROVIDER_SITE_OTHER): Payer: Medicare Other | Admitting: Cardiology

## 2021-06-27 ENCOUNTER — Encounter (HOSPITAL_BASED_OUTPATIENT_CLINIC_OR_DEPARTMENT_OTHER): Payer: Self-pay | Admitting: Cardiology

## 2021-06-27 VITALS — BP 138/88 | HR 67 | Ht 72.0 in | Wt 186.3 lb

## 2021-06-27 DIAGNOSIS — Z7189 Other specified counseling: Secondary | ICD-10-CM

## 2021-06-27 DIAGNOSIS — E7800 Pure hypercholesterolemia, unspecified: Secondary | ICD-10-CM | POA: Diagnosis not present

## 2021-06-27 DIAGNOSIS — E782 Mixed hyperlipidemia: Secondary | ICD-10-CM

## 2021-06-27 DIAGNOSIS — R030 Elevated blood-pressure reading, without diagnosis of hypertension: Secondary | ICD-10-CM

## 2021-06-27 NOTE — Patient Instructions (Signed)
Medication Instructions:  Your Physician recommend you continue on your current medication as directed.    *If you need a refill on your cardiac medications before your next appointment, please call your pharmacy*   Lab Work: None ordered today    Testing/Procedures: None ordered today    Follow-Up: At [facility], you and your health needs are our priority.  As part of our continuing mission to provide you with exceptional heart care, we have created designated Provider Care Teams.  These Care Teams include your primary Cardiologist (physician) and Advanced Practice Providers (APPs -  Physician Assistants and Nurse Practitioners) who all work together to provide you with the care you need, when you need it.  We recommend signing up for the patient portal called "MyChart".  Sign up information is provided on this After Visit Summary.  MyChart is used to connect with patients for Virtual Visits (Telemedicine).  Patients are able to view lab/test results, encounter notes, upcoming appointments, etc.  Non-urgent messages can be sent to your provider as well.   To learn more about what you can do with MyChart, go to [email or web address].    Your next appointment:   AS needed   The format for your next appointment:   In Person  Provider:   Buford Dresser, MD

## 2021-06-27 NOTE — Progress Notes (Signed)
Cardiology Office Note:    Date:  06/27/2021   ID:  [name], DOB 11/06/1950, MRN [id number]  PCP:  Sharion Balloon, FNP  Cardiologist:  Buford Dresser, MD PhD  Referring MD: Sharion Balloon, FNP   CC: Follow-up   History of Present Illness:    [name] is a 70 y.o. male with a hx of HLD, joint pain who is seen for follow-up. I initially met him 04/21/2019 as a new consult at the request of Sharion Balloon, FNP for the evaluation and management of hyperlipidemia and cardiovascular risk.  Cardiovascular risk factors: Prior clinical ASCVD: none Comorbid conditions: HLD only. Has low testosterone medically, on weekly IM shots. Metabolic syndrome/Obesity: BMI 26 Chronic inflammatory conditions: none Tobacco use history: 1ppd for several years, quit in 1995. No cravings. Family history: grandmother and grandfather had late in life heart disease, died in their 33s/90s. Father had Alzheimer's, mother had renal cell carcinoma, both deceased. Prior cardiac testing and/or incidental findings on other testing (ie coronary calcium): none (no prior CT scans of chest Exercise level: goes to the gym daily or at minimum 5x/week. Rides bike, lifts weights. Can go up to 10 miles on bike, works around the house, [employer] without issues.  Current diet: overall good. Wife is a good cook, eats a lot of chicken and fish. Salads every night, almost no fried food.  Today: Overall he is feeling good. Currently he has no concerns about his heart.   At his other clinic visits he is unsure if his blood pressure has been elevated. He denies feeling stressed.  For exercise, he has been using a stationary bike more frequently, up to 12 miles in 45 min with high intensity. Has not yet felt limited. He also goes to the gym regularly, and has lost 15 lbs.  He underwent a left hip revision 06/2020.  Of note, he was previously infected with Covid.  He denies any palpitations, chest pain, or  shortness of breath. No lightheadedness, headaches, syncope, orthopnea, PND, lower extremity edema or exertional symptoms.  Past Medical History:  Diagnosis Date   Actinic keratosis    Arthritis    hip , shoulders    Cancer ([place])    hx of skin cancer    Diverticulitis    GERD (gastroesophageal reflux disease)    Hyperlipidemia    Osteoarthritis    left Hip   Pneumonia    hx of years ago    Polyp, sigmoid colon    Testosterone deficiency 2012    Past Surgical History:  Procedure Laterality Date   COLONOSCOPY  10/10/2006   EYE SURGERY Bilateral 1990   SHOULDER ARTHROSCOPY  11/2017   TOTAL HIP ARTHROPLASTY  10/01/2011   Procedure: TOTAL HIP ARTHROPLASTY ANTERIOR APPROACH;  Surgeon: Mauri Pole, MD;  Location: [facility];  Service: Orthopedics;  Laterality: Left;   TOTAL HIP REVISION Left 06/22/2020   Procedure: TOTAL HIP REVISION;  Surgeon: Paralee Cancel, MD;  Location: [facility];  Service: Orthopedics;  Laterality: Left;  2.5 hrs    Current Medications: Current Outpatient Medications on File Prior to Visit  Medication Sig   anastrozole (ARIMIDEX) 1 MG tablet TAKE 1/2 TABLET ONCE A WEEK   Cholecalciferol (VITAMIN D3) 75 MCG (3000 UT) TABS Take 3,000 Units by mouth daily.   rosuvastatin (CRESTOR) 10 MG tablet TAKE 1 TABLET BY MOUTH EVERY DAY   testosterone cypionate (DEPOTESTOSTERONE CYPIONATE) 200 MG/ML injection Inject 0.5 mLs (100 mg total) into the muscle  once a week.   No current facility-administered medications on file prior to visit.     Allergies:   Patient has no known allergies.   Social History   Socioeconomic History   Marital status: Married    Spouse name: Not on file   Number of children: 2   Years of education: 16   Highest education level: Bachelor's degree (e.g., BA, AB, BS)  Occupational History   Occupation: CEO    Comment: retired   Tobacco Use   Smoking status: Former    Packs/day: 1.00    Types: Cigarettes    Start date: 04/02/1983    Quit  date: 04/01/1988    Years since quitting: 33.2   Smokeless tobacco: Never  Vaping Use   Vaping Use: Never used  Substance and Sexual Activity   Alcohol use: Yes    Comment: glass of wine or beer daily    Drug use: Never   Sexual activity: Yes  Other Topics Concern   Not on file  Social History Narrative   Daily caffeine    Lives home with wife   Children live nearby   Social Determinants of Health   Financial Resource Strain: Low Risk    Difficulty of Paying Living Expenses: Not hard at all  Food Insecurity: No Food Insecurity   Worried About Charity fundraiser in the Last Year: Never true   [place] in the Last Year: Never true  Transportation Needs: No Transportation Needs   Lack of Transportation (Medical): No   Lack of Transportation (Non-Medical): No  Physical Activity: Sufficiently Active   Days of Exercise per Week: 5 days   Minutes of Exercise per Session: 90 min  Stress: No Stress Concern Present   Feeling of Stress : Not at all  Social Connections: Moderately Integrated   Frequency of Communication with Friends and Family: More than three times a week   Frequency of Social Gatherings with Friends and Family: More than three times a week   Attends Religious Services: More than 4 times per year   Active Member of [employer] or Organizations: No   Attends Archivist Meetings: Never   Marital Status: Married     Family History: The patient's family history includes Alzheimer's disease (age of onset: 20) in his father; Bipolar disorder in his son; Heart disease in his paternal grandmother; Kidney cancer (age of onset: 34) in his mother; Renal cancer in his mother; Stroke in his paternal uncle. There is no history of Colon cancer, Esophageal cancer, Rectal cancer, Stomach cancer, Liver cancer, Pancreatic cancer, or Prostate cancer.  ROS:   Please see the history of present illness. All other systems are reviewed and negative.    EKGs/Labs/Other Studies  Reviewed:    The following studies were reviewed today: No prior cardiac studies  EKG:  EKG is personally reviewed.   06/27/2021: NSR at 67 bpm 04/21/2019: sinus bradycardia with 1st degree AV block, HR 57 BPM  Recent Labs: 02/27/2021: ALT 25; BUN 14; Creatinine, Ser 1.13; Hemoglobin 15.7; Platelets 158; Potassium 4.7; Sodium 139   Recent Lipid Panel    Component Value Date/Time   CHOL 143 02/27/2021 0958   CHOL 138 11/23/2012 0828   TRIG 95 02/27/2021 0958   TRIG 75 06/20/2017 1655   TRIG 140 11/23/2012 0828   HDL 48 02/27/2021 0958   HDL 52 06/20/2017 1655   HDL 44 11/23/2012 0828   CHOLHDL 3.0 02/27/2021 0958   LDLCALC 77  02/27/2021 0958   LDLCALC 69 03/07/2014 0815   LDLCALC 66 11/23/2012 0828    Physical Exam:    VS:  BP 138/88 (BP Location: Left Arm, Patient Position: Sitting, Cuff Size: Large)   Pulse 67   Ht 6' (1.829 m)   Wt 186 lb 4.8 oz (84.5 kg)   BMI 25.27 kg/m     Wt Readings from Last 3 Encounters:  06/27/21 186 lb 4.8 oz (84.5 kg)  04/06/21 180 lb (81.6 kg)  03/01/21 184 lb 12.8 oz (83.8 kg)    GEN: Well nourished, well developed in no acute distress HEENT: Normal, moist mucous membranes NECK: No JVD CARDIAC: regular rhythm, normal S1 and S2, no murmurs, rubs, gallops.  VASCULAR: Radial and DP pulses 2+ bilaterally. No carotid bruits RESPIRATORY:  Clear to auscultation without rales, wheezing or rhonchi  ABDOMEN: Soft, non-tender, non-distended MUSCULOSKELETAL:  Ambulates independently SKIN: Warm and dry, no edema NEUROLOGIC:  Alert and oriented x 3. No focal neuro deficits noted. PSYCHIATRIC:  Normal affect    ASSESSMENT:    1. Pure hypercholesterolemia   2. Elevated blood pressure reading   3. Cardiac risk counseling   4. Counseling on health promotion and disease prevention     PLAN:    History of hyperlipidemia: excellent control. No increased risk factors (did discuss testosterone use, lack of clear data in appropriate testosterone  deficiency and relationship to CAD). -doing well on rosuvastatin, discussed this is best prevention medication long term for ASCVD -excellent diet/exercise -lipids per [facility] 02/27/21: Tchol 143, HDL 48, LDL 77, TG 95  Elevated BP reading: denies history of hypertension, on no meds.  -we discussed goal BP, when we start medications -he wishes to continue to follow with his PCP at this time  Cardiac risk counseling and prevention recommendations: -recommend heart healthy/Mediterranean diet, with whole grains, fruits, vegetable, fish, lean meats, nuts, and olive oil. Limit salt. -recommend moderate walking, 3-5 times/week for 30-50 minutes each session. Aim for at least 150 minutes.week. Goal should be pace of 3 miles/hours, or walking 1.5 miles in 30 minutes -recommend avoidance of tobacco products. Avoid excess alcohol.  Plan for follow up: I would be happy to see him back as needed in the future  Medication Adjustments/Labs and Tests Ordered: Current medicines are reviewed at length with the patient today.  Concerns regarding medicines are outlined above.   Orders Placed This Encounter  Procedures   EKG 12-Lead    No orders of the defined types were placed in this encounter.  Patient Instructions  Medication Instructions:  Your Physician recommend you continue on your current medication as directed.    *If you need a refill on your cardiac medications before your next appointment, please call your pharmacy*   Lab Work: None ordered today    Testing/Procedures: None ordered today    Follow-Up: At [facility], you and your health needs are our priority.  As part of our continuing mission to provide you with exceptional heart care, we have created designated Provider Care Teams.  These Care Teams include your primary Cardiologist (physician) and Advanced Practice Providers (APPs -  Physician Assistants and Nurse Practitioners) who all work together to provide you with the care  you need, when you need it.  We recommend signing up for the patient portal called "MyChart".  Sign up information is provided on this After Visit Summary.  MyChart is used to connect with patients for Virtual Visits (Telemedicine).  Patients are able to view lab/test results,  encounter notes, upcoming appointments, etc.  Non-urgent messages can be sent to your provider as well.   To learn more about what you can do with MyChart, go to [email or web address].    Your next appointment:   AS needed   The format for your next appointment:   In Person  Provider:   Buford Dresser, MD   [facility] Stumpf,acting as a scribe for Buford Dresser, MD.,have documented all relevant documentation on the behalf of Buford Dresser, MD,as directed by  Buford Dresser, MD while in the presence of Buford Dresser, MD.  I, Buford Dresser, MD, have reviewed all documentation for this visit. The documentation on 07/04/21 for the exam, diagnosis, procedures, and orders are all accurate and complete.    Signed, Buford Dresser, MD PhD 06/27/2021 6:22 PM    [facility]

## 2021-07-02 ENCOUNTER — Other Ambulatory Visit: Payer: Self-pay | Admitting: Family

## 2021-07-02 DIAGNOSIS — E349 Endocrine disorder, unspecified: Secondary | ICD-10-CM

## 2021-07-02 NOTE — Telephone Encounter (Signed)
  Prescription Request  07/02/2021  Is this a "Controlled Substance" medicine? no  Have you seen your PCP in the last 2 weeks? no  If YES, route message to pool  -  If NO, patient needs to be scheduled for appointment.  What is the name of the medication or equipment? Testosterone Meds  Have you contacted your pharmacy to request a refill? yes   Which pharmacy would you like this sent to? [facility] in [place]   Patient notified that their request is being sent to the clinical staff for review and that they should receive a response within 2 business days.    Lenna Gilford' pt.  His Insurance has changed, so he had to switch Pharmacies.  He now uses [facility] in [place].

## 2021-07-03 MED ORDER — TESTOSTERONE CYPIONATE 200 MG/ML IM SOLN: 100.0000 mg | INTRAMUSCULAR | 2 refills | Status: AC

## 2021-07-03 NOTE — Telephone Encounter (Signed)
Pt having to change pharmacy, Last OV & RF 03/01/21 next OV 09/03/21

## 2021-07-04 ENCOUNTER — Encounter (HOSPITAL_BASED_OUTPATIENT_CLINIC_OR_DEPARTMENT_OTHER): Payer: Self-pay | Admitting: Cardiology

## 2021-07-04 NOTE — Telephone Encounter (Signed)
Pt aware medication sent to [facility]

## 2021-07-10 ENCOUNTER — Telehealth: Payer: Self-pay | Admitting: Family

## 2021-07-10 NOTE — Telephone Encounter (Signed)
(  Key: [id number])  Testosterone Cypionate 200MG /ML intramuscular solution   Your information has been submitted to [place]. [employer] [place] will review the request and notify you of the determination decision directly, typically within 3 business days of your submission and once all necessary information is received.  You will also receive your request decision electronically. To check for an update later, open the request again from your dashboard.  If [employer] [place] has not responded within the specified timeframe or if you have any questions about your PA submission, contact [place] directly at [facility]) [phone] or ([place]) [phone].

## 2021-07-10 NOTE — Telephone Encounter (Signed)
Approved today Effective from 07/10/2021 through 07/10/2022.  [facility] aware

## 2021-07-12 ENCOUNTER — Telehealth: Payer: Self-pay | Admitting: Family

## 2021-07-12 NOTE — Telephone Encounter (Signed)
[employer] calling about PA for a medicine. Please call back.

## 2021-08-18 ENCOUNTER — Other Ambulatory Visit: Payer: Self-pay | Admitting: Family

## 2021-08-18 DIAGNOSIS — E782 Mixed hyperlipidemia: Secondary | ICD-10-CM

## 2021-08-23 ENCOUNTER — Telehealth: Payer: Self-pay | Admitting: Family

## 2021-08-24 ENCOUNTER — Other Ambulatory Visit: Payer: Self-pay | Admitting: Family Medicine

## 2021-08-24 DIAGNOSIS — E349 Endocrine disorder, unspecified: Secondary | ICD-10-CM

## 2021-08-24 DIAGNOSIS — E782 Mixed hyperlipidemia: Secondary | ICD-10-CM

## 2021-08-24 DIAGNOSIS — N400 Enlarged prostate without lower urinary tract symptoms: Secondary | ICD-10-CM

## 2021-08-24 NOTE — Telephone Encounter (Signed)
[email or web address]

## 2021-08-24 NOTE — Telephone Encounter (Signed)
Orders in pt aware

## 2021-09-03 ENCOUNTER — Ambulatory Visit: Payer: Medicare Other | Admitting: Family

## 2021-10-31 ENCOUNTER — Other Ambulatory Visit: Payer: Self-pay | Admitting: Family

## 2021-10-31 DIAGNOSIS — E349 Endocrine disorder, unspecified: Secondary | ICD-10-CM

## 2021-10-31 NOTE — Telephone Encounter (Signed)
Pt declined to make appt her is No longer a pt at [facility] ?

## 2021-10-31 NOTE — Telephone Encounter (Signed)
[place] last OV 03/01/21 ?

## 2021-11-26 ENCOUNTER — Other Ambulatory Visit: Payer: Self-pay | Admitting: Family

## 2021-11-26 DIAGNOSIS — E349 Endocrine disorder, unspecified: Secondary | ICD-10-CM

## 2022-02-21 ENCOUNTER — Other Ambulatory Visit: Payer: Self-pay | Admitting: Family

## 2022-02-21 DIAGNOSIS — E782 Mixed hyperlipidemia: Secondary | ICD-10-CM

## 2023-11-07 ENCOUNTER — Ambulatory Visit (INDEPENDENT_AMBULATORY_CARE_PROVIDER_SITE_OTHER): Admitting: Psychiatry

## 2023-11-07 DIAGNOSIS — F4321 Adjustment disorder with depressed mood: Secondary | ICD-10-CM

## 2023-11-07 DIAGNOSIS — Z630 Problems in relationship with spouse or partner: Secondary | ICD-10-CM

## 2023-11-07 NOTE — Progress Notes (Signed)
 PROBLEM-FOCUSED INITIAL PSYCHOTHERAPY EVALUATION [name], PhD LP [facility]  Name: KAEVION SINCLAIR Date: 11/07/2023 Time spent: 50 min MRN: [id number] DOB: 12/17/1950 Guardian/Payee: self  PCP: Lazoff, Shawn P, DO Documentation requested on this visit: No  PROBLEM HISTORY Reason for Visit /Presenting Problem:  Chief Complaint  Patient presents with   Establish Care   Family Problem   Anxiety    Narrative/History of Present Illness [problem, parameters, solutions in practice, personal theories what is going on] Self-referred 73yo MWM reports marriage in trouble and he's seeking therapy on request to save it.  Wife has been segregating within the house, moved upstairs recently.  Sex life has eroded, and she seems to have started the clock to divorce, knowing she consulted an attorney and has let him know.  She'll say she still loves him, and they will still go out to eat, but clear otherwise she's headed for dissolution.  No hx of abuse, infidelity, or some other grave offense, as far as he knows.  Few clues, except that [name] has charged him with narcissism and tended to mention again strong memories of the day when he did something that quite offended her, like when they were in young family and he was working long hours.  Remote history of marriage counseling that she says didn't work.  Says he has been trying to save the marriage, e.g., by filling in quiet spaces talking, and starting to solicit her more asking her about her day.  No sexual engagement presently.  Says she's never been the initiator, and now she's experienced physical changes that make it painful, between menopause and breast cancer, so he's clear not to put any pressure on.  Still in habit of going out Friday nights.  Can watch shows together, including relatively new interest in The Chosen, which he didn't expect to enjoy but is.  Feels she's hanged as she has undergone a revival in her [place]  faith, working as a Architect at a [employer]).  Has felt a gulf, spiritually, being pretty well agnostic himself, but again, he has been enjoying shares like The Chosen and suggests it makes faith more appealing, if undecided.  [name]'s sister, from [place]  state, is divorced and relocated nearby.  Says he was late in life to marry, after him playing the field a while and [place] met, in [place] .  They have twin sons, one Bipolar, effectively treated and presenting no particular stresses now.    Another reason for coming is to be happier in general.  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  Live in [place], financially secure.  Physically active, noting severe spinal stenosis, which he keeps in check by working out regularly at the gym, keeping his core strong.  Prior Psychiatric Assessment/Treatment:   Outpatient treatment: remote hx marriage counseling Psychiatric hospitalization: none stated Psychological assessment/testing: none stated   Abuse/neglect screening: Victim of abuse: Not assessed at this time / none suspected.   Victim of neglect: Not assessed at this time / none suspected.   Perpetrator of abuse/neglect: Not assessed at this time / none suspected.   Witness / Exposure to Domestic Violence: Not assessed at this time / none suspected.   Witness to Community Violence:  Not assessed at this time / none suspected.   Protective Services Involvement: No.   Report needed: No.  Substance abuse screening: Current substance abuse: Not assessed at this time / none suspected.   History of impactful substance use/abuse: Not assessed at this time / none suspected.     FAMILY/SOCIAL HISTORY Family of origin -- deferred Family of intention/current living situation -- married as above, at risk Education -- deferred, presumed  Dealer -- retired Forensic psychologist -- Not assessed Spiritually -- Spiritual/religious identification Agnostic Enjoyable activities -- travel, eating out, entertainment Other situational factors affecting treatment and prognosis: Stressors from the following areas: Marital or family conflict Barriers to service: availability  Notable cultural sensitivities: none stated Strengths: Self Advocate and Able to Communicate Effectively   MED/SURG HISTORY Med/surg history was not reviewed with Pt at this time.  Of note for psychotherapy at this time are aging diseases which may exacerbate existential anxiety, and use of testosterone  supplement.   Past Medical History:  Diagnosis Date   Actinic keratosis    Arthritis    hip , shoulders    Cancer ([facility])    hx of skin cancer    Diverticulitis    GERD (gastroesophageal reflux disease)    Hyperlipidemia    Osteoarthritis    left Hip   Pneumonia    hx of years ago    Polyp, sigmoid colon    Testosterone  deficiency 2012     Past Surgical History:  Procedure Laterality Date   COLONOSCOPY  10/10/2006   EYE SURGERY Bilateral 1990   SHOULDER ARTHROSCOPY  11/2017   TOTAL HIP ARTHROPLASTY  10/01/2011   Procedure: TOTAL HIP ARTHROPLASTY ANTERIOR APPROACH;  Surgeon: Donnice JONETTA Car, MD;  Location: [facility];  Service: Orthopedics;  Laterality: Left;   TOTAL HIP REVISION Left 06/22/2020   Procedure: TOTAL HIP REVISION;  Surgeon: Car Donnice, MD;  Location: [facility];  Service: Orthopedics;  Laterality: Left;  2.5 hrs    No Known Allergies  Medications (as listed in Epic): Current Outpatient Medications  Medication Sig Dispense Refill   anastrozole  (ARIMIDEX ) 1 MG tablet TAKE 1/2 TABLET ONCE A WEEK 6 tablet 7   Cholecalciferol (VITAMIN D3) 75 MCG (3000 UT) TABS Take 3,000 Units by mouth daily.     rosuvastatin  (CRESTOR ) 10 MG tablet TAKE 1 TABLET BY MOUTH EVERY DAY 90 tablet 0   testosterone  cypionate (DEPOTESTOSTERONE CYPIONATE) 200  MG/ML injection Inject 0.5 mLs (100 mg total) into the muscle once a week. 10 mL 2   No current facility-administered medications for this visit.    MENTAL STATUS AND OBSERVATIONS Appearance:   Casual and lean, stylish, tan     Behavior:  Appropriate and some rationalizing  Motor:  Forward posture, twitches  Speech/Language:   Mild pressure  Affect:  Appropriate  Mood:  anxious  Thought process:  normal  Thought content:    WNL  Sensory/Perceptual disturbances:    WNL  Orientation:  Fully oriented  Attention:  Good  Concentration:  Fair  Memory:  WNL  Fund of knowledge:   Good  Insight:    Fair  Judgment:   Good  Impulse Control:  Good   Initial Risk Assessment: Danger to self: No Self-injurious behavior: No Danger to others: No Physical aggression / violence: No Duty to warn: No Access to firearms a concern: No Gang involvement or other high risk behavior: No Patient / guardian was educated about steps to take if suicide or homicide risk level increases between visits: yes While future psychiatric events cannot be accurately predicted, the patient does not currently require acute inpatient psychiatric  care and does not currently meet [place]  involuntary commitment criteria.   DIAGNOSIS:    ICD-10-CM   1. Adjustment disorder with depressed mood  F43.21     2. Relationship problem between partners  Z63.0       INITIAL TREATMENT: Support/validation provided for distressing symptoms and confirmed rapport Ethical orientation and informed consent confirmed re: Privacy rights -- including but not limited to HIPAA, EMR and use of e-PHI Working alliance -- expectations for working relationship in psychotherapy, initial orientation to cognitive-behavioral and solution-focused therapy approach Patient responsibilities -- scheduling, fair notice of changes, in-person vs. telehealth and regulatory and financial conditions affecting choice Coordination of care -- needs and  consents for working partnerships and exchange of information with other health care providers, especially any medication and other behavioral health providers Outlook for therapy -- scheduling constraints, availability of crisis service, inclusion of family member(s) as appropriate Psychoeducation and initial recommendations: Noted waffling about the situation, reflexive changes of subject and alternating characterizations, discussed his actual appraisal of the situation with [name].  Reviewing his handling of all these signs without questions, zeroed in on how he's been avoiding the big question and supposing instead of actually asking [name] where she stands.  Encouraged to ask her to help him understand whether it is already decided or he has some opportunity yet to make good on their relationship.  Forewarned he may have to deal with some ambiguity and doubt yet, but asking is the only way to reduce uncertainty. Reviewed and affirmed his interest in correcting course in their marriage, as opposed to foreclosing on it.  Expressed a few times how if it's over for her, then he'll just quit cold, let it become the battle it will be for property; encouraged he avoid thinking about quitting the marriage without knowing for certain how it will go, because it sounds like she is holding out some form of hope, and he deserves to find out both whether he would be jumping to conclusions and that he can be OK whichever reality he has, without having to take drastic action or win a fight. Discussed inviting [name] to therapy for clarification and potential couple work Affirmed efforts made to be more receptive, listening, and curious without judgment Not necessary to settle the idea of narcissism, just reckon with behaviors that alienate, improve pro-relationship behaviors, and see.  Any work on his own tendencies he does now will serve him in this relationship or any relationship that would follow, if it must  be.  Plan: Ask [name] where things stand (remediable v. Irrevocable) Offer to meet together for factfinding, clarification, guidance Practice obvious good relationship skills -- listening, turn taking, shared quality time Maintain medication as prescribed and work faithfully with relevant prescriber(s) if any changes are desired or seem indicated Call the clinic on-call service, present to ER, or call 911 if any life-threatening psychiatric crisis Return 2 wks if able, [place] list otherwise, for recommend sched ahead.  Lamar Kendall, PhD  [name], PhD LP Clinical Psychologist, [facility] [address] [place], [place] [address] [phone]

## 2023-11-28 ENCOUNTER — Ambulatory Visit: Admitting: Psychiatry

## 2023-11-28 DIAGNOSIS — Z630 Problems in relationship with spouse or partner: Secondary | ICD-10-CM | POA: Diagnosis not present

## 2023-11-28 DIAGNOSIS — F4321 Adjustment disorder with depressed mood: Secondary | ICD-10-CM

## 2023-11-28 NOTE — Progress Notes (Signed)
 Psychotherapy Progress Note [facility] [name], PhD LP  Patient ID: [name])    MRN: [id number] Therapy format: Family therapy w/ patient -- accompanied by [name] Date: 11/28/2023      Start: 9:09a     Stop: 10:05a     Time Spent: 56 min Location: In-person   Session narrative (presenting needs, interim history, self-report of stressors and symptoms, applications of prior therapy, status changes, and interventions made in session) W Pals in attendance today, on invitation from [place].  Longtime patient of Zell Hoof, [facility], formerly of this office, who originally recommended T[place].  She acknowledges a separation within the house, living upstairs but sharing time as noted for meals, outings, and TV viewing of choice, like [employer].  8 wks ago when she moved upstairs and began talking legal separation/divorce.  Brings 3 pages of tightly spaced typed notes with her to explain what behavior has alienated and how things made her feel -- unable to go through in detail but skimmed and thanked for the background, confirmed [name] has his copy and seen her complaints.  Very pleasant approach, confirms history of deep-running loneliness and feeling demeaned at important times and routinely enough treated like the accessory among friends.  Careful not to charge narcissism, but asserts patterns that fit.  Agreed the label can fit, thought it may be of limited use in trying to restore relationship.  For his part, [name] is physically restless, ticcy, but patient enough to let her speak, and readily acknowledging of faults mentioned, without fight or indignance.  Confirmed that she is not closed to hope of him changing, and she has seen substantially more friendly, respectful, and helpful behavior in recent weeks, just not sure how much stick to put in it, whether it's a short-term motivation to keep her or a truer change of heart from how he's been for years.  Granted that  they are each free to decide, but impression so far is that they certainly have potential to heal, and he to get better and more reliable in ways that matter to her.  Worked out strategy to meet individually still to work on his pro-relationship behaviors, fair conflict, and, where chosen, making amends, and that it would be wise to meet again together about every 4 individual sessions.  Noted understanding that they can obtain a divorce if they choose a year after she moved upstairs, though this may be a wishful interpretation of law.  In any event, agreement to work toward experience-based renewal of trust and Editor, commissioning as a husband, without deadline and without divorce planning, until further discussed.  Therapeutic modalities: Cognitive Behavioral Therapy, Solution-Oriented/Positive Psychology, Ego-Supportive, and Interpersonal  Mental Status/Observations:  Appearance:   Casual     Behavior:  Appropriate  Motor:  Normal  Speech/Language:   Clear and Coherent  Affect:  Appropriate  Mood:  anxious  Thought process:  normal  Thought content:    WNL  Sensory/Perceptual disturbances:    WNL  Orientation:  Fully oriented  Attention:  Good    Concentration:  Good  Memory:  WNL  Insight:    Variable  Judgment:   Good  Impulse Control:  Good   Risk Assessment: Danger to Self: No Self-injurious Behavior: No Danger to Others: No Physical Aggression / Violence: No Duty to Warn: No Access to Firearms a concern: No  Assessment of progress:  progressing  Diagnosis:   ICD-10-CM   1. Adjustment disorder with depressed  mood  F43.21     2. Relationship problem between partners  Z63.0      Plan:  Terms of therapy -- Hybrid individual/marital, with joint visits about every 5th session and PRN beyond that.  At discretion, [name] may continue her therapy with my former colleague.  Option to ROI to confer, but written notes provided are very helpful and clear to help inform individual work.   Terms of separation/divorce -- Endorse separate bedrooms for now, in order to help keep it clear to [name] he is in a remedial courting stage, no guarantee of continuing the marriage, but in the course of a faithful try.  No pressure to renew physical affection, but nonsexual interaction allowable at [facility] discretion.   Improving pro-marriage behavior -- Continue efforts in listening, turn taking, shared quality time, and empathetic inquiry. Amends-making -- Drawing from the clarity of notes provided, will take up comprehending and making amends for memorable alienating episodes and habits.  Focus on recognition of negative behavior, effects on the offended, insight what his own motivations were, and cogent picture of what he could do more constructively with the same feelings again.  Understood that restored trust from his wife is a choice made in courage, not a guaranteed result, but what he can do to foster it is to remain patient, listen for comprehension not defense, and take opportunities as they come to show reliably congruent words and actions under stress. Other recommendations/advice -- As may be noted above.  Continue to utilize previously learned skills ad lib. Medication compliance -- Maintain medication as prescribed and work faithfully with relevant prescriber(s) if any changes are desired or seem indicated. Crisis service -- Aware of call list and work-in appts.  Call the clinic on-call service, 988/hotline, 911, or present to [facility] or ER if any life-threatening psychiatric crisis. Followup -- Return for time as available, avail earlier @ PT's need, needs ROI.  Next scheduled visit with me 12/17/2023.  Next scheduled in this office 12/17/2023.  Lamar Kendall, PhD [name], PhD LP Clinical Psychologist, [facility] [address] [place], [place] [address] [phone]

## 2023-12-17 ENCOUNTER — Ambulatory Visit: Admitting: Psychiatry

## 2024-01-02 ENCOUNTER — Ambulatory Visit: Admitting: Psychiatry

## 2024-01-05 ENCOUNTER — Ambulatory Visit: Admitting: Psychiatry

## 2024-01-05 DIAGNOSIS — Z630 Problems in relationship with spouse or partner: Secondary | ICD-10-CM

## 2024-01-05 DIAGNOSIS — F4321 Adjustment disorder with depressed mood: Secondary | ICD-10-CM | POA: Diagnosis not present
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
## 2024-01-15 ENCOUNTER — Ambulatory Visit: Admitting: Psychiatry

## 2024-01-15 DIAGNOSIS — Z630 Problems in relationship with spouse or partner: Secondary | ICD-10-CM | POA: Diagnosis not present

## 2024-01-15 DIAGNOSIS — F4321 Adjustment disorder with depressed mood: Secondary | ICD-10-CM

## 2024-01-15 NOTE — Progress Notes (Signed)
 Psychotherapy Progress Note [facility] [name], PhD LP  Patient ID: [name])    MRN: [id number] Therapy format: Individual psychotherapy Date: 01/15/2024      Start: 3:17p     Stop: 4:03p     Time Spent: 46 min Location: In-person   Session narrative (presenting needs, interim history, self-report of stressors and symptoms, applications of prior therapy, status changes, and interventions made in session) Was set to ask [name], as planned, for clarification on her feelings, but then she seized the moment while going out to dinner to ask him to talk instead.  When she did, she asked, constructively, if he'd read her grievance notes (yes, 7x or more), and then, risking much more, asked him if he feels any regret or shame, using very forward body language and showing great tension, almost spitting, by his account.  While it could have been offputting or interpreted as haranguing, he tried to stay with it and not react in anger, let her know yes, he does, deeply.  Predictably difficult for her to decide if his word could be trusted, and it is unclear without witnessing it whether it indicates she is at cross purposes with herself or just overtaken with taking the long-suppressed opportunity to face him assertively when she plausibly felt she failed before.    For his part, [name] noted the body language and felt like it might have dawned on him that yes, she has decided, and they're just pretending now.  Doubts continue, and he wants to ask whether we are just playing roles or actually working on it.  Cautioned against assuming things, partly because assuming is the end of the curiosity and sensitivity he has meant to show, and cynical assumptions will lead right back to living down to the dismissive approach she has legitimately complained about.  Coached in asking instead of who are we now?, how are you? re their prospects, and while doing so, be ready to  empathize with the likelihood that she is a walking split decision, that PART of her wants to feel safer by foreclosing on him, while part of her wants to hold out hope, both for religious and personal reasons.  At most, he should represent that he has a worrying mind, and if his fears of decided have come true, it would be kindness to him to tell him.  Beyond that, if she says no, not at this point, believe her, and continue working on sensitivity to her feelings and acknowledgment and curiosity how past actions have affected her, in service of making amends.  Therapeutic modalities: Cognitive Behavioral Therapy, Solution-Oriented/Positive Psychology, and Ego-Supportive  Mental Status/Observations:  Appearance:   Casual     Behavior:  Appropriate  Motor:  Normal, some restlessness  Speech/Language:   Clear and Coherent  Affect:  Appropriate  Mood:  anxious  Thought process:  normal  Thought content:    WNL  Sensory/Perceptual disturbances:    WNL  Orientation:  Fully oriented  Attention:  Good    Concentration:  Fair  Memory:  WNL  Insight:    Fair  Judgment:   Good  Impulse Control:  Good   Risk Assessment: Danger to Self: No Self-injurious Behavior: No Danger to Others: No Physical Aggression / Violence: No Duty to Warn: No Access to Firearms a concern: No  Assessment of progress:  stabilized  Diagnosis:   ICD-10-CM   1. Adjustment disorder with depressed mood  F43.21  r/o NPD    2. Relationship problem between partners  Z63.0      Plan:  Terms of therapy -- Hybrid individual/marital, with joint visits about every 5th session and PRN beyond that.  At discretion, [name] may continue her therapy with my former colleague.  Option to ROI to confer, but written notes provided are very helpful and clear to help inform individual work.  Terms of separation/divorce -- Endorse separate bedrooms for now, in order to help keep it clear to [name] he is in a remedial courting stage,  no guarantee of continuing the marriage, but in the course of a faithful try.  No pressure to renew physical affection, but nonsexual interaction allowable at [facility] discretion.   Improving pro-marriage behavior -- Continue efforts in listening, turn taking, shared quality time, and empathetic inquiry.  Monitor and dispute intrusive thoughts that trend toward resentment, suggest he cut off relationship in return, or would sound denigrating if expressed aloud. Amends-making -- Drawing from the clarity of notes provided, will take up comprehending and making amends for memorable alienating episodes and habits.  Focus on recognition of negative behavior, effects on the offended, insight what his own motivations were, and cogent picture of what he could do more constructively with the same feelings again.  Understood that restored trust from his wife is a choice made in courage, not a guaranteed result, but what he can do to foster it is to remain patient, listen for comprehension not defense, and take opportunities as they come to show reliably congruent words and actions under stress. Other recommendations/advice -- As may be noted above.  Continue to utilize previously learned skills ad lib. Medication compliance -- Maintain medication as prescribed and work faithfully with relevant prescriber(s) if any changes are desired or seem indicated. Crisis service -- Aware of call list and work-in appts.  Call the clinic on-call service, 988/hotline, 911, or present to [facility] or ER if any life-threatening psychiatric crisis. Followup -- No follow-ups on file.  Next scheduled visit with me 02/02/2024.  Next scheduled in this office 02/02/2024.  Lamar Kendall, PhD [name], PhD LP Clinical Psychologist, [facility] [address], [address] [place], [place] [address] [phone]

## 2024-02-02 ENCOUNTER — Ambulatory Visit: Admitting: Psychiatry

## 2024-02-02 DIAGNOSIS — Z630 Problems in relationship with spouse or partner: Secondary | ICD-10-CM | POA: Diagnosis not present

## 2024-02-02 DIAGNOSIS — F4321 Adjustment disorder with depressed mood: Secondary | ICD-10-CM | POA: Diagnosis not present

## 2024-02-02 DIAGNOSIS — M419 Scoliosis, unspecified: Secondary | ICD-10-CM

## 2024-02-02 NOTE — Progress Notes (Signed)
 Psychotherapy Progress Note [facility] Dean Kendall, PhD LP  Patient ID: [name])    MRN: [id number] Therapy format: Family therapy w/ patient -- accompanied by [name] Date: 02/02/2024      Start: 9:16a     Stop: 10:05a     Time Spent: 49 min Location: In-person   Session narrative (presenting needs, interim history, self-report of stressors and symptoms, applications of prior therapy, status changes, and interventions made in session) [name] here today by surprise (had figured on one more individual meeting first).  Reports they have had some dinner dates, a movie date, and a family church visit yesterday at her church [employer] [facility]), each serving as some amount of quality time.  [name], for her part, says it feels like a holding pattern, and she is not yet available to be emotionally intimate, let alone physically, which is mutually understood.  She suggests he will need to discuss his FOO and upbringing to make real progress, while she brings up unsatisfyingly shallow answers to questions whether he regrets past actions and whether he knows what was going on with himself when he was demeaning to her.  It was August last year when she elected to separate within the house.  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  F was a Chief of Staff and not harsh, just preoccupied.  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  Confirmed that he will need to delve more into why he turned demeaning with [name] (per incidents noted in her letter) and how else he would want to address feelings like he had.    With [name],  supportively confronted about the inherent tension and difficulty querying [name] whether he regrets actions.  My understanding that he is conditioned to haste, and pursuing whether he regrets or feels guilty is more apt to increase tension at the precise time she needs him to relax and trust.  Confirmed he has told her he regrets actions, and that knowing better what else he would do with defensive feelings would be closer to satisfying any interest in recommitting t the relationship.  Did allow and foster [name] asking his burning question -- whether she has already decided about him -- which she was able to credibly answer no.  Fostered readiness to acknowledge feeling anxious or vulnerable, rather than only asking about attitudes in the service of making a decision, but on the whole asked her to consider him more anxious than narcissistic, per se, trying to protect against being too vulnerable being the core issue.  Point made that no matter how this goes, he will need to be big enough to put up with feeling vulnerable, and not get drawn into preventing or protecting against it.  By showing he is willing take without squirming or defending, he will show her an emotionally safer spouse.  At the same time, [name] may need to temper some analysis while he does the murky work of better understanding his own impulses and options.  Given the complexity of issues and likely unreadiness of Pt to plunge ahead with couples  counseling, planned for hybrid service to include 2 more individual meetings at this point then return together, mainly to work through comprehension of his alienating actions and better control of his restless to fix things.  Therapeutic modalities: Cognitive Behavioral Therapy and Solution-Oriented/Positive Psychology  Mental Status/Observations:  Appearance:   Casual     Behavior:  Appropriate  Motor:  Normal, some restlessness  Speech/Language:   Clear and Coherent  Affect:  Appropriate   Mood:  anxious  Thought process:  Some flight  Thought content:    WNL and worry  Sensory/Perceptual disturbances:    WNL  Orientation:  Fully oriented  Attention:  Good    Concentration:  Fair  Memory:  WNL  Insight:    Fair  Judgment:   Good  Impulse Control:  Good   Risk Assessment: Danger to Self: No Self-injurious Behavior: No Danger to Others: No Physical Aggression / Violence: No Duty to Warn: No Access to Firearms a concern: No  Assessment of progress:  progressing  Diagnosis:   ICD-10-CM   1. Adjustment disorder with depressed mood  F43.21     2. Relationship problem between partners  Z63.0     3. Scoliosis, unspecified scoliosis type, unspecified spinal region  M41.9      Plan:  Terms of therapy -- Hybrid individual/marital, schedule TBD.  At discretion, [name] may continue her therapy with my former colleague.  Option to ROI to confer, but written notes provided are very helpful and clear to help inform individual work.  Terms of separation/divorce -- Endorse separate bedrooms for now, in order to help keep it clear to [name] he is in a remedial stage of making amends and courting, understood no guarantee of continuing the marriage, but in the course of a faithful try.  No pressure to renew physical affection, but nonsexual interaction allowable at [facility] discretion.  Understood to be assessing what growth can happen to make it worth it vs not worth it to [name]. Improving pro-marriage behavior -- Continue efforts in patient listening, turn taking, shared quality time, and empathetic inquiry.  Monitor and dispute intrusive thoughts that trend toward resentment (e.g., already decided, turning into a nun) and which would tempt him to decisively cut off the relationship to limit perceived hurt, and dispute thoughts that would sound denigrating if expressed aloud. Amends-making -- Drawing from the clarity of notes provided, will take up comprehending and making amends for  memorable alienating episodes and habits.  Focus on recognition of negative behavior, effects on the offended, insight what his own motivations were, and cogent picture of what he could do more constructively with the same feelings again.  Understood that restored trust from his wife is a choice made in courage, not a guaranteed result, but what he can do to foster it is to remain patient, listen for comprehension not defense, and take opportunities as they come to show reliably congruent words and actions under stress. Other recommendations/advice -- As may be noted above.  Continue to utilize previously learned skills ad lib. Medication compliance -- Maintain medication as prescribed and work faithfully with relevant prescriber(s) if any changes are desired or seem indicated. Crisis service -- Aware of call list and work-in appts.  Call the clinic on-call service, 988/hotline, 911, or present to [facility] or ER if any life-threatening psychiatric crisis. Followup -- No follow-ups on file.  Next scheduled visit with me 02/19/2024.  Next scheduled in this office 02/19/2024.  Lamar Kendall, PhD Dean Kendall, PhD LP Clinical  Psychologist, [facility] [address] [place], [place] [address] [phone]

## 2024-02-19 ENCOUNTER — Ambulatory Visit (INDEPENDENT_AMBULATORY_CARE_PROVIDER_SITE_OTHER): Admitting: Psychiatry

## 2024-02-19 DIAGNOSIS — Z630 Problems in relationship with spouse or partner: Secondary | ICD-10-CM | POA: Diagnosis not present

## 2024-02-19 DIAGNOSIS — R69 Illness, unspecified: Secondary | ICD-10-CM

## 2024-02-19 DIAGNOSIS — F4321 Adjustment disorder with depressed mood: Secondary | ICD-10-CM | POA: Diagnosis not present
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
## 2024-03-01 NOTE — Progress Notes (Incomplete)
 Psychotherapy Progress Note [facility] [name], PhD LP  Patient ID: [name])    MRN: [id number] Therapy format: Individual psychotherapy Date: 02/19/2024      Start: 1:10p     Stop: 2:00p     Time Spent: 50 min Location: In-person   Session narrative (presenting needs, interim history, self-report of stressors and symptoms, applications of prior therapy, status changes, and interventions made in session) Casual mention of caffeine starts a  Taking Cialis now, per urologist, to help with nocturnal urination, helpful.  Wants to correct the record fro joint session last time about his mother -- alleges [name] used to think his mom was cold, but it probably has more to do with her own rejection sensitivity, especially among women, as evidenced by 30 years of not wanting to socialize with others.  Goes into various accounts of her deferring and masking her true feeligns in social encounters and the allegation again she's becoming a nun.  Attempted to draw out why he feels he needs to make points in defense of his mother or ensure that Tx can see her as damaged by her own family history, but unable to get him to focus enough on the question.    Therapeutic modalities: {[id number]::Cognitive Behavioral Therapy,Solution-Oriented/Positive Psychology}  Mental Status/Observations:  Appearance:   Casual     Behavior:  Minimizing  Motor:  Restlestness  Speech/Language:   Impressionistic at times  Affect:  Appropriate  Mood:  anxious and discontent  Thought process:  tangential  Thought content:    WNL  Sensory/Perceptual disturbances:    WNL  Orientation:  Fully oriented  Attention:  Good    Concentration:  Fair  Memory:  WNL  Insight:    Fair  Judgment:   Fair  Impulse Control:  Good   Risk Assessment: Danger to Self: No Self-injurious Behavior: No Danger to Others: No Physical Aggression / Violence: No Duty to Warn: No Access to Firearms a  concern: No  Assessment of progress:  stabilized  Diagnosis: No diagnosis found. Plan:  Terms of therapy -- Hybrid individual/marital, schedule TBD.  At discretion, [name] may continue her therapy with my former colleague.  Option to ROI to confer, but written notes provided are very helpful and clear to help inform individual work.  Terms of separation/divorce -- Endorse separate bedrooms for now, in order to help keep it clear to [name] he is in a remedial stage of making amends and courting, understood no guarantee of continuing the marriage, but in the course of a faithful try.  No pressure to renew physical affection, but nonsexual interaction allowable at [facility] discretion.  Understood to be assessing what growth can happen to make it worth it vs not worth it to [name]. Improving pro-marriage behavior -- Continue efforts in patient listening, turn taking, shared quality time, and empathetic inquiry.  Monitor and dispute intrusive thoughts that trend toward resentment (e.g., already decided, turning into a nun) and which would tempt him to decisively cut off the relationship to limit perceived hurt, and dispute thoughts that would sound denigrating if expressed aloud. Amends-making -- Drawing from the clarity of notes provided, will take up comprehending and making amends for memorable alienating episodes and habits.  Focus on recognition of negative behavior, effects on the offended, insight what his own motivations were, and cogent picture of what he could do more constructively with the same feelings again.  Understood that restored trust from his wife is a choice made  in courage, not a guaranteed result, but what he can do to foster it is to remain patient, listen for comprehension not defense, and take opportunities as they come to show reliably congruent words and actions under stress. Other recommendations/advice -- As may be noted above.  Continue to utilize previously learned skills ad  lib. Medication compliance -- Maintain medication as prescribed and work faithfully with relevant prescriber(s) if any changes are desired or seem indicated. Crisis service -- Aware of call list and work-in appts.  Call the clinic on-call service, 988/hotline, 911, or present to [facility] or ER if any life-threatening psychiatric crisis. Followup -- Return for time as already scheduled.  Next scheduled visit with me 03/02/2024.  Next scheduled in this office 03/02/2024.  Lamar Kendall, PhD [name], PhD LP Clinical Psychologist, [facility] [address], [address] [place], [place] [address] [phone]

## 2024-03-02 ENCOUNTER — Ambulatory Visit: Admitting: Psychiatry

## 2024-03-02 DIAGNOSIS — R69 Illness, unspecified: Secondary | ICD-10-CM

## 2024-03-02 DIAGNOSIS — Z630 Problems in relationship with spouse or partner: Secondary | ICD-10-CM | POA: Diagnosis not present

## 2024-03-02 DIAGNOSIS — F4321 Adjustment disorder with depressed mood: Secondary | ICD-10-CM
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
## 2024-03-16 ENCOUNTER — Ambulatory Visit: Admitting: Psychiatry

## 2024-03-16 DIAGNOSIS — R69 Illness, unspecified: Secondary | ICD-10-CM

## 2024-03-16 DIAGNOSIS — F4321 Adjustment disorder with depressed mood: Secondary | ICD-10-CM

## 2024-03-16 DIAGNOSIS — Z630 Problems in relationship with spouse or partner: Secondary | ICD-10-CM | POA: Diagnosis not present

## 2024-03-16 NOTE — Progress Notes (Signed)
 Psychotherapy Progress Note [facility] [name], PhD LP  Patient ID: [name])    MRN: [id number] Therapy format: Family therapy w/ patient -- accompanied by [name] Date: 03/16/2024      Start: 9:14a     Stop: 10:11a     Time Spent: 57 min Location: In-person   Session narrative (presenting needs, interim history, self-report of stressors and symptoms, applications of prior therapy, status changes, and interventions made in session) [name] has been working on his writing assignment, doing a lot of rewriting and questioning himself, has caught himself starting to make excuses and stopped himself, as noticed and validated by [name].  [name] relates an episode where [name] was questioning again granting her access to funds and uttered some animosity about her [name], resisted the idea of proceeding on a collaborative divorce, and threatened adversarial actions.  She felt somewhat threatened but to her own credit kept assertive and even tempered in response.  She reports that minutes later, he brought her the financial info, with no further resistance or any sign of reluctance.  Also reports that his letter, which he reports working some hours over rereading, editing, and trying to get how it would come across, was very helpful and validating to her, and he seemed to truly get the pain caused in remembered outbursts and inattention.  Confirmed as validation of his efforts.  As for the lawyer comment, [name] sincerely reiterates his regret for saying that, without trying to justify it, and [name] downplays any emotional reaction to it, denies it is a nail in the coffin of her willingness to continue working with him renovating how they relate to each other.  Obviously not ready to stop sleeping separately, but also confirms they continue to spend -- and she to enjoy -- quality time together.  Facilitated [name] asking after his own interests and asking something closer to  help me understand questions, when [name] reveals, well into the session that her attorney is sending a letter to him, expected to arrive today.  Processed the impact of that and commended to further dialogue at home with time running short.  Also addressed briefly whether his testosterone  supplement may in any way predispose him to quick reactions or temper.  Says he's been on it for 18 yrs, actually, based on a medically measured testosterone  deficiency, and neither of them indicate feeling it is a factor in his past or present reactions.  Continue to hypothesize, then, that his vulnerabilities are more squarely about a chronic stress syndrome and performance pressures.  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  Specifically, notes he reacted too quickly to frustrations, acted too quickly trying to fix or contain perceived problems, and delegated tasks to [name] as if she were a direct report Geophysicist/field seismologist.  Commended on the insight and acknowledgment.  Therapeutic modalities: Cognitive Behavioral Therapy and Solution-Oriented/Positive Psychology  Mental Status/Observations:  Appearance:   Casual     Behavior:  Appropriate  Motor:  Less restless  Speech/Language:   Clear and Coherent  Affect:  Appropriate  Mood:  concerned  Thought process:  normal  Thought content:    WNL  Sensory/Perceptual disturbances:    WNL  Orientation:  Fully oriented  Attention:  Good    Concentration:  Good  Memory:  WNL  Insight:    Good  Judgment:   Good  Impulse Control:  Good   Risk Assessment: Danger to Self: No Self-injurious Behavior: No Danger to Others: No Physical Aggression / Violence: No Duty to Warn: No Access to Firearms a concern: No  Assessment of progress:  progressing  Diagnosis:   ICD-10-CM   1. Adjustment disorder with  depressed mood  F43.21     2. Relationship problem between partners  Z63.0     3. r/o GAD  R69      Plan:  Terms of therapy -- Hybrid individual/marital, schedule TBD.  At discretion, [name] may continue her therapy with my former colleague.  Option to ROI to confer, but written notes provided are very helpful and clear to help inform individual work.  Terms of separation/divorce -- Endorse separate bedrooms for now, in order to help keep it clear to [name] he is in a remedial stage of making amends and courting, and in order to reassure any too-vulnerable feelings [name] may still have.  Understood no guarantee of continuing the marriage, but they are in the course of a faithful try.  No pressure to renew physical affection, but nonsexual interaction allowable at [facility] discretion.  Understood to be assessing what growth in handling frustration and demonstrating respect can happen to make it worth it (or not) to [name] to continue her own consent to be married. Improving pro-marriage behavior -- Continue efforts in shared quality time (e.g., dinner, watching The Chosen) and positive communication skills including patient listening, turn taking, and empathetic inquiry in place of defensiveness.  Monitor and dispute intrusive thoughts that trend toward resentment (e.g., she's already decided, turning into a nun) and which would tempt him to decisively cut off relationship to limit perceived hurt, and dispute thoughts that would sound denigrating if expressed aloud. Amends-making -- Drawing from the clarity of notes provided, will take up comprehending and making amends for memorable alienating episodes and habits.  Focus on recognition of negative behavior, effects on the offended, insight what his own motivations were, and cogent picture of what he could do more constructively with the same feelings again.  Understood that restored trust from his wife is a choice made in courage, not a guaranteed result, but  what he can do to foster it is to remain patient, listen for comprehension not defense, and take opportunities as they come to show reliably congruent words and actions under stress. Other recommendations/advice -- As may be noted above.  Continue to utilize previously learned skills ad lib. Medication compliance -- Maintain medication as prescribed and work faithfully with relevant prescriber(s) if any changes are desired or seem indicated. Crisis service -- Aware of call list and work-in appts.  Call the clinic on-call service, 988/hotline, 911, or present to [facility] or ER if any life-threatening psychiatric crisis. Followup -- Return for time as already scheduled.  Next scheduled visit with me 03/30/2024.  Next scheduled in this office 03/30/2024.  Lamar Kendall, PhD [name], PhD LP Clinical Psychologist, [facility] [address] [place], [place] [address] [phone]

## 2024-03-30 ENCOUNTER — Ambulatory Visit: Admitting: Psychiatry

## 2024-03-30 DIAGNOSIS — Z630 Problems in relationship with spouse or partner: Secondary | ICD-10-CM | POA: Diagnosis not present

## 2024-03-30 DIAGNOSIS — R69 Illness, unspecified: Secondary | ICD-10-CM | POA: Diagnosis not present

## 2024-03-30 DIAGNOSIS — F4321 Adjustment disorder with depressed mood: Secondary | ICD-10-CM | POA: Diagnosis not present

## 2024-03-30 NOTE — Progress Notes (Signed)
 Psychotherapy Progress Note [facility] Dean Kendall, PhD LP  Patient ID: [name])    MRN: [id number] Therapy format: Individual psychotherapy Date: 03/30/2024      Start: 9:13a     Stop: 9:59a     Time Spent: 46 min Location: In-person   Session narrative (presenting needs, interim history, self-report of stressors and symptoms, applications of prior therapy, status changes, and interventions made in session) Did share letter with [name], had some heart to heart conversation, was able to feed back to her that she gives him double messages, affirm how much they seem to enjoy each other's company (at least when not embroiled in conflict).  Constructively asked questions and probed about resentment, which he's been reading on.  She gave him a huge hug, actually, over the letter, but then she's asking the next day about his attorney, because hers needs to send something.  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  Suggested he refocus their conversation and ask at this point how much of what she wanted him to get has he gotten yet, and what, if anything more, would she need to know, or see, to be interested in continuing the marriage and recovery as opposed to following through with formal separation plans.  New hx offered of deep depression as a young man being broken up with.  20 yrs ago at age 73, son [name] Print production planner) manifested bipolar disorder, enough to be curled up in the driveway and needed to be taken to a facility in [place] .  Hx of being stuck with a psychiatrist who never changed meds and he was locked up mentally and emotionally until a change of provider.  He is now living locally, working stably as a Nutritional therapist, and has a son of his own, in a shared custody arrangement.  2nd son [name] was overshadowed by care needs of [name], no alleged problems brought by him but a point of care and concern for  him.  6 years ago was in a hallucinatory depressed state from pain and sleep deprivation until his spinal stenosis got addressed more satisfactorily, which he credits almost entirely to aggressive exercise, as an orthopedist had ruled out surgery and consigned him to working his way to a wheelchair.    Therapeutic modalities: Cognitive Behavioral Therapy, Solution-Oriented/Positive Psychology, and Ego-Supportive  Mental Status/Observations:  Appearance:   Casual     Behavior:  Appropriate  Motor:  Restlestness  Speech/Language:   Clear and Coherent  Affect:  Appropriate  Mood:  Somewhat tense  Thought process:  normal  Thought content:    WNL  Sensory/Perceptual disturbances:    WNL  Orientation:  Fully oriented  Attention:  Good    Concentration:  Fair  Memory:  WNL  Insight:    Good  Judgment:   Good  Impulse Control:  Good   Risk Assessment: Danger to Self: No Self-injurious Behavior: No Danger to Others: No Physical Aggression / Violence: No Duty to Warn: No Access to Firearms a concern: No  Assessment of progress:  progressing  Diagnosis:   ICD-10-CM   1. Adjustment disorder with depressed mood  F43.21     2. Relationship problem between partners  Z63.0     3. r/o GAD, h/o MDD  R69      Plan: Terms of therapy -- Hybrid individual/marital, schedule TBD.  At discretion, [name] may continue her therapy with my former colleague.  Option to ROI to confer, but written notes provided are very helpful and clear to help inform individual work.  Terms of separation/divorce -- Endorse separate bedrooms for now, in order to help keep it clear to [name] he is in a remedial stage of making amends and courting, and in order to reassure any too-vulnerable feelings [name] may still have.  Understood no guarantee of continuing the marriage, but they are in the course of a faithful try.  No pressure to renew physical affection, but nonsexual interaction allowable at [facility] discretion.   Understood to be assessing what growth in handling frustration and demonstrating respect can happen to make it worth it (or not) to [name] to continue her own consent to be married. Improving pro-marriage behavior -- Continue efforts in shared quality time (e.g., dinner, watching The Chosen) and positive communication skills including patient listening, turn taking, and empathetic inquiry in place of defensiveness.  Monitor and dispute intrusive thoughts that trend toward resentment (e.g., she's already decided, turning into a nun) and which would tempt him to decisively cut off relationship to limit perceived hurt, and dispute thoughts that would sound denigrating if expressed aloud. Amends-making -- Drawing from the clarity of notes provided, will take up comprehending and making amends for memorable alienating episodes and habits.  Focus on recognition of negative behavior, effects on the offended, insight what his own motivations were, and cogent picture of what he could do more constructively with the same feelings again.  Understood that restored trust from his wife is a choice made in courage, not a guaranteed result, but what he can do to foster it is to remain patient, listen for comprehension not defense, and take opportunities as they come to show reliably congruent words and actions under stress. Other recommendations/advice -- As may be noted above.  Continue to utilize previously learned skills ad lib. Medication compliance -- Maintain medication as prescribed and work faithfully with relevant prescriber(s) if any changes are desired or seem indicated. Crisis service -- Aware of call list and work-in appts.  Call the clinic on-call service, 988/hotline, 911, or present to [facility] or ER if any life-threatening psychiatric crisis. Followup -- Return for time as already scheduled.  Next scheduled visit with me 04/13/2024.  Next scheduled in this office 04/13/2024.  [name] Kendall, PhD Dean Kendall, PhD  LP Clinical Psychologist, [facility] [address], [address] [place], [place] [address] [phone]

## 2024-04-13 ENCOUNTER — Ambulatory Visit: Admitting: Psychiatry

## 2024-04-13 DIAGNOSIS — F4321 Adjustment disorder with depressed mood: Secondary | ICD-10-CM

## 2024-04-13 DIAGNOSIS — Z630 Problems in relationship with spouse or partner: Secondary | ICD-10-CM

## 2024-04-13 DIAGNOSIS — E349 Endocrine disorder, unspecified: Secondary | ICD-10-CM

## 2024-04-13 DIAGNOSIS — R69 Illness, unspecified: Secondary | ICD-10-CM

## 2024-04-13 DIAGNOSIS — M48061 Spinal stenosis, lumbar region without neurogenic claudication: Secondary | ICD-10-CM

## 2024-04-13 NOTE — Progress Notes (Unsigned)
 Psychotherapy Progress Note [facility] [name], PhD LP  Patient ID: [name])    MRN: [id number] Therapy format: Individual psychotherapy Date: 04/13/2024      Start: 10:15a     Stop: 11:02a     Time Spent: 47 min  Location: In-person   Session narrative (presenting needs, interim history, self-report of stressors and symptoms, applications of prior therapy, status changes, and interventions made in session) Reiterates hx of severe spinal stenosis leaving him couchbound for 6 months, with prognosis of cane-walker-wheelchair status.  Testament to his aggressive PT, gym work, and a use-it-or-lose-it approach that he achieved and remains much more mobile than that, though he characterizes it as daily work preserving the strength needed.  Went through such a bad patch of pain and sleep loss that he began to have hallucinations.  At this point, he can get alarmed about relapsing if he feels a twinge.  Confirms he does share that with [place].  Life together lately seems to be more shared still, with a few weeks now of no mentions of attorneys.  Has not retained an attorney yet, and [name] has not voiced any further developments in legal work or looking for   Therapeutic modalities: {AM:23362::Cognitive Behavioral Therapy,Solution-Oriented/Positive Psychology}  Mental Status/Observations:  Appearance:   {PSY:22683}     Behavior:  {PSY:21022743}  Motor:  {PSY:22302}  Speech/Language:   {PSY:22685}  Affect:  {PSY:22687}  Mood:  {PSY:31886}  Thought process:  {EDB:68111}  Thought content:    {PSY:[id number]}  Sensory/Perceptual disturbances:    {PSY:364-503-0548}  Orientation:  {Psych Orientation:23301::Fully oriented}  Attention:  {Good-Fair-Poor ratings:23770::Good}    Concentration:  {Good-Fair-Poor ratings:23770::Good}  Memory:  {PSY:209 192 2196}  Insight:    {Good-Fair-Poor ratings:23770::Good}  Judgment:   {Good-Fair-Poor  ratings:23770::Good}  Impulse Control:  {Good-Fair-Poor ratings:23770::Good}   Risk Assessment: Danger to Self: {Risk:22599::No} Self-injurious Behavior: {Risk:22599::No} Danger to Others: {Risk:22599::No} Physical Aggression / Violence: {Risk:22599::No} Duty to Warn: {AMYesNo:22526::No} Access to Firearms a concern: {AMYesNo:22526::No}  Assessment of progress:  {Progress:22147::progressing}  Diagnosis:   ICD-10-CM   1. Adjustment disorder with depressed mood  F43.21     2. Relationship problem between partners  Z63.0     3. r/o GAD, h/o MDD  R69    also r/o form of PTSD    4. Spinal stenosis of lumbar region, unspecified whether neurogenic claudication present  M48.061     5. Testosterone  deficiency  E34.9      Plan:  Terms of therapy -- Hybrid individual/marital, schedule TBD.  At discretion, [name] may continue her therapy with my former colleague.  Option to ROI to confer, but written notes provided are very helpful and clear to help inform individual work.  Terms of separation/divorce -- Endorse separate bedrooms for now, in order to help keep it clear to [name] he is in a remedial stage of making amends and courting, and in order to reassure any too-vulnerable feelings [name] may still have.  Understood no guarantee of continuing the marriage, but they are in the course of a faithful try.  No pressure to renew physical affection, but nonsexual interaction allowable at [facility] discretion.  Understood to be assessing what growth in handling frustration and demonstrating respect can happen to make it worth it (or not) to Ruma to continue her own consent to be married. Improving pro-marriage behavior -- Continue efforts in shared quality time (e.g., dinner, watching The Chosen) and positive communication skills including patient listening, turn taking, and empathetic inquiry in place of  defensiveness.  Monitor and dispute intrusive thoughts that trend toward resentment  (e.g., she's already decided, turning into a nun) and which would tempt him to decisively cut off relationship to limit perceived hurt, and dispute thoughts that would sound denigrating if expressed aloud. Amends-making -- Drawing from the clarity of notes provided, will take up comprehending and making amends for memorable alienating episodes and habits.  Focus on recognition of negative behavior, effects on the offended, insight what his own motivations were, and cogent picture of what he could do more constructively with the same feelings again.  Understood that restored trust from his wife is a choice made in courage, not a guaranteed result, but what he can do to foster it is to remain patient, listen for comprehension not defense, and take opportunities as they come to show reliably congruent words and actions under stress. Other recommendations/advice -- As may be noted above.  Continue to utilize previously learned skills ad lib. Medication compliance -- Maintain medication as prescribed and work faithfully with relevant prescriber(s) if any changes are desired or seem indicated. Crisis service -- Aware of call list and work-in appts.  Call the clinic on-call service, 988/hotline, 911, or present to [facility] or ER if any life-threatening psychiatric crisis. Followup -- No follow-ups on file.  Next scheduled visit with me 04/27/2024.  Next scheduled in this office 04/27/2024.  Lamar Kendall, PhD [name], PhD LP Clinical Psychologist, [facility] [address], [address] [place], [place] [address] [phone]

## 2024-04-27 ENCOUNTER — Ambulatory Visit: Admitting: Psychiatry

## 2024-04-27 DIAGNOSIS — R69 Illness, unspecified: Secondary | ICD-10-CM

## 2024-04-27 DIAGNOSIS — Z630 Problems in relationship with spouse or partner: Secondary | ICD-10-CM | POA: Diagnosis not present

## 2024-04-27 DIAGNOSIS — F4321 Adjustment disorder with depressed mood: Secondary | ICD-10-CM | POA: Diagnosis not present

## 2024-04-27 NOTE — Progress Notes (Unsigned)
 Psychotherapy Progress Note [facility] [name], PhD LP  Patient ID: [name])    MRN: [id number] Therapy format: {Therapy Types:21967::Individual psychotherapy} Date: 04/27/2024      Start: ***:***     Stop: ***:***     Time Spent: *** min Location: {SvcLoc:22530::In-person}   Session narrative (presenting needs, interim history, self-report of stressors and symptoms, applications of prior therapy, status changes, and interventions made in session) 2 wks since last seen, at which time he reported warmer dealings with [name], no noted next steps in legal separation, more hopeful, though uncertain what to count on.  Otherwise, continue as best friends, who have interest in each other as people, might, and continues aggressive workouts to maintain against spinal stenosis, with which he has a dark history of sleep loss and even hallucination, strong enough to possibly count as a traumatic event powering anxiety and past snappishness toward her.  Today, brings [name], who asserts things are going appreciably better at home.  Affirms his sincere interest learning about [name] faith, curiosity and openness, credits being out of corporate life, less hardass and more connection, kinder and more considerate since about March.  Also, importantly, more interested in her and her thoughts.  Processed how she had lost respect for him at a time when he seemed to take pleasure in deriding her but affirms he has won a good bit back.  Then she turns to query whether he ever contacted a collaborative attorney, as she had asked, and says she's in a weird place, caught between one plan to reconcile and one plan to divorce.  Boldly asks if he was trying to manipulate her by footdragging, and through   Also brings up a concern that she may not be able to recover sexual willingness, between physical changes she understands to be  Therapeutic modalities:  {AM:23362::Cognitive Behavioral Therapy,Solution-Oriented/Positive Psychology}  Mental Status/Observations:  Appearance:   {PSY:22683}     Behavior:  {PSY:21022743}  Motor:  {PSY:22302}  Speech/Language:   {PSY:22685}  Affect:  {PSY:22687}  Mood:  {PSY:31886}  Thought process:  {PSY:31888}  Thought content:    {PSY:928-212-5119}  Sensory/Perceptual disturbances:    {[phone]}  Orientation:  {Psych Orientation:23301::Fully oriented}  Attention:  {Good-Fair-Poor ratings:23770::Good}    Concentration:  {Good-Fair-Poor ratings:23770::Good}  Memory:  {PSY:705-178-6359}  Insight:    {Good-Fair-Poor ratings:23770::Good}  Judgment:   {Good-Fair-Poor ratings:23770::Good}  Impulse Control:  {Good-Fair-Poor ratings:23770::Good}   Risk Assessment: Danger to Self: {Risk:22599::No} Self-injurious Behavior: {Risk:22599::No} Danger to Others: {Risk:22599::No} Physical Aggression / Violence: {Risk:22599::No} Duty to Warn: {AMYesNo:22526::No} Access to Firearms a concern: {AMYesNo:22526::No}  Assessment of progress:  {Progress:22147::progressing}  Diagnosis: No diagnosis found. Plan:  *** Other recommendations/advice -- As may be noted above.  Continue to utilize previously learned skills ad lib. Medication compliance -- Maintain medication as prescribed and work faithfully with relevant prescriber(s) if any changes are desired or seem indicated. Crisis service -- Aware of call list and work-in appts.  Call the clinic on-call service, 988/hotline, 911, or present to [facility] or ER if any life-threatening psychiatric crisis. Followup -- No follow-ups on file.  Next scheduled visit with me 05/11/2024.  Next scheduled in this office 05/11/2024.  Lamar Kendall, PhD [name], PhD LP Clinical Psychologist, [facility] [address] [place], [place] [address] [phone]

## 2024-05-11 ENCOUNTER — Ambulatory Visit: Admitting: Psychiatry

## 2024-05-11 DIAGNOSIS — F4321 Adjustment disorder with depressed mood: Secondary | ICD-10-CM | POA: Diagnosis not present

## 2024-05-11 DIAGNOSIS — E349 Endocrine disorder, unspecified: Secondary | ICD-10-CM

## 2024-05-11 DIAGNOSIS — M48061 Spinal stenosis, lumbar region without neurogenic claudication: Secondary | ICD-10-CM

## 2024-05-11 DIAGNOSIS — Z630 Problems in relationship with spouse or partner: Secondary | ICD-10-CM | POA: Diagnosis not present

## 2024-05-11 DIAGNOSIS — R69 Illness, unspecified: Secondary | ICD-10-CM
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
## 2024-05-25 ENCOUNTER — Ambulatory Visit: Admitting: Psychiatry

## 2024-05-25 DIAGNOSIS — M48061 Spinal stenosis, lumbar region without neurogenic claudication: Secondary | ICD-10-CM | POA: Diagnosis not present

## 2024-05-25 DIAGNOSIS — F4321 Adjustment disorder with depressed mood: Secondary | ICD-10-CM

## 2024-05-25 DIAGNOSIS — R69 Illness, unspecified: Secondary | ICD-10-CM | POA: Diagnosis not present

## 2024-05-25 DIAGNOSIS — Z630 Problems in relationship with spouse or partner: Secondary | ICD-10-CM | POA: Diagnosis not present
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
## 2024-06-08 ENCOUNTER — Ambulatory Visit: Admitting: Psychiatry

## 2024-06-08 DIAGNOSIS — R69 Illness, unspecified: Secondary | ICD-10-CM | POA: Diagnosis not present

## 2024-06-08 DIAGNOSIS — Z630 Problems in relationship with spouse or partner: Secondary | ICD-10-CM

## 2024-06-08 DIAGNOSIS — F4321 Adjustment disorder with depressed mood: Secondary | ICD-10-CM | POA: Diagnosis not present
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
## 2024-06-22 ENCOUNTER — Ambulatory Visit: Admitting: Psychiatry

## 2024-06-22 DIAGNOSIS — R69 Illness, unspecified: Secondary | ICD-10-CM

## 2024-06-22 DIAGNOSIS — Z630 Problems in relationship with spouse or partner: Secondary | ICD-10-CM | POA: Diagnosis not present

## 2024-06-22 DIAGNOSIS — F4321 Adjustment disorder with depressed mood: Secondary | ICD-10-CM | POA: Diagnosis not present

## 2024-06-22 NOTE — Progress Notes (Signed)
 Psychotherapy Progress Note [facility] [name], PhD LP  Patient ID: [name])    MRN: [id number] Therapy format: Individual psychotherapy Date: 06/22/2024      Start: 8:21a     Stop: 9:02a     Time Spent: 39 min Location: In-person   Session narrative (presenting needs, interim history, self-report of stressors and symptoms, applications of prior therapy, status changes, and interventions made in session) Continues to feel OK about the split with [name].  Some wondering if he ever loved her, actually.  Relations with the kids still positive.  Did have a compassionate conversation with [name]'s sister, passing along his concerns that they  Saturday breakfasts continue, including son and grandson Rowland, as well as SIL, but [name] has been abstaining.  Realizations again how free he feels from the old tension, and how much more simple and flexible lifestyle is becoming now.  Continues to follow [employer] and golf actively in retirement.  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    Less mention of seeking another relationship, though he will surely be interested whenever he believes this is final.  Continued to raise the question what if she wakes up after enough time on her own and wants then to try further.  Entirely possible she will remain guarded, or retreat further into (what get described as male incel) friendships, religion as she understands it, and/or alcohol, but on the chance she does not, worth considering, does he know how he wants to respond, given 40+ years as well.  Probed about any resentment on his part, acknowledges he can carry a little, but  staying practical.  First collaborative divorce meeting this week.  Next week Thanksgiving plans Friday, which will involve both of them with at least one of the sons and grandkids.  Foresees no tension or acrimony.  Prompted to not be surprised of she figures out some form of early exit to manage unrevealed feelings.  Therapeutic modalities: Cognitive Behavioral Therapy, Solution-Oriented/Positive Psychology, and Interpersonal  Mental Status/Observations:  Appearance:   Casual     Behavior:  Appropriate  Motor:  Normal, episodic facial twitching  Speech/Language:   Clear and Coherent  Affect:  Appropriate  Mood:  normal  Thought process:  normal and self-explanations  Thought content:    WNL  Sensory/Perceptual disturbances:    WNL  Orientation:  Fully oriented  Attention:  Good    Concentration:  Fair  Memory:  WNL  Insight:    Good  Judgment:   Good  Impulse Control:  Good   Risk Assessment: Danger to Self: No Self-injurious Behavior: No Danger to Others: No Physical Aggression / Violence: No Duty to Warn: No Access to Firearms a concern: No  Assessment of progress:  progressing  Diagnosis:   ICD-10-CM   1. Adjustment disorder with depressed mood  F43.21     2. r/o GAD, h/o MDD  R69     3. Relationship problem between partners  Z63.0      Plan:  Terms of therapy -- Hybrid individual/couple at Pt's discretion.  Understood [name] may continue her therapy with my former colleague, Zell Hoof, or anyone else at her discretion.  Understood also that conjoint  therapy here neither obligates her to the work of [name]'s sessions nor confers any patient rights save the mutual standard of confidentiality explained at the first meeting, which includes the legal exemption for a marital therapist from being called for testimony, should the process of separation and divorce lead to it. Terms of separation/divorce -- At discretion whether they are in a remedial stage of making amends and  courting or enacting separation.  Whichever it may be, endorse nondefensiveness, no pressure to renew physical affection, and transparency in the service of reducing [name]'s nervous arousal and affording the opportunity to see reasonableness and empathy on his part, regardless of whether she wants it.  Understood that [name] may return or not, and that there e may come times when she is again urgent, or confusing, or expecting things that don't make sense to him, despite having chosen divorce as her answer to perceived problems. Working relationship with [name] -- At discretion now whether to date or engage in any fashion, but continue positive communication skills including patient listening, turn taking, and empathetic inquiry in place of defensiveness.  Monitor and dispute intrusive thoughts that trend toward resentment which would tempt him to retribution, and dispute thoughts that would sound denigrating if expressed aloud, mainly now on grounds that it's just not who he means to be.  As needed, may continue to discern and make amends for past neglect, denigration, or alienation.   Working relationships with adult children -- Approach openly, asking clarification wherever needed, and confirming or altering expectations for boundaries on what they discuss, in respect of their mother, and foreseeable gatherings where the estranged parents will appear together.  Endorse their ability to not just make nice but fulfill respect while co-appearing with family and not play out problems with others. Other recommendations/advice -- As may be noted above.  Continue to utilize previously learned skills ad lib. Medication compliance -- Maintain medication as prescribed and work faithfully with relevant prescriber(s) if any changes are desired or seem indicated. Crisis service -- Aware of call list and work-in appts.  Call the clinic on-call service, 988/hotline, 911, or present to [facility] or ER if any life-threatening  psychiatric crisis. Followup -- Return for time as already scheduled.  Next scheduled visit with me 07/06/2024.  Next scheduled in this office 07/06/2024.  Lamar Kendall, PhD [name], PhD LP Clinical Psychologist, [facility] [address] [place], [place] [address] [phone]

## 2024-07-06 ENCOUNTER — Ambulatory Visit: Admitting: Psychiatry

## 2024-07-06 DIAGNOSIS — R69 Illness, unspecified: Secondary | ICD-10-CM | POA: Diagnosis not present

## 2024-07-06 DIAGNOSIS — Z8659 Personal history of other mental and behavioral disorders: Secondary | ICD-10-CM

## 2024-07-06 DIAGNOSIS — F4322 Adjustment disorder with anxiety: Secondary | ICD-10-CM

## 2024-07-06 DIAGNOSIS — Z635 Disruption of family by separation and divorce: Secondary | ICD-10-CM

## 2024-07-06 NOTE — Progress Notes (Signed)
 Psychotherapy Progress Note [facility] [name], PhD LP  Patient ID: [name])    MRN: [id number] Therapy format: Individual psychotherapy Date: 07/06/2024      Start: 8:13a     Stop: 9:01a     Time Spent: 48 min Location: In-person   Session narrative (presenting needs, interim history, self-report of stressors and symptoms, applications of prior therapy, status changes, and interventions made in session) 1st meeting with [name] and both collaborative attorneys today.  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  Thanksgiving Friday gathering did include [name] and her sister [name], most of the time not in the same room, but no particular tensions, everybody easygoing, even dined next to each other without issue.  Realized, upon seeing her after a month, how he never considered during the in-house separation whether he himself wanted to stay married; was just automatically into trying to save the marriage.  Continues to feel relieved of pressures he had not realized.    Notably [name] was more vividly made up, curious why.  As for the meeting, he seems very ready to go with the community property framework, and low risk of anything getting uncivil.    Noted hx of deep depression -- morose, losing weight -- after being broken up with many years ago.  Long time since then, believes he has not been appreciably depressed since.  Adjustment issues to date have really been more about anxiety, but even that he sees less of.  Has picked up pickleball recently and a cardio class, which is challenging him positively in ways his weight routine does not.  Has also noticed plenty of women there, with perspective that he could find someone compatible and interesting in time.  Not rushing.   Again notes having cleaned up clutter at the house, has it feeling more pleasant to live in.  Anticipates sale later on, feels emotionally prepared to do so.  Therapeutic modalities: Cognitive Behavioral Therapy and Solution-Oriented/Positive Psychology  Mental Status/Observations:  Appearance:   Casual     Behavior:  Appropriate  Motor:  Normal  Speech/Language:   Clear and Coherent  Affect:  Appropriate and a few facial twitches  Mood:  normal  Thought process:  normal  Thought content:    WNL  Sensory/Perceptual disturbances:    WNL  Orientation:  Fully oriented  Attention:  Good    Concentration:  Fair  Memory:  WNL  Insight:    Variable  Judgment:   Good  Impulse Control:  Good   Risk Assessment: Danger to Self: No Self-injurious Behavior: No Danger to Others: No Physical Aggression / Violence: No Duty to Warn: No Access to Firearms a concern: No  Assessment of progress:  progressing  Diagnosis:   ICD-10-CM   1. Adjustment disorder with anxiety  F43.22     2. Separated from spouse  Z63.5     3. History of major depression  Z86.59     4. r/o GAD  R69      Plan:  Terms of therapy -- Hybrid individual/couple at Pt's discretion.  Understood [name] may continue her therapy with my former colleague, [name], or anyone else at her discretion.  Understood also that conjoint therapy here neither obligates her to the work of [name]'s sessions nor confers any patient rights save the  mutual standard of confidentiality explained at the first meeting, which includes the legal exemption for a marital therapist from being called for testimony, should the process of separation and divorce lead to it. Terms of separation/divorce -- At discretion whether they are in a remedial stage of making amends and courting or enacting separation.  Whichever it may be, endorse nondefensiveness, no pressure to renew physical affection, and transparency in the service of reducing [name]'s nervous  arousal and affording the opportunity to see reasonableness and empathy on his part, regardless of whether she wants it.  Understood that [name] may return or not, and that there e may come times when she is again urgent, or confusing, or expecting things that don't make sense to him, despite having chosen divorce as her answer to perceived problems. Working relationship with [name] -- At discretion now whether to date or engage in any fashion, but continue positive communication skills including patient listening, turn taking, and empathetic inquiry in place of defensiveness.  Monitor and dispute intrusive thoughts that trend toward resentment which would tempt him to retribution, and dispute thoughts that would sound denigrating if expressed aloud, mainly now on grounds that it's just not who he means to be.  As needed, may continue to discern and make amends for past neglect, denigration, or alienation.   Working relationships with adult children -- Approach openly, asking clarification wherever needed, and confirming or altering expectations for boundaries on what they discuss, in respect of their mother, and foreseeable gatherings where the estranged parents will appear together.  Endorse their ability to not just make nice but fulfill respect while co-appearing with family and not play out problems with others. Other recommendations/advice -- As may be noted above.  Continue to utilize previously learned skills ad lib. Medication compliance -- Maintain medication as prescribed and work faithfully with relevant prescriber(s) if any changes are desired or seem indicated. Crisis service -- Aware of call list and work-in appts.  Call the clinic on-call service, 988/hotline, 911, or present to [facility] or ER if any life-threatening psychiatric crisis. Followup -- No follow-ups on file.  Next scheduled visit with me 07/20/2024.  Next scheduled in this office 07/20/2024.  Dean Kendall, PhD [name], PhD  LP Clinical Psychologist, [facility] [address], [address] [place], [place] [address] [phone]

## 2024-07-20 ENCOUNTER — Ambulatory Visit: Admitting: Psychiatry

## 2024-07-20 DIAGNOSIS — F4322 Adjustment disorder with anxiety: Secondary | ICD-10-CM | POA: Diagnosis not present

## 2024-07-20 DIAGNOSIS — R69 Illness, unspecified: Secondary | ICD-10-CM | POA: Diagnosis not present

## 2024-07-20 DIAGNOSIS — Z635 Disruption of family by separation and divorce: Secondary | ICD-10-CM

## 2024-07-20 DIAGNOSIS — Z8659 Personal history of other mental and behavioral disorders: Secondary | ICD-10-CM | POA: Diagnosis not present

## 2024-07-20 NOTE — Progress Notes (Unsigned)
 Psychotherapy Progress Note [facility] [name], PhD LP  Patient ID: [name])    MRN: [id number] Therapy format: Individual psychotherapy Date: 07/20/2024      Start: 8:19a     Stop: ***:***     Time Spent: *** min Location: In-person   Session narrative (presenting needs, interim history, self-report of stressors and symptoms, applications of prior therapy, status changes, and interventions made in session) Enjoying cardio class and shopping for furniture.  Shows off a nice game table he bought.  Continues to try to make sesne of why it doesn't feel painful separating, contneus to conme back to the relief he feels from tension, implied blame, sensitivities,   Has been asked out 3x by women he's not interested in.  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    Seeing the boys and family, seeing it turn out well that their regard for him hasn't changed, and the warmth and access they have to each other is still good.    Finding that getting along with [name] is going very smoothly through the processes of financial arrangements and collaborative divorce.  Discussed termination of therapy.  Confirmed trust that he is not headed for big risks or adverse developments, prompted considerations of whether there seem at all to be any more surprises to brace for or delicate dealings to be had, and the only such wild crad would be if [place] tires of living alone, feels she's realized some error maybe, and would start seeking to reunite.  Admittedly, that would make him nervous, ostensibly because he would be in the awkward position of maybe declining.  Agreed he could diplomatically propose assessing further in separateness and maybe consulting further in couples therapy, but also valid just to acknowledge that both of them want to be sure they don't repeat past mistakes, whateve they do, compare notes on their visions for how to have a relationship,  and still be free to decide, each, whether they trust the scenario or each other's abilities to relate constructively, process conflict affirmatively, and actually be of interest to each other, not just a convenience at least one of them to the other.    Therapeutic modalities: {AM:23362::Cognitive Behavioral Therapy,Solution-Oriented/Positive Psychology}  Mental Status/Observations:  Appearance:   {PSY:22683}     Behavior:  {PSY:21022743}  Motor:  {PSY:22302}  Speech/Language:   {PSY:22685}  Affect:  {PSY:22687}  Mood:  {PSY:31886}  Thought process:  {PSY:31888}  Thought content:    {PSY:[id number]}  Sensory/Perceptual disturbances:    {PSY:618-095-0970}  Orientation:  {Psych Orientation:23301::Fully oriented}  Attention:  {Good-Fair-Poor ratings:23770::Good}    Concentration:  {Good-Fair-Poor ratings:23770::Good}  Memory:  {PSY:425-527-0049}  Insight:    {Good-Fair-Poor ratings:23770::Good}  Judgment:   {Good-Fair-Poor ratings:23770::Good}  Impulse Control:  {Good-Fair-Poor ratings:23770::Good}   Risk Assessment: Danger to Self: {Risk:22599::No} Self-injurious Behavior: {Risk:22599::No} Danger to Others: {Risk:22599::No} Physical Aggression / Violence: {Risk:22599::No} Duty to Warn: {AMYesNo:22526::No} Access to Firearms a concern: {AMYesNo:22526::No}  Assessment of progress:  {Progress:22147::progressing}  Diagnosis: No diagnosis found. Plan:  *** Other recommendations/advice -- As may be noted above.  Continue to utilize previously learned skills ad lib. Medication compliance -- Maintain medication as prescribed and work faithfully with relevant prescriber(s) if any changes are desired or seem indicated. Crisis service -- Aware of call list and work-in appts.  Call the clinic on-call service, 988/hotline, 911, or present to [facility] or ER if any life-threatening psychiatric crisis. Followup -- No follow-ups on file.  Next scheduled visit  with me  08/03/2024.  Next scheduled in this office 08/03/2024.  Lamar Kendall, PhD [name], PhD LP Clinical Psychologist, [facility] [address] [place], [place] [address] [phone]

## 2024-07-25 NOTE — Progress Notes (Incomplete)
 Psychotherapy Progress Note [facility] [name], PhD LP  Patient ID: [name])    MRN: [id number] Therapy format: Individual psychotherapy Date: 07/20/2024      Start: 8:19a     Stop: 9:07a     Time Spent: 48 min Location: In-person   Session narrative (presenting needs, interim history, self-report of stressors and symptoms, applications of prior therapy, status changes, and interventions made in session) Enjoying cardio class and shopping for furniture.  Shows off photo of a nice game table he bought.  Continues to try to make sesne of why it doesn't feel painful separating, continues to come back to the basic relief he feels from tension, implied blame, sensitivities, and the press to do something, without enough clarity, and with [name] presenting confusingly different faces in the process.  Affirmed again that those are potent relief, so if he feels them, it's realistic.  The questions seem to point back to what he'd like to make of the situation, how he would want to handle it if [name]  does or does not change her mind, and tending to any family needs accommodating to the split.    Says he has been asked out 3x by women he's not interested in.  Presumably validating, but nothing urgent.  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  Seeing the boys and family, seeing it turn out well that their regard for him hasn't changed, and the warmth and access they have to each other is still good.    Finding that getting along with [name] is also going very smoothly through the processes of financial arrangements and collaborative divorce.    Discussed termination of therapy.  Confirmed trust that he is not headed for big risks or adverse developments, prompted considerations of whether there seem at all to be any more surprises to brace for or delicate dealings to be had, and the only such wild crad would be if [name]  tires of living alone, feels she's realized some error maybe, and would start seeking to reunite.  Admittedly, that would make him nervous, ostensibly because he would be in the awkward position of maybe declining.  Agreed he could diplomatically propose assessing further in separateness and maybe consulting further in couples therapy, but also valid just to acknowledge that both of them want to be sure they don't repeat past mistakes, whateve they do, compare notes on their visions for how to have a relationship, and still be free to decide, each, whether they trust the scenario or each other's abilities to relate constructively, process conflict affirmatively, and actually be of interest to each other, not just a convenience at least one of them to the other.    Therapeutic modalities: {AM:23362::Cognitive Behavioral Therapy,Solution-Oriented/Positive Psychology}  Mental Status/Observations:  Appearance:   {PSY:22683}     Behavior:  {PSY:21022743}  Motor:  {PSY:22302}  Speech/Language:   {PSY:22685}  Affect:  {PSY:22687}  Mood:  {PSY:31886}  Thought process:  {PSY:31888}  Thought content:    {[phone]}  Sensory/Perceptual disturbances:    {PSY:820-502-1888}  Orientation:  {Psych Orientation:23301::Fully oriented}  Attention:  {Good-Fair-Poor ratings:23770::Good}    Concentration:  {Good-Fair-Poor ratings:23770::Good}  Memory:  {PSY:606-674-0625}  Insight:    {Good-Fair-Poor ratings:23770::Good}  Judgment:   {Good-Fair-Poor ratings:23770::Good}  Impulse Control:  {Good-Fair-Poor ratings:23770::Good}   Risk Assessment: Danger to Self: {Risk:22599::No} Self-injurious Behavior: {Risk:22599::No} Danger to Others: {Risk:22599::No} Physical Aggression / Violence: {Risk:22599::No} Duty to Warn: {AMYesNo:22526::No} Access to Firearms  a concern: {AMYesNo:22526::No}  Assessment of progress:  {Progress:22147::progressing}  Diagnosis:   ICD-10-CM   1. Adjustment  disorder with anxiety  F43.22     2. Separated from spouse  Z63.5     3. History of major depression  Z86.59     4. r/o GAD  R69      Plan:  Terms of therapy -- Hybrid individual/couple at Pt's discretion.  Understood [name] may continue her therapy with my former colleague, [name], or anyone else at her discretion.  Understood also that conjoint therapy here neither obligates her to the work of [name]'s sessions nor confers any patient rights save the mutual standard of confidentiality explained at the first meeting, which includes the legal exemption for a marital therapist from being called for testimony, should the process of separation and divorce lead to it. Terms of separation/divorce -- At discretion whether they are in a remedial stage of making amends and courting or enacting separation.  Whichever it may be, endorse nondefensiveness, no pressure to renew physical affection, and transparency in the service of reducing [name]'s nervous arousal and affording the opportunity to see reasonableness and empathy on his part, regardless of whether she wants it.  Understood that [name] may return or not, and that there e may come times when she is again urgent, or confusing, or expecting things that don't make sense to him, despite having chosen divorce as her answer to perceived problems. Working relationship with [name] -- At discretion now whether to date or engage in any fashion, but continue positive communication skills including patient listening, turn taking, and empathetic inquiry in place of defensiveness.  Monitor and dispute intrusive thoughts that trend toward resentment which would tempt him to retribution, and dispute thoughts that would sound denigrating if expressed aloud, mainly now on grounds that it's just not who he means to be.  As needed, may continue to discern and make amends for past neglect, denigration, or alienation.   Working relationships with adult children -- Approach  openly, asking clarification wherever needed, and confirming or altering expectations for boundaries on what they discuss, in respect of their mother, and foreseeable gatherings where the estranged parents will appear together.  Endorse their ability to not just make nice but fulfill respect while co-appearing with family and not play out problems with others. Other recommendations/advice -- As may be noted above.  Continue to utilize previously learned skills ad lib. Medication compliance -- Maintain medication as prescribed and work faithfully with relevant prescriber(s) if any changes are desired or seem indicated. Crisis service -- Aware of call list and work-in appts.  Call the clinic on-call service, 988/hotline, 911, or present to [name] or ER if any life-threatening psychiatric crisis. Followup -- Return for time as already scheduled.  Next scheduled visit with me 08/03/2024.  Next scheduled in this office 08/03/2024.  Lamar Kendall, PhD [name], PhD LP Clinical Psychologist, [name] [address], [address] [place], [place] [address] [phone]

## 2024-08-03 ENCOUNTER — Ambulatory Visit: Admitting: Psychiatry

## 2024-08-03 DIAGNOSIS — Z635 Disruption of family by separation and divorce: Secondary | ICD-10-CM | POA: Diagnosis not present

## 2024-08-03 DIAGNOSIS — F4322 Adjustment disorder with anxiety: Secondary | ICD-10-CM

## 2024-08-03 DIAGNOSIS — Z8659 Personal history of other mental and behavioral disorders: Secondary | ICD-10-CM
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
## 2024-08-18 ENCOUNTER — Ambulatory Visit (INDEPENDENT_AMBULATORY_CARE_PROVIDER_SITE_OTHER): Admitting: Psychiatry

## 2024-08-18 DIAGNOSIS — F4322 Adjustment disorder with anxiety: Secondary | ICD-10-CM | POA: Diagnosis not present

## 2024-08-18 DIAGNOSIS — Z635 Disruption of family by separation and divorce: Secondary | ICD-10-CM | POA: Diagnosis not present

## 2024-08-18 DIAGNOSIS — Z8659 Personal history of other mental and behavioral disorders: Secondary | ICD-10-CM
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
## 2024-08-31 ENCOUNTER — Ambulatory Visit: Admitting: Psychiatry

## 2024-09-22 ENCOUNTER — Ambulatory Visit: Admitting: Psychiatry
